# Patient Record
Sex: Male | Born: 1950 | Race: White | Hispanic: No | Marital: Married | Smoking: Never smoker
Health system: Southern US, Community
[De-identification: ages and names within clinical notes are randomized; demographics above are authoritative.]

## PROBLEM LIST (undated history)

## (undated) DIAGNOSIS — Z8719 Personal history of other diseases of the digestive system: Secondary | ICD-10-CM

## (undated) DIAGNOSIS — J189 Pneumonia, unspecified organism: Secondary | ICD-10-CM

## (undated) DIAGNOSIS — Z9189 Other specified personal risk factors, not elsewhere classified: Secondary | ICD-10-CM

## (undated) DIAGNOSIS — F419 Anxiety disorder, unspecified: Secondary | ICD-10-CM

## (undated) DIAGNOSIS — H269 Unspecified cataract: Secondary | ICD-10-CM

## (undated) DIAGNOSIS — L309 Dermatitis, unspecified: Secondary | ICD-10-CM

## (undated) DIAGNOSIS — J101 Influenza due to other identified influenza virus with other respiratory manifestations: Secondary | ICD-10-CM

## (undated) DIAGNOSIS — Z87442 Personal history of urinary calculi: Secondary | ICD-10-CM

## (undated) DIAGNOSIS — H9190 Unspecified hearing loss, unspecified ear: Secondary | ICD-10-CM

## (undated) DIAGNOSIS — M1990 Unspecified osteoarthritis, unspecified site: Secondary | ICD-10-CM

## (undated) DIAGNOSIS — E785 Hyperlipidemia, unspecified: Secondary | ICD-10-CM

## (undated) DIAGNOSIS — T7840XA Allergy, unspecified, initial encounter: Secondary | ICD-10-CM

## (undated) DIAGNOSIS — R600 Localized edema: Secondary | ICD-10-CM

## (undated) DIAGNOSIS — S61209A Unspecified open wound of unspecified finger without damage to nail, initial encounter: Secondary | ICD-10-CM

## (undated) DIAGNOSIS — Z972 Presence of dental prosthetic device (complete) (partial): Secondary | ICD-10-CM

## (undated) DIAGNOSIS — N201 Calculus of ureter: Secondary | ICD-10-CM

## (undated) DIAGNOSIS — S99912A Unspecified injury of left ankle, initial encounter: Secondary | ICD-10-CM

## (undated) DIAGNOSIS — R21 Rash and other nonspecific skin eruption: Secondary | ICD-10-CM

## (undated) HISTORY — PX: FINGER FRACTURE SURGERY: SHX638

## (undated) HISTORY — DX: Hyperlipidemia, unspecified: E78.5

## (undated) HISTORY — PX: COLONOSCOPY: SHX174

## (undated) HISTORY — DX: Anxiety disorder, unspecified: F41.9

## (undated) HISTORY — DX: Allergy, unspecified, initial encounter: T78.40XA

## (undated) HISTORY — PX: ROTATOR CUFF REPAIR: SHX139

## (undated) HISTORY — PX: NASAL FRACTURE SURGERY: SHX718

## (undated) HISTORY — DX: Unspecified cataract: H26.9

## (undated) HISTORY — PX: ORIF ULNAR FRACTURE: SHX5417

## (undated) HISTORY — DX: Dermatitis, unspecified: L30.9

## (undated) HISTORY — PX: JOINT REPLACEMENT: SHX530

---

## 1998-12-26 ENCOUNTER — Encounter: Payer: Self-pay | Admitting: Family Medicine

## 1998-12-26 ENCOUNTER — Inpatient Hospital Stay (HOSPITAL_COMMUNITY): Admission: EM | Admit: 1998-12-26 | Discharge: 1998-12-27 | Payer: Self-pay | Admitting: Emergency Medicine

## 2001-04-01 ENCOUNTER — Encounter: Admission: RE | Admit: 2001-04-01 | Discharge: 2001-05-04 | Payer: Self-pay | Admitting: Family Medicine

## 2002-09-19 ENCOUNTER — Emergency Department (HOSPITAL_COMMUNITY): Admission: EM | Admit: 2002-09-19 | Discharge: 2002-09-19 | Payer: Self-pay | Admitting: Emergency Medicine

## 2005-09-18 ENCOUNTER — Ambulatory Visit: Payer: Self-pay | Admitting: Family Medicine

## 2005-09-29 ENCOUNTER — Ambulatory Visit: Payer: Self-pay | Admitting: Family Medicine

## 2005-10-23 ENCOUNTER — Ambulatory Visit: Payer: Self-pay | Admitting: Gastroenterology

## 2005-11-06 ENCOUNTER — Encounter (INDEPENDENT_AMBULATORY_CARE_PROVIDER_SITE_OTHER): Payer: Self-pay | Admitting: Specialist

## 2005-11-06 ENCOUNTER — Ambulatory Visit: Payer: Self-pay | Admitting: Gastroenterology

## 2005-11-26 ENCOUNTER — Ambulatory Visit: Payer: Self-pay | Admitting: Family Medicine

## 2005-12-03 ENCOUNTER — Emergency Department (HOSPITAL_COMMUNITY): Admission: EM | Admit: 2005-12-03 | Discharge: 2005-12-03 | Payer: Self-pay | Admitting: Emergency Medicine

## 2006-06-22 ENCOUNTER — Ambulatory Visit: Payer: Self-pay | Admitting: Family Medicine

## 2006-08-18 ENCOUNTER — Ambulatory Visit: Payer: Self-pay | Admitting: Family Medicine

## 2006-09-24 ENCOUNTER — Ambulatory Visit: Payer: Self-pay | Admitting: Family Medicine

## 2006-09-24 LAB — CONVERTED CEMR LAB
ALT: 41 units/L — ABNORMAL HIGH (ref 0–40)
AST: 28 units/L (ref 0–37)
Albumin: 4 g/dL (ref 3.5–5.2)
Alkaline Phosphatase: 45 units/L (ref 39–117)
BUN: 15 mg/dL (ref 6–23)
Bilirubin, Direct: 0.1 mg/dL (ref 0.0–0.3)
Calcium: 8.7 mg/dL (ref 8.4–10.5)
Chloride: 107 meq/L (ref 96–112)
Eosinophils Absolute: 0.3 10*3/uL (ref 0.0–0.6)
Eosinophils Relative: 4.3 % (ref 0.0–5.0)
GFR calc non Af Amer: 82 mL/min
Glucose, Bld: 100 mg/dL — ABNORMAL HIGH (ref 70–99)
HDL: 25.8 mg/dL — ABNORMAL LOW (ref >39.0)
MCV: 93.9 fL (ref 78.0–100.0)
Monocytes Relative: 10.3 % (ref 3.0–11.0)
Platelets: 260 10*3/uL (ref 150–400)
RBC: 4.63 M/uL (ref 4.22–5.81)
TSH: 2.03 microintl units/mL (ref 0.35–5.50)
Triglycerides: 245 mg/dL (ref 0–149)
VLDL: 49 mg/dL — ABNORMAL HIGH (ref 0–40)
WBC: 6.4 10*3/uL (ref 4.5–10.5)

## 2006-10-01 ENCOUNTER — Ambulatory Visit: Payer: Self-pay | Admitting: Family Medicine

## 2006-12-03 ENCOUNTER — Ambulatory Visit: Payer: Self-pay | Admitting: Family Medicine

## 2007-01-27 ENCOUNTER — Ambulatory Visit (HOSPITAL_COMMUNITY): Admission: RE | Admit: 2007-01-27 | Discharge: 2007-01-28 | Payer: Self-pay | Admitting: Orthopedic Surgery

## 2007-01-27 HISTORY — PX: ANTERIOR CERVICAL DECOMP/DISCECTOMY FUSION: SHX1161

## 2007-03-16 ENCOUNTER — Encounter: Admission: RE | Admit: 2007-03-16 | Discharge: 2007-04-04 | Payer: Self-pay | Admitting: Orthopedic Surgery

## 2007-04-12 ENCOUNTER — Telehealth: Payer: Self-pay | Admitting: Family Medicine

## 2007-04-14 ENCOUNTER — Ambulatory Visit: Payer: Self-pay | Admitting: Family Medicine

## 2007-04-15 ENCOUNTER — Ambulatory Visit: Payer: Self-pay | Admitting: Family Medicine

## 2007-06-24 ENCOUNTER — Ambulatory Visit: Payer: Self-pay | Admitting: Family Medicine

## 2007-06-24 DIAGNOSIS — J309 Allergic rhinitis, unspecified: Secondary | ICD-10-CM | POA: Insufficient documentation

## 2007-07-08 ENCOUNTER — Ambulatory Visit: Payer: Self-pay | Admitting: Family Medicine

## 2007-07-08 DIAGNOSIS — F411 Generalized anxiety disorder: Secondary | ICD-10-CM | POA: Insufficient documentation

## 2007-07-08 LAB — CONVERTED CEMR LAB
Albumin: 4.3 g/dL (ref 3.5–5.2)
Alkaline Phosphatase: 45 units/L (ref 39–117)
BUN: 7 mg/dL (ref 6–23)
Basophils Absolute: 0.1 10*3/uL (ref 0.0–0.1)
Bilirubin Urine: NEGATIVE
Blood in Urine, dipstick: NEGATIVE
Chloride: 107 meq/L (ref 96–112)
Cholesterol: 230 mg/dL (ref 0–200)
Creatinine, Ser: 1 mg/dL (ref 0.4–1.5)
Direct LDL: 173 mg/dL
Eosinophils Absolute: 0.3 10*3/uL (ref 0.0–0.6)
GFR calc non Af Amer: 82 mL/min
Glucose, Urine, Semiquant: NEGATIVE
HDL: 32.7 mg/dL — ABNORMAL LOW (ref >39.0)
Ketones, urine, test strip: NEGATIVE
MCHC: 33.9 g/dL (ref 30.0–36.0)
MCV: 95.6 fL (ref 78.0–100.0)
Monocytes Absolute: 0.6 10*3/uL (ref 0.2–0.7)
Monocytes Relative: 7.8 % (ref 3.0–11.0)
Neutrophils Relative %: 73.4 % (ref 43.0–77.0)
PSA: 0.25 ng/mL (ref 0.10–4.00)
Potassium: 4.6 meq/L (ref 3.5–5.1)
Protein, U semiquant: NEGATIVE
RBC: 5.03 M/uL (ref 4.22–5.81)
RDW: 12.5 % (ref 11.5–14.6)
Sodium: 141 meq/L (ref 135–145)
Specific Gravity, Urine: 1.02
Total Bilirubin: 1.2 mg/dL (ref 0.3–1.2)
Total CHOL/HDL Ratio: 7
Triglycerides: 154 mg/dL — ABNORMAL HIGH (ref 0–149)
pH: 6

## 2007-11-08 DIAGNOSIS — N200 Calculus of kidney: Secondary | ICD-10-CM | POA: Insufficient documentation

## 2007-11-10 ENCOUNTER — Ambulatory Visit: Payer: Self-pay | Admitting: Family Medicine

## 2007-11-10 DIAGNOSIS — Z87442 Personal history of urinary calculi: Secondary | ICD-10-CM | POA: Insufficient documentation

## 2007-11-10 LAB — CONVERTED CEMR LAB
Glucose, Urine, Semiquant: NEGATIVE
Ketones, urine, test strip: NEGATIVE
Specific Gravity, Urine: 1.015
Urobilinogen, UA: 0.2
WBC Urine, dipstick: NEGATIVE
pH: 6

## 2007-11-14 LAB — CONVERTED CEMR LAB
Calcium: 9.3 mg/dL (ref 8.4–10.5)
GFR calc Af Amer: 99 mL/min
GFR calc non Af Amer: 82 mL/min
Phosphorus: 3.5 mg/dL (ref 2.3–4.6)
Potassium: 4.4 meq/L (ref 3.5–5.1)
Sodium: 135 meq/L (ref 135–145)
Uric Acid, Serum: 7.2 mg/dL (ref 4.0–7.8)

## 2007-12-01 ENCOUNTER — Ambulatory Visit: Payer: Self-pay | Admitting: Family Medicine

## 2007-12-01 LAB — CONVERTED CEMR LAB
ALT: 34 units/L (ref 0–53)
AST: 27 units/L (ref 0–37)
Albumin: 4.2 g/dL (ref 3.5–5.2)
BUN: 13 mg/dL (ref 6–23)
Basophils Relative: 0.7 % (ref 0.0–3.0)
Blood in Urine, dipstick: NEGATIVE
CO2: 27 meq/L (ref 19–32)
Calcium: 9 mg/dL (ref 8.4–10.5)
Chloride: 104 meq/L (ref 96–112)
Creatinine, Ser: 1.1 mg/dL (ref 0.4–1.5)
Direct LDL: 156.4 mg/dL
Eosinophils Absolute: 0.2 10*3/uL (ref 0.0–0.7)
Eosinophils Relative: 3.3 % (ref 0.0–5.0)
Glucose, Urine, Semiquant: NEGATIVE
HDL: 29.2 mg/dL — ABNORMAL LOW (ref >39.0)
Hemoglobin: 16.2 g/dL (ref 13.0–17.0)
Lymphocytes Relative: 19 % (ref 12.0–46.0)
MCV: 96.4 fL (ref 78.0–100.0)
Neutro Abs: 4.4 10*3/uL (ref 1.4–7.7)
Neutrophils Relative %: 67.7 % (ref 43.0–77.0)
Nitrite: NEGATIVE
RBC: 4.71 M/uL (ref 4.22–5.81)
Specific Gravity, Urine: 1.02
WBC Urine, dipstick: NEGATIVE
WBC: 6.4 10*3/uL (ref 4.5–10.5)

## 2007-12-08 ENCOUNTER — Ambulatory Visit: Payer: Self-pay | Admitting: Family Medicine

## 2007-12-08 DIAGNOSIS — L301 Dyshidrosis [pompholyx]: Secondary | ICD-10-CM | POA: Insufficient documentation

## 2007-12-08 DIAGNOSIS — J45909 Unspecified asthma, uncomplicated: Secondary | ICD-10-CM | POA: Insufficient documentation

## 2008-10-04 ENCOUNTER — Ambulatory Visit: Payer: Self-pay | Admitting: Family Medicine

## 2008-10-19 ENCOUNTER — Ambulatory Visit: Payer: Self-pay | Admitting: Family Medicine

## 2008-12-11 ENCOUNTER — Ambulatory Visit: Payer: Self-pay | Admitting: Family Medicine

## 2008-12-11 LAB — CONVERTED CEMR LAB
ALT: 33 units/L (ref 0–53)
Basophils Relative: 0.2 % (ref 0.0–3.0)
Bilirubin, Direct: 0.1 mg/dL (ref 0.0–0.3)
Chloride: 106 meq/L (ref 96–112)
Creatinine, Ser: 1.1 mg/dL (ref 0.4–1.5)
Eosinophils Relative: 2.6 % (ref 0.0–5.0)
HCT: 44.4 % (ref 39.0–52.0)
MCV: 95.8 fL (ref 78.0–100.0)
Monocytes Absolute: 0.3 10*3/uL (ref 0.1–1.0)
Neutrophils Relative %: 82.3 % — ABNORMAL HIGH (ref 43.0–77.0)
Nitrite: NEGATIVE
PSA: 0.32 ng/mL (ref 0.10–4.00)
Potassium: 4.7 meq/L (ref 3.5–5.1)
Protein, U semiquant: NEGATIVE
RBC: 4.63 M/uL (ref 4.22–5.81)
Sodium: 141 meq/L (ref 135–145)
Total CHOL/HDL Ratio: 6
Total Protein: 7.1 g/dL (ref 6.0–8.3)
Urobilinogen, UA: 0.2
VLDL: 29.6 mg/dL (ref 0.0–40.0)
WBC Urine, dipstick: NEGATIVE
WBC: 8.1 10*3/uL (ref 4.5–10.5)

## 2008-12-17 ENCOUNTER — Ambulatory Visit: Payer: Self-pay | Admitting: Family Medicine

## 2008-12-17 DIAGNOSIS — H833X9 Noise effects on inner ear, unspecified ear: Secondary | ICD-10-CM | POA: Insufficient documentation

## 2009-01-23 ENCOUNTER — Ambulatory Visit: Payer: Self-pay | Admitting: Family Medicine

## 2009-01-23 ENCOUNTER — Ambulatory Visit: Payer: Self-pay | Admitting: Cardiovascular Disease

## 2009-01-23 LAB — CONVERTED CEMR LAB
Blood in Urine, dipstick: NEGATIVE
Ketones, urine, test strip: NEGATIVE
Nitrite: NEGATIVE
Protein, U semiquant: NEGATIVE
Urobilinogen, UA: 1

## 2009-05-06 ENCOUNTER — Ambulatory Visit: Payer: Self-pay | Admitting: Family Medicine

## 2009-06-06 ENCOUNTER — Ambulatory Visit: Payer: Self-pay | Admitting: Family Medicine

## 2009-06-06 ENCOUNTER — Telehealth: Payer: Self-pay | Admitting: Family Medicine

## 2009-08-17 ENCOUNTER — Ambulatory Visit: Payer: Self-pay | Admitting: Family Medicine

## 2009-08-17 ENCOUNTER — Telehealth: Payer: Self-pay | Admitting: Family Medicine

## 2009-08-17 DIAGNOSIS — R319 Hematuria, unspecified: Secondary | ICD-10-CM | POA: Insufficient documentation

## 2009-08-17 LAB — CONVERTED CEMR LAB
Bilirubin Urine: NEGATIVE
Glucose, Urine, Semiquant: NEGATIVE
Ketones, urine, test strip: NEGATIVE
Specific Gravity, Urine: 1.015

## 2009-10-07 ENCOUNTER — Telehealth: Payer: Self-pay | Admitting: Family Medicine

## 2010-01-09 ENCOUNTER — Ambulatory Visit: Payer: Self-pay | Admitting: Family Medicine

## 2010-01-09 LAB — CONVERTED CEMR LAB
ALT: 38 units/L (ref 0–53)
BUN: 13 mg/dL (ref 6–23)
Basophils Absolute: 0.1 10*3/uL (ref 0.0–0.1)
Bilirubin Urine: NEGATIVE
Bilirubin, Direct: 0.2 mg/dL (ref 0.0–0.3)
Blood in Urine, dipstick: NEGATIVE
Chloride: 106 meq/L (ref 96–112)
Cholesterol: 203 mg/dL — ABNORMAL HIGH (ref 0–200)
Creatinine, Ser: 1 mg/dL (ref 0.4–1.5)
Direct LDL: 155.6 mg/dL
Eosinophils Relative: 3.2 % (ref 0.0–5.0)
GFR calc non Af Amer: 82.72 mL/min (ref >60)
Glucose, Bld: 97 mg/dL (ref 70–99)
Glucose, Urine, Semiquant: NEGATIVE
HDL: 39.2 mg/dL (ref >39.00)
Lymphs Abs: 1.6 10*3/uL (ref 0.7–4.0)
MCV: 96.3 fL (ref 78.0–100.0)
Monocytes Absolute: 0.7 10*3/uL (ref 0.1–1.0)
Neutrophils Relative %: 66.7 % (ref 43.0–77.0)
PSA: 0.23 ng/mL (ref 0.10–4.00)
Platelets: 206 10*3/uL (ref 150.0–400.0)
Protein, U semiquant: NEGATIVE
RDW: 13.5 % (ref 11.5–14.6)
Specific Gravity, Urine: 1.025
TSH: 2.38 microintl units/mL (ref 0.35–5.50)
Total Bilirubin: 0.8 mg/dL (ref 0.3–1.2)
VLDL: 25.2 mg/dL (ref 0.0–40.0)
WBC: 8 10*3/uL (ref 4.5–10.5)
pH: 5

## 2010-01-16 ENCOUNTER — Encounter: Payer: Self-pay | Admitting: Family Medicine

## 2010-01-16 ENCOUNTER — Ambulatory Visit: Payer: Self-pay | Admitting: Family Medicine

## 2010-01-31 ENCOUNTER — Ambulatory Visit: Payer: Self-pay | Admitting: Family Medicine

## 2010-05-27 NOTE — Assessment & Plan Note (Signed)
Summary: ALER/REC TO PNEU SHOT//    History of Present Illness: [name] is a 60 year old male, who comes in today for evaluation of soreness in his Om after a Pneumovax injection.  I gave him a Pneumovax in his left arm.  Yesterday.  He said last night.  His arm became red and hot and swollen.  He comes in today for evaluation.  He said Pneumovax injections in the past, but never had a reaction like this before.  Current Allergies: No known allergies       Physical Exam  General:     Well-developed,well-nourished,in no acute distress; alert,appropriate and cooperative throughout examination Skin:     examination her left arm shows erythema of the upper arm, extending down to the elbow.  It is tender and hot    Impression & Recommendations:  Problem # 1:  CELLULITIS AND ABSCESS OF OTHER SPECIFIED SITE [phone]) Assessment: New  Orders: No Charge Patient Arrived (NCPA0) (NCPA0)   Complete Medication List: 1)  Prednisone 20 Mg Tabs (Prednisone) .... Uad 2)  Flonase 50 Mcg/act Susp (Fluticasone propionate) .... Prn 3)  Viagra 50 Mg Tabs (Sildenafil citrate) .... Prn 4)  Adult Aspirin Low Strength 81 Mg Tbdp (Aspirin) 5)  Nostrilla 12 Hour 0.05 % Soln (Oxymetazoline hcl)   Patient Instructions: 1)  begin Keflex 500 mg two tabs b.i.d.  Use warm soaks.  Over the next two to 3 days.  The cellulitis had resolved.  If, however, does not air becomes worse.  It come directly to the hospital.  You might need to be admitted for IV antibiotics.    ]

## 2010-05-27 NOTE — Miscellaneous (Signed)
Summary: Directives and Consents  Directives and Consents   Imported By: Haskell Riling 08/17/2009 15:02:07  _____________________________________________________________________  External Attachment:    Type:   Image     Comment:   External Document

## 2010-05-27 NOTE — Assessment & Plan Note (Signed)
Summary: cpx//ccm   Vital Signs:  Patient profile:   60 year old male Height:      69.25 inches Weight:      240 pounds BMI:     35.31 Temp:     98.4 degrees F oral BP sitting:   130 / 90  (left arm) Cuff size:   regular  Vitals Entered By: Kern Reap CMA Duncan Dull) (January 16, 2010 10:37 AM) CC: cpx Is Patient Diabetic? No Pain Assessment Patient in pain? no        CC:  cpx.  History of Present Illness: [name] is a 60 year old male, married male, nonsmoker, who comes in today for evaluation of multiple issues.  He has a history of underlying allergic rhinitis, for which he uses Flonase nasal spray.  He also has asthma, for which he uses Qvar 41-cup b.i.d. and p.r.n. albuterol.  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  We tried to taper his medication down, but every time we get below 10 mg a day.  His hands cracked bleed.  He's also usingudder cream and white cotton gloves at bedtime.  He said some hearing loss and would like a referral for an audiogram.  Weight is said to 40.  He get his weight down via diet and exercise in the past.  In June.  He had a kidney stone passed spontaneously.  He's had recurrent stones.  History GI care, dental care, colonoscopy, normal, it is 2006, Pneumovax 2009, seasonal flu shot today  Allergies: No Known Drug Allergies  Past History:  Past medical, surgical, family and social histories (including risk factors) reviewed, and no changes noted (except as noted below).  Past Medical History: Reviewed history from 12/08/2007 and no changes required. High Cholesterol Allergic rhinitis Anxiety Nephrolithiasis, hx of Asthma eczema  Past Surgical History: Reviewed history from 01/10/2007 and no changes required. Colonoscopy  Family History: Reviewed history from 01/10/2007 and no changes required. Family History of Arthritis Family History Hypertension Family History Osteoporosis Family History  of Cardiovascular disorder Family History of Respiratory disease  Social History: Reviewed history from 01/10/2007 and no changes required. Occupation: Married Never Smoked Alcohol use-yes  Review of Systems      See HPI       Flu Vaccine Consent Questions     Do you have a history of severe allergic reactions to this vaccine? no    Any prior history of allergic reactions to egg and/or gelatin? no    Do you have a sensitivity to the preservative Thimersol? no    Do you have a past history of Guillan-Barre Syndrome? no    Do you currently have an acute febrile illness? no    Have you ever had a severe reaction to latex? no    Vaccine information given and explained to patient? yes    Are you currently pregnant? no    Lot Number:[id number]   Exp Date:10/25/2010   Site Given  Left Deltoid IM   Physical Exam  General:  Well-developed,well-nourished,in no acute distress; alert,appropriate and cooperative throughout examination Head:  Normocephalic and atraumatic without obvious abnormalities. No apparent alopecia or balding. Eyes:  No corneal or conjunctival inflammation noted. EOMI. Perrla. Funduscopic exam benign, without hemorrhages, exudates or papilledema. Vision grossly normal. Ears:  External ear exam shows no significant lesions or deformities.  Otoscopic examination reveals clear canals, tympanic membranes are intact bilaterally without bulging, retraction, inflammation or discharge. Hearing is grossly normal bilaterally. Nose:  External nasal examination shows no deformity or inflammation. Nasal mucosa are pink and moist without lesions or exudates. Mouth:  Oral mucosa and oropharynx without lesions or exudates.  Teeth in good repair. Neck:  No deformities, masses, or tenderness noted. Chest Wall:  No deformities, masses, tenderness or gynecomastia noted. Breasts:  No masses or gynecomastia noted Lungs:  Normal respiratory effort, chest expands symmetrically. Lungs are clear  to auscultation, no crackles or wheezes. Heart:  Normal rate and regular rhythm. S1 and S2 normal without gallop, murmur, click, rub or other extra sounds. Abdomen:  Bowel sounds positive,abdomen soft and non-tender without masses, organomegaly or hernias noted. Rectal:  No external abnormalities noted. Normal sphincter tone. No rectal masses or tenderness. Genitalia:  Testes bilaterally descended without nodularity, tenderness or masses. No scrotal masses or lesions. No penis lesions or urethral discharge. Prostate:  Prostate gland firm and smooth, no enlargement, nodularity, tenderness, mass, asymmetry or induration. Msk:  No deformity or scoliosis noted of thoracic or lumbar spine.   Pulses:  R and L carotid,radial,femoral,dorsalis pedis and posterior tibial pulses are full and equal bilaterally Extremities:  1+ left pedal edema and 1+ right pedal edema.  1+ left pedal edema.   Neurologic:  No cranial nerve deficits noted. Station and gait are normal. Plantar reflexes are down-going bilaterally. DTRs are symmetrical throughout. Sensory, motor and coordinative functions appear intact. Skin:  Intact without suspicious lesions or rashes Cervical Nodes:  No lymphadenopathy noted Axillary Nodes:  No palpable lymphadenopathy Inguinal Nodes:  No significant adenopathy Psych:  Cognition and judgment appear intact. Alert and cooperative with normal attention span and concentration. 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 2 puffs two times a day    Proair Hfa 108 (90 Base) Mcg/act Aers (Albuterol sulfate) .Marland Kitchen... 2 ps three times a day as needed    Prednisone 10 Mg Tabs (Prednisone) ..... Uad    Prednisone 20 Mg Tabs (Prednisone) ..... Use as directed  His updated medication list for this problem includes:    Qvar 40 Mcg/act Aers (Beclomethasone dipropionate) .Marland Kitchen... 2 puffs two times a day    Proair Hfa 108 (90 Base) Mcg/act Aers (Albuterol sulfate) .Marland Kitchen... 2 ps three times a day as needed    Prednisone 10 Mg Tabs (Prednisone) ..... Uad    Prednisone 20 Mg Tabs (Prednisone) ..... Use as directed  Orders: Prescription Created Electronically [phone])  Problem # 5:  ALLERGIC RHINITIS (ICD-477.9) Assessment: Improved  His updated medication list for this problem includes:    Flonase 50 Mcg/act Susp (Fluticasone propionate) .Marland Kitchen... Prn  His updated medication list for this problem includes:    Flonase 50 Mcg/act Susp (Fluticasone propionate) .Marland Kitchen... Prn  Orders: Prescription Created Electronically [phone])  Complete Medication List: 1)  Flonase 50 Mcg/act Susp (Fluticasone propionate) .... Prn 2)  Adult Aspirin Low Strength 81 Mg Tbdp (Aspirin) 3)  Demerol 50 Mg Tabs (Meperidine hcl) .Marland Kitchen.. 1 or 2 qid as needed pain 4)  Qvar 40 Mcg/act Aers (Beclomethasone dipropionate) .Marland KitchenMarland KitchenMarland Kitchen  2 puffs two times a day 5)  Proair Hfa 108 (90 Base) Mcg/act Aers (Albuterol sulfate) .... 2 ps three times a day as needed 6)  Lidex 0.05 % Crea (Fluocinonide) .... Apply at bedtime 7)  Prednisone 10 Mg Tabs (Prednisone) .... Uad 8)  Tumeric 500  .... Take one tab two times a day 9)  Hydromet 5-1.5 Mg/65ml Syrp (Hydrocodone-homatropine) .Marland Kitchen.. 1 or 2 tsps at bedtime as needed 10)  Alka-seltzer Plus Cold 2-7.8-325 Mg Tbef (Chlorphen-phenyleph-asa) .... As  directed 11)  Tussionex Pennkinetic Er 8-10 Mg/19ml Lqcr (Chlorpheniramine-hydrocodone) 12)  Prednisone 20 Mg Tabs (Prednisone) .... Use as directed  Other Orders: Admin 1st Vaccine ([address]) Flu Vaccine 35yrs + ([address])  Patient Instructions: 1)  Please schedule a follow-up appointment in 1 year. 2)  It is important that you exercise regularly at least 20 minutes 5 times a week. If you develop chest pain, have severe difficulty breathing, or feel very tired , stop exercising immediately and seek medical attention. 3)  You need to lose weight. Consider a lower calorie diet and regular exercise.  4)  Schedule a colonoscopy/sigmoidoscopy to help detect colon cancer. 5)  Take an Aspirin every day.call...Marland KitchenMarland KitchenMarland Kitchen [place] in her nose, and throat to make an appointment to see Aurther Loft.  The  audiologist 6)  try to alternate the prednisone by taking 10 mg one day and 5.  The next Prescriptions: DEMEROL 50 MG  TABS (MEPERIDINE HCL) 1 or 2 qid as needed pain  #30 x 0   Entered and Authorized by:   Roderick Pee MD   Signed by:   Roderick Pee MD on 01/16/2010   Method used:   Print then Give to Patient   RxID:   [id number] PREDNISONE 10 MG TABS (PREDNISONE) UAD  #100 x 3   Entered and Authorized by:   Roderick Pee MD   Signed by:   Roderick Pee MD on 01/16/2010   Method used:   Electronically to        [facility] [phone][facility] (retail)       [address] [place], [place]  [address]       Ph: [phone] or [phone]       Fax: [phone]   RxID:   [id number] PROAIR HFA 108 (90 BASE) MCG/ACT  AERS (ALBUTEROL SULFATE) 2 ps three times a day as needed  #1 x 1   Entered and Authorized by:   Roderick Pee MD   Signed by:   Roderick Pee MD on 01/16/2010   Method used:   Electronically to        [facility]  [address] [phone]* (retail)       [address] [place], [place]  [address]       Ph: [phone] or [phone]       Fax: [phone]   RxID:    [id number] QVAR 40 MCG/ACT  AERS (BECLOMETHASONE DIPROPIONATE) 2 puffs two times a day  #3 x 3   Entered and Authorized by:   Roderick Pee MD   Signed by:   Roderick Pee MD on 01/16/2010   Method used:   [facility]        [facility]  [address] [phone]       [address]  [place], [place]  [address]       Ph: [phone] or [phone]       Fax: [phone]   RxID:   [id number] FLONASE 50 MCG/ACT  SUSP (FLUTICASONE PROPIONATE) PRN  #3 x 3   Entered and Authorized by:   Roderick Pee MD   Signed by:   Roderick Pee MD on 01/16/2010   Method used:   [facility]ron[facility] to        [facility]  [address] [phone]* (retail)       [address] [place], [place]  [address]       Ph: [phone] or [phone]       Fax: [phone]   RxID:   [id number]

## 2010-05-27 NOTE — Assessment & Plan Note (Signed)
Summary: COugh/runny nose - greenish, ear pain - Both, headache x 4 dys r   Vital Signs:  Patient Profile:   60 Years Old Male CC:      Cold & URI symptoms Height:     69 inches Weight:      238 pounds O2 Sat:      97 % O2 treatment:    Room Air Temp:     97.9 degrees F oral Pulse rate:   70 / minute Pulse rhythm:   regular Resp:     15 per minute (right arm) Cuff size:   regular  Vitals Entered By: Areta Haber CMA (August 17, 2009 1:17 PM)                  Prior Medication List:  FLONASE 50 MCG/ACT  SUSP (FLUTICASONE PROPIONATE) PRN ADULT ASPIRIN LOW STRENGTH 81 MG  TBDP (ASPIRIN)  DEMEROL 50 MG  TABS (MEPERIDINE HCL) 1 or 2 qid as needed pain QVAR 40 MCG/ACT  AERS (BECLOMETHASONE DIPROPIONATE) 2 puffs two times a day PROAIR HFA 108 (90 BASE) MCG/ACT  AERS (ALBUTEROL SULFATE) 2 ps three times a day as needed LIDEX 0.05 % CREA (FLUOCINONIDE) apply at bedtime PREDNISONE 10 MG TABS (PREDNISONE) UAD * TUMERIC 500 take one tab two times a day HYDROMET 5-1.5 MG/5ML SYRP (HYDROCODONE-HOMATROPINE) 1 or 2 tsps at bedtime as needed   Current Allergies: No known allergies History of Present Illness Chief Complaint: Cold & URI symptoms History of Present Illness: patient started getting sick on Wednesday. He has been coughing wheezing some SOB. He has felt cold asnd has had had some diarrhea.  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.24) ALLERGIC RHINITIS (ICD-477.9) CELLULITIS AND ABSCESS OF OTHER SPECIFIED SITE [phone]) FAMILY  HISTORY OSTEOPOROSIS (ICD-V17.8) COUGH (ICD-786.2)   Current Meds FLONASE 50 MCG/ACT  SUSP (FLUTICASONE PROPIONATE) PRN ADULT ASPIRIN LOW STRENGTH 81 MG  TBDP (ASPIRIN)  DEMEROL 50 MG  TABS (MEPERIDINE HCL) 1 or 2 qid as needed pain QVAR 40 MCG/ACT  AERS (BECLOMETHASONE DIPROPIONATE) 2 puffs two times a day PROAIR HFA 108 (90 BASE) MCG/ACT  AERS (ALBUTEROL SULFATE) 2 ps three times a day as needed LIDEX 0.05 % CREA (FLUOCINONIDE) apply at bedtime PREDNISONE 10 MG TABS (PREDNISONE) UAD * TUMERIC 500 take one tab two times a day HYDROMET 5-1.5 MG/5ML SYRP (HYDROCODONE-HOMATROPINE) 1 or 2 tsps at bedtime as needed ALKA-SELTZER PLUS COLD 2-7.8-325 MG TBEF (CHLORPHEN-PHENYLEPH-ASA) as directed AUGMENTIN 875-125 MG TABS (AMOXICILLIN-POT CLAVULANATE) 1 by mouth 2 times daily TUSSIONEX PENNKINETIC ER 8-10 MG/5ML LQCR (CHLORPHENIRAMINE-HYDROCODONE)   REVIEW OF SYSTEMS Constitutional Symptoms       Complains of fever.     Denies chills, night sweats, weight loss, weight gain, and fatigue.  Eyes       Denies change in vision, eye pain, eye discharge, glasses, contact lenses, and eye surgery. Ear/Nose/Throat/Mouth       Complains of ear pain and hoarseness.      Denies hearing loss/aids, change in hearing, ear discharge, dizziness, frequent runny nose, frequent nose bleeds, sinus problems, sore throat, and tooth pain or bleeding.      Comments: x 4 dys Respiratory       Complains of productive cough.  Denies dry cough, wheezing, shortness of breath, asthma, bronchitis, and emphysema/COPD.  Cardiovascular       Denies murmurs, chest pain, and tires easily with exhertion.    Gastrointestinal       Denies stomach pain, nausea/vomiting, diarrhea, constipation, blood in bowel movements, and indigestion. Genitourniary       Denies painful urination, kidney stones, and loss of urinary control. Neurological       Complains of headaches.      Denies paralysis, seizures, and  fainting/blackouts. Musculoskeletal       Denies muscle pain, joint pain, joint stiffness, decreased range of motion, redness, swelling, muscle weakness, and gout.  Skin       Denies bruising, unusual mles/lumps or sores, and hair/skin or nail changes.  Psych       Denies mood changes, temper/anger issues, anxiety/stress, speech problems, depression, and sleep problems. Other Comments: greenish. Pt has not seen PCP for this   Past History:  Past Medical History: Last updated: 12/08/2007 High Cholesterol Allergic rhinitis Anxiety Nephrolithiasis, hx of Asthma eczema  Past Surgical History: Last updated: 01/10/2007 Colonoscopy  Family History: Last updated: 01/10/2007 Family History of Arthritis Family History Hypertension Family History Osteoporosis Family History of Cardiovascular disorder Family History of Respiratory disease  Social History: Last updated: 01/10/2007 Occupation: Married Never Smoked Alcohol use-yes  Risk Factors: Smoking Status: never (01/10/2007) Passive Smoke Exposure: no (12/03/2006)  Family History: Reviewed history from 01/10/2007 and no changes required. Family History of Arthritis Family History Hypertension Family History Osteoporosis Family History of Cardiovascular disorder Family History of Respiratory disease  Social History: Reviewed history from 01/10/2007 and no changes required. Occupation: Married Never Smoked Alcohol use-yes Physical Exam General appearance: well developed, well nourished,mild distress Head: normocephalic, atraumatic Oral/Pharynx: tongue normal, posterior pharynx without erythema or exudate Neck: supple,anterior lymphadenopathy present Chest/Lungs: scattered rhonchi poor movement of air Heart: regular rate and  rhythm, no murmur Abdomen: protuberennt  Back: mild  R CVA tenderness Skin: no obvious rashes or lesions MSE: oriented to time, place, and person Assessment New Problems: URINARY TRACT  INFECTION (ICD-599.0) HEMATURIA UNSPECIFIED (ICD-599.70) BRONCHITIS, ACUTE (ICD-466.0)  bronchitis  hematuria  Patient Education: Patient and/or caregiver instructed in the following: rest fluids and Tylenol.  Plan New Medications/Changes: Sandria Senter ER 8-10 MG/5ML LQCR (CHLORPHENIRAMINE-HYDROCODONE)  #4 floz x 0, 08/17/2009, Hassan Rowan MD AUGMENTIN [phone] MG TABS (AMOXICILLIN-POT CLAVULANATE) 1 by mouth 2 times daily  #20 x 0, 08/17/2009, Hassan Rowan MD  New Orders: New Patient Level IV [phone] Nebulizer Tx [phone] UA Dipstick w/o Micro (manual) [81002] T-Culture, Urine [[id number]] Solumedrol up to 125mg  [J2930] Albuterol Sulfate Sol 1mg  unit dose [J7613] Ipratropium inhalation sol. unit dose [H0865] Planning Comments:   as below  Follow Up: Follow up in 2-3 days if no improvement, Follow up with Primary Physician  The patient and/or caregiver has been counseled thoroughly with regard to medications prescribed including dosage, schedule, interactions, rationale for use, and possible side effects and they verbalize understanding.  Diagnoses and expected course of recovery discussed and will return if not improved as expected or if the condition worsens. Patient and/or caregiver verbalized understanding.  Prescriptions: Sandria Senter ER 8-10 MG/5ML LQCR (CHLORPHENIRAMINE-HYDROCODONE)   #4 floz x 0   Entered and Authorized by:   Hassan Rowan MD   Signed by:   Hassan Rowan MD on 08/17/2009   Method used:   Printed then faxed to ...       [facility]  [address] [phone][facility] (retail[facility]  [address] [place]  [address]       Ph: [phone] or [phone]       Fax: [phone]   RxID:   [id number] AUGMENTIN 875-125 MG TABS (AMOXICILLIN-POT CLAVULANATE) 1 by mouth 2 times daily  #20 x 0   Entered and Authorized by:   Hassan Rowan MD   Signed by:   Hassan Rowan MD on 08/17/2009   Method used:   Printed then faxed to ...       [facility]  [address]  [phone]* (retail)       [address] [place]  [address]       Ph: [phone] or [phone]       Fax: [phone]   RxID:   [id number]   Patient Instructions: 1)  Please schedule an appointment with your primary doctor in :7-10 days to make sure hematuria has resolved. unless while straining urine a stone is recovered.  2)  Hematuria needs to be checked on to mae there uis no other serious medical problems. 3)  If pain gets worse a kidney stone may be present and you need to go to the local Ed for futher evaluation. 4)  Take your antibiotic as prescribed until ALL of it is gone, but stop if you develop a rash or swelling and contact our office as soon as possible. 5)  Acute bronchitis symptoms for less than 10 days are not helped by antibiotics. take over the counter cough medications. call if no improvment in  5-7 days, sooner if increasing cough, fever, or new symptoms( shortness of breath, chest pain).  Laboratory Results   Urine Tests  Date/Time Received: August 17, 2009 1:37 PM  Date/Time Reported: August 17, 2009 1:37 PM   Routine Urinalysis   Color: yellow Appearance: Clear Glucose: negative   (Normal Range: Negative) Bilirubin: negative   (Normal Range: Negative) Ketone: negative   (Normal Range: Negative) Spec. Gravity: 1.015   (Normal Range: 1.003-1.035) Blood: small   (Normal Range: Negative) pH: 6.0   (Normal Range: 5.0-8.0) Protein: negative   (Normal Range: Negative) Urobilinogen: 0.2   (Normal Range: 0-1) Nitrite: negative   (Normal Range: Negative) Leukocyte Esterace: negative   (Normal Range: Negative)         Medication Administration  Injection # 1:    Medication: Solumedrol up to 125mg     Diagnosis: BRONCHITIS, ACUTE (ICD-466.0)    Route: IM    Site: LUOQ gluteus    Exp Date: 09/31/2013    Lot #: [id number]    Mfr: Pharmacia    Comments: Administered 125mg     Patient tolerated injection without complications    Given  by: Areta Haber CMA (August 17, 2009 4:00 PM)  Medication # 1:    Medication: Albuterol Sulfate Sol 1mg  unit dose    Diagnosis: BRONCHITIS, ACUTE (ICD-466.0)    Route: inhaled    Exp Date: 12/26/2010    Lot #: [id number]    Mfr: Nephron    Patient tolerated medication without complications    Given by: Areta Haber CMA (August 17, 2009 4:01 PM)  Medication # 2:    Medication: Ipratropium inhalation sol. unit dose    Diagnosis: BRONCHITIS, ACUTE (ICD-466.0)    Route: inhaled    Exp Date: 08/24/2009    Lot #: [id number]    Mfr:  Nephron    Patient tolerated medication without complications    Given by: Areta Haber CMA (August 17, 2009 4:02 PM)  Orders Added: 1)  New Patient Level IV [99204] 2)  Nebulizer Tx [94640] 3)  UA Dipstick w/o Micro (manual) [81002] 4)  T-Culture, Urine [[id number]] 5)  Solumedrol up to 125mg  [J2930] 6)  Albuterol Sulfate Sol 1mg  unit dose [J7613] 7)  Ipratropium inhalation sol. unit dose [X5284]

## 2010-05-27 NOTE — Assessment & Plan Note (Signed)
Summary: DRY,CRACKED,BLEDDING FINGERS   Vital Signs:  Patient profile:   60 year old male Weight:      241 pounds Temp:     98.6 degrees F oral BP sitting:   118 / 78  (left arm) Cuff size:   regular  Vitals Entered By: Kern Reap CMA Duncan Dull) (May 06, 2009 11:29 AM)  Reason for Visit hands dry cracked  History of Present Illness: [name] is a 60 year old male, married, nonsmoker, who comes in today for evaluation of severe eczema on his hands.  We have tried many different treatment options none of which worked for long periods of time.  On oral prednisone.  His hands will cover up rather quickly and stay clear for about a month and then a break down again.  We referred him to dermatology.  They have no other treatment options other than prednisone  Allergies: No Known Drug Allergies  Past History:  Past medical, surgical, family and social histories (including risk factors) reviewed for relevance to current acute and chronic problems.  Past Medical History: Reviewed history from 12/08/2007 and no changes required. High Cholesterol Allergic rhinitis Anxiety Nephrolithiasis, hx of Asthma eczema  Past Surgical History: Reviewed history from 01/10/2007 and no changes required. Colonoscopy  Family History: Reviewed history from 01/10/2007 and no changes required. Family History of Arthritis Family History Hypertension Family History Osteoporosis Family History of Cardiovascular disorder Family History of Respiratory disease  Social History: Reviewed history from 01/10/2007 and no changes required. Occupation: Married Never Smoked Alcohol use-yes  Review of Systems      See HPI  Physical Exam  General:  Well-developed,well-nourished,in no acute distress; alert,appropriate and cooperative throughout examination Skin:  the skin is red peeling, and there are cracks and fissures, over both hands   Impression & Recommendations:  Problem # 1:  DYSHIDROSIS  (ICD-705.81) Assessment Deteriorated  Orders: Prescription Created Electronically [phone])  Complete Medication List: 1)  Flonase 50 Mcg/act Susp (Fluticasone propionate) .... Prn 2)  Adult Aspirin Low Strength 81 Mg Tbdp (Aspirin) 3)  Demerol 50 Mg Tabs (Meperidine hcl) .Marland Kitchen.. 1 or 2 qid as needed pain 4)  Qvar 40 Mcg/act Aers (Beclomethasone dipropionate) .... 2 puffs two times a day 5)  Proair Hfa 108 (90 Base) Mcg/act Aers (Albuterol sulfate) .... 2 ps three times a day as needed 6)  Lidex 0.05 % Crea (Fluocinonide) .... Apply at bedtime 7)  Prednisone 10 Mg Tabs (Prednisone) .... Uad 8)  Tumeric 500  .... Take one tab two times a day  Patient Instructions: 1)  use a combination of  udder crm and Lidex nightly with white cotton gloves.  After your hands heal  continue the udder crm and white  gloves at bedtime 2)  restart the prednisone, take two tabs daily, and to your hands are completely 100% well ,  then take 10 mg......... one tablet..........Marland Kitchen Monday Wednesday, Friday, for 3 months Prescriptions: LIDEX 0.05 % CREA (FLUOCINONIDE) apply at bedtime  #60 gr x 2 x 6   Entered and Authorized by:   Roderick Pee MD   Signed by:   Roderick Pee MD on 05/06/2009   Method used:   [facility]        [facility]  [address] [phone][facility]ail[facility]       [address]       [place], [place]  [address]       Ph: [phone] or [phone]  Fax: [phone]   RxID:   [id number] PREDNISONE 10 MG TABS (PREDNISONE) UAD  #50 x 1   Entered and Authorized by:   Roderick Pee MD   Signed by:   Roderick Pee MD on 05/06/2009   Method used:   [facility]        [facility]  [address] [phone][facility] (retail)       [address]       [place], [place]  [address]       Ph: [phone] or [phone]       Fax: [phone]   RxID:   [id number]

## 2010-05-27 NOTE — Assessment & Plan Note (Signed)
Summary: cough/ha/njr   Vital Signs:  Patient profile:   60 year old male Weight:      232 pounds Temp:     98.8 degrees F oral BP sitting:   120 / 84  (left arm) Cuff size:   regular  Vitals Entered By: Kern Reap CMA Duncan Dull) (June 06, 2009 12:40 PM)  Reason for Visit head and chest congestion  History of Present Illness: [name] is a 60 year old, married male, nonsmoker, who comes in with a 4-day history of head congestion, runny nose, and cough.  He has a tendency to have asthma when he gets a bad cold.  He said the fever, sputum production, earache, nausea, vomiting, diarrhea, pain.  Aspirin dose of prednisone is 10 mg Monday, Wednesday, Friday.  because of severe eczema, which causes breakdown and infection in his hands.  His hands now on 10 mg Monday, Wednesday, Friday, are 95% improved  Allergies: No Known Drug Allergies  Past History:  Past medical, surgical, family and social histories (including risk factors) reviewed for relevance to current acute and chronic problems.  Past Medical History: Reviewed history from 12/08/2007 and no changes required. High Cholesterol Allergic rhinitis Anxiety Nephrolithiasis, hx of Asthma eczema  Past Surgical History: Reviewed history from 01/10/2007 and no changes required. Colonoscopy  Family History: Reviewed history from 01/10/2007 and no changes required. Family History of Arthritis Family History Hypertension Family History Osteoporosis Family History of Cardiovascular disorder Family History of Respiratory disease  Social History: Reviewed history from 01/10/2007 and no changes required. Occupation: Married Never Smoked Alcohol use-yes  Review of Systems      See HPI  Physical Exam  General:  Well-developed,well-nourished,in no acute distress; alert,appropriate and cooperative throughout examination Head:  Normocephalic and atraumatic without obvious abnormalities. No apparent alopecia or  balding. Eyes:  No corneal or conjunctival inflammation noted. EOMI. Perrla. Funduscopic exam benign, without hemorrhages, exudates or papilledema. Vision grossly normal. Ears:  External ear exam shows no significant lesions or deformities.  Otoscopic examination reveals clear canals, tympanic membranes are intact bilaterally without bulging, retraction, inflammation or discharge. Hearing is grossly normal bilaterally. Nose:  External nasal examination shows no deformity or inflammation. Nasal mucosa are pink and moist without lesions or exudates. Mouth:  Oral mucosa and oropharynx without lesions or exudates.  Teeth in good repair. Neck:  No deformities, masses, or tenderness noted. Chest Wall:  No deformities, masses, tenderness or gynecomastia noted. Lungs:  symmetrical breath sounds, late expiratory wheezing   Impression & Recommendations:  Problem # 1:  ASTHMA (ICD-493.90) Assessment Deteriorated  His updated medication list for this problem includes:    Qvar 40 Mcg/act Aers (Beclomethasone dipropionate) .Marland Kitchen... 2 puffs two times a day    Proair Hfa 108 (90 Base) Mcg/act Aers (Albuterol sulfate) .Marland Kitchen... 2 ps three times a day as needed    Prednisone 10 Mg Tabs (Prednisone) ..... Uad  Orders: Prescription Created Electronically [phone])  Complete Medication List: 1)  Flonase 50 Mcg/act Susp (Fluticasone propionate) .... Prn 2)  Adult Aspirin Low Strength 81 Mg Tbdp (Aspirin) 3)  Demerol 50 Mg Tabs (Meperidine hcl) .Marland Kitchen.. 1 or 2 qid as needed pain 4)  Qvar 40 Mcg/act Aers (Beclomethasone dipropionate) .... 2 puffs two times a day 5)  Proair Hfa 108 (90 Base) Mcg/act Aers (Albuterol sulfate) .... 2 ps three times a day as needed 6)  Lidex 0.05 % Crea (Fluocinonide) .... Apply at bedtime 7)  Prednisone 10 Mg Tabs (Prednisone) .... Uad 8)  Tumeric 500  .Marland KitchenMarland KitchenMarland Kitchen  Take one tab two times a day 9)  Hydromet 5-1.5 Mg/3ml Syrp (Hydrocodone-homatropine) .Marland Kitchen.. 1 or 2 tsps at bedtime as needed  Patient  Instructions: 1)  drink 30 ounces of water daily, run a vaporizer or humidifier in your bedroom at night, began Qvar two puffs twice a day, and prior to puffs 4 times a day as needed. 2)  Take one or 2 teaspoons of Hydromet at bedtime as needed for nighttime cough.  Return p.r.n. Prescriptions: PROAIR HFA 108 (90 BASE) MCG/ACT  AERS (ALBUTEROL SULFATE) 2 ps three times a day as needed  #1 x 1   Entered and Authorized by:   Roderick Pee MD   Signed by:   Roderick Pee MD on 06/06/2009   Method used:   [facility]        [facility]  [address] [phone][facility]       [address] [place], [place]  [address]       Ph: [phone] or [phone]       Fax: [phone]   RxID:   [id number] HYDROMET 5-1.5 MG/5ML SYRP (HYDROCODONE-HOMATROPINE) 1 or 2 tsps at bedtime as needed  #8oz x 1   Entered and Authorized by:   Roderick Pee MD   Signed by:   Roderick Pee MD on 06/06/2009   Method used:   Print then Give to Patient   RxID:   [id number]

## 2010-05-27 NOTE — Progress Notes (Signed)
Summary: rx change  Phone Note From Pharmacy   [facility]  [address] [phone][facility] Summary of Call: pharm is calling because the rx for pro air would be $40 more than ventolin.  Is this okay to fill? Initial call taken by: Kern Reap CMA Duncan Dull),  June 06, 2009 3:19 PM  Follow-up for Phone Call        ventolin ok Follow-up by: Roderick Pee MD,  June 06, 2009 5:19 PM  Additional Follow-up for Phone Call Additional follow up Details #1::        Pharmacist called Additional Follow-up by: Kern Reap CMA Duncan Dull),  June 06, 2009 5:43 PM

## 2010-05-27 NOTE — Progress Notes (Signed)
Summary: RX verification  Phone Note From Pharmacy   Summary of Call: [facility], needed clarification on directions for Tussinex, per Dr. Thurmond Butts, informed pharmacy 2 tablespoons 2x daily. Joanne Chars CMA  August 17, 2009 3:37 PM

## 2010-05-27 NOTE — Progress Notes (Signed)
Summary: Refill request for Predisone   Phone Note From Pharmacy   Summary of Call: Refill request for Prednisone 10 mg, Last office visit was 06/06/09 Initial call taken by: Kathrynn Speed CMA,  October 07, 2009 9:11 AM  Follow-up for Phone Call        okay......... dispense 50 tablets, use as directed, refills x 2...... 20 mg Follow-up by: Roderick Pee MD,  October 07, 2009 11:05 AM    New/Updated Medications: PREDNISONE 20 MG TABS (PREDNISONE) Use as directed Prescriptions: PREDNISONE 20 MG TABS (PREDNISONE) Use as directed  #50 x 2   Entered by:   Kathrynn Speed CMA   Authorized by:   Roderick Pee MD   Signed by:   Kathrynn Speed CMA on 10/07/2009   Method used:   [facility]        [facility]  [address] [phone]* (retail)       [address] [place], [place]  [address]       Ph: [phone] or [phone]       Fax: [phone]   RxID:   [id number]

## 2010-05-27 NOTE — Assessment & Plan Note (Signed)
Summary: Congestion/sore throat/cb   Vital Signs:  Patient profile:   60 year old male Height:      69.25 inches (175.90 cm) Weight:      242 pounds (110.00 kg) O2 Sat:      98 % on Room air Temp:     98.2 degrees F (36.78 degrees C) oral Pulse rate:   107 / minute BP sitting:   120 / 80  (left arm) Cuff size:   regular  Vitals Entered By: Josph Macho RMA (January 31, 2010 4:22 PM)  O2 Flow:  Room air CC: Chest congestion, sore throat X5 days/ CF Is Patient Diabetic? No   History of Present Illness: patient is a 60 year old Caucasian male in today with a 5 day history of worsening congestion and sore throat. He reports some intermittent low-grade fevers and chills malaise myalgias. His sore throat has been worsening over several days his nasal congestion is productive of green phlegm at times. He has headache and facial pressure denies ear pain. His cough is irritated and intermittently productive. He is having some chest tightness and discomfort with coughing at times waking up with dyspnea and difficulty breathing this morning. Has not been using his Qvar or his albuterol with any regularity during this flare. Has started Zyrtec daily and does feel like the first day or 2 to help with his nasal congestion somewhat but is now worsening once again. Denies nausea, vomiting, diarrhea, abdominal pain. No urinary complaints palpitations or other concerns today  Current Medications (verified): 1)  Flonase 50 Mcg/act  Susp (Fluticasone Propionate) .... Prn 2)  Adult Aspirin Low Strength 81 Mg  Tbdp (Aspirin) 3)  Demerol 50 Mg  Tabs (Meperidine Hcl) .Marland Kitchen.. 1 or 2 Qid As Needed Pain 4)  Qvar 40 Mcg/act  Aers (Beclomethasone Dipropionate) .... 2 Puffs Two Times A Day 5)  Proair Hfa 108 (90 Base) Mcg/act  Aers (Albuterol Sulfate) .... 2 Ps Three Times A Day As Needed 6)  Prednisone 10 Mg Tabs (Prednisone) .... Uad 7)  Zyrtec .... Once Daily  Allergies (verified): No Known Drug  Allergies  Past History:  Past medical history reviewed for relevance to current acute and chronic problems. Social history (including risk factors) reviewed for relevance to current acute and chronic problems.  Past Medical History: Reviewed history from 12/08/2007 and no changes required. High Cholesterol Allergic rhinitis Anxiety Nephrolithiasis, hx of Asthma eczema  Social History: Reviewed history from 01/10/2007 and no changes required. Occupation: Married Never Smoked Alcohol use-yes  Review of Systems      See HPI  Physical Exam  General:  Well-developed,well-nourished,in no acute distress; alert,appropriate and cooperative throughout examination Head:  Normocephalic and atraumatic without obvious abnormalities. No apparent alopecia or balding. Ears:  External ear exam shows no significant lesions or deformities.  Otoscopic examination reveals clear canals, tympanic membranes are intact bilaterally without bulging, retraction,  or discharge. Hearing is grossly normal bilaterally. TM on left mildly erythematous Nose:  mucosal erythema and mucosal edema.   Mouth:  pharyngeal erythema and mild edema no pustules Neck:  No deformities, masses, or tenderness noted. Lungs:  Normal respiratory effort, chest expands symmetrically. Lungs are clear to auscultation, no crackles or wheezes. slight rhonchi noted only at right base Heart:  Normal rate and regular rhythm. S1 and S2 normal without gallop, murmur, click, rub or other extra sounds. Abdomen:  Bowel sounds positive,abdomen soft and non-tender without masses, organomegaly or hernias noted. Obese Extremities:  No clubbing, cyanosis, edema, or  deformity noted with normal full range of motion of all joints.   Cervical Nodes:  No lymphadenopathy noted Psych:  Cognition and judgment appear intact. Alert and cooperative with normal attention span and concentration. No apparent delusions, illusions, hallucinations   Impression &  Recommendations:  Problem # 1:  BRONCHITIS, ACUTE (ICD-466.0)  The following medications were removed from the medication list:    Hydromet 5-1.5 Mg/54ml Syrp (Hydrocodone-homatropine) .Marland Kitchen... 1 or 2 tsps at bedtime as needed    Alka-seltzer Plus Cold 2-7.8-325 Mg Tbef (Chlorphen-phenyleph-asa) .Marland Kitchen... As directed    Tussionex Pennkinetic Er 8-10 Mg/32ml Lqcr (Chlorpheniramine-hydrocodone) His updated medication list for this problem includes:    Qvar 40 Mcg/act Aers (Beclomethasone dipropionate) .Marland Kitchen... 2 puffs two times a day start whenever any minor congestion develops    Proair Hfa 108 (90 Base) Mcg/act Aers (Albuterol sulfate) .Marland Kitchen... 2 puffs by mouth q 4 hours as needed cough/wheeze or sob    Cipro 500 Mg Tabs (Ciprofloxacin hcl) .Marland Kitchen... 1 tab by mouth two times a day x 10 days    Tussionex Pennkinetic Er 10-8 Mg/77ml Lqcr (Hydrocod polst-chlorphen polst) .Marland Kitchen... 1 tsp by mouth two times a day as needed cough Push clear fluids report if symptoms worsen  Complete Medication List: 1)  Flonase 50 Mcg/act Susp (Fluticasone propionate) .... Prn 2)  Adult Aspirin Low Strength 81 Mg Tbdp (Aspirin) 3)  Demerol 50 Mg Tabs (Meperidine hcl) .Marland Kitchen.. 1 or 2 qid as needed pain 4)  Qvar 40 Mcg/act Aers (Beclomethasone dipropionate) .... 2 puffs two times a day start whenever any minor congestion develops 5)  Proair Hfa 108 (90 Base) Mcg/act Aers (Albuterol sulfate) .... 2 puffs by mouth q 4 hours as needed cough/wheeze or sob 6)  Prednisone 10 Mg Tabs (Prednisone) .... Uad 7)  Zyrtec  .... Once daily 8)  Cipro 500 Mg Tabs (Ciprofloxacin hcl) .Marland Kitchen.. 1 tab by mouth two times a day x 10 days 9)  Tussionex Pennkinetic Er 10-8 Mg/51ml Lqcr (Hydrocod polst-chlorphen polst) .Marland Kitchen.. 1 tsp by mouth two times a day as needed cough  Patient Instructions: 1)  Please schedule a follow-up appointment as needed as needed if symptoms worsen or do not improve 2)  Take your antibiotic as prescribed until ALL of it is gone, but stop if you  develop a rash or swelling and contact our office as soon as possible.  3)  Acute Bronchitis symptoms for less then 10 days are not  helped by antibiotics. Take over the counter cough medications. Call if no improvement in 5-7 days, sooner if increasing cough, fever, or new symptoms ( shortness of breath, chest pain) .  Prescriptions: TUSSIONEX PENNKINETIC ER 10-8 MG/5ML LQCR (HYDROCOD POLST-CHLORPHEN POLST) 1 tsp by mouth two times a day as needed cough  #4oz x 1   Entered and Authorized by:   Danise Edge MD   Signed by:   Danise Edge MD on 01/31/2010   Method used:   Print then Give to Patient   RxID:   [id number] CIPRO 500 MG TABS (CIPROFLOXACIN HCL) 1 tab by mouth two times a day x 10 days  #20 x 0   Entered and Authorized by:   Danise Edge MD   Signed by:   Danise Edge MD on 01/31/2010   Method used:   [facility]        [facility]  [address] [phone][facility] (retail)       [address]  [place], [place]  [address]       Ph: [phone] or [phone]       Fax: [phone]   RxID:   [id number]

## 2010-06-20 ENCOUNTER — Encounter: Payer: Self-pay | Admitting: Internal Medicine

## 2010-06-20 ENCOUNTER — Ambulatory Visit (INDEPENDENT_AMBULATORY_CARE_PROVIDER_SITE_OTHER): Payer: 59 | Admitting: Internal Medicine

## 2010-06-20 DIAGNOSIS — IMO0002 Reserved for concepts with insufficient information to code with codable children: Secondary | ICD-10-CM

## 2010-06-20 DIAGNOSIS — S90851A Superficial foreign body, right foot, initial encounter: Secondary | ICD-10-CM

## 2010-06-20 MED ORDER — HYDROCODONE-ACETAMINOPHEN 5-500 MG PO TABS: 1.0000 | ORAL_TABLET | ORAL | Status: AC | PRN

## 2010-06-20 NOTE — Progress Notes (Signed)
  Subjective:    Patient ID: [name], male    DOB: Dec 08, 1950, 60 y.o.   MRN: [id number]  HPI  Pt presents to clinic for evaluation of foot pain. Notes 1 month h/o right plantar foot pain beginning after stepping on broken glass. Pt was able to remove several small pieces at the time of accident. Pain however has persisted has increased over last several days. Denies fever, chills or spontaneous drainage/discharge from site. BP elevated but currently in pain.  Reviewed PMH, medications and allergies.    Review of Systems  Constitutional: Negative for fever and chills.  Musculoskeletal: Negative for myalgias, joint swelling, arthralgias and gait problem.  Hematological: Does not bruise/bleed easily.       Objective:   Physical Exam  Constitutional: He appears well-developed and well-nourished. No distress.  HENT:  Head: Normocephalic and atraumatic.  Right Ear: External ear normal.  Left Ear: External ear normal.  Nose: Nose normal.  Eyes: Conjunctivae are normal. No scleral icterus.  Neurological: He is alert.  Skin: Skin is warm and dry. No rash noted. He is not diaphoretic. No cyanosis or erythema. No pallor. Nails show no clubbing.       Right plantar foot with small 3mm entrance wound without erythema or drainage. +tenderness to palpation. +surrounding thickened calloused skin. No foreign body to visual inspection.  Psychiatric: He has a normal mood and affect.          Assessment & Plan:

## 2010-06-20 NOTE — Assessment & Plan Note (Signed)
Discussed risks, benefits and potential complications of incision for removal of foreign body including but not limited to infection or bleeding. States understanding and wishes to proceed.   Area cleansed with betadine followed by alcohol. Using sterile scalpel blade and sterile forceps surrounding areas of dead skin are parred away. +visualization of small ~9mm dark hard FB. Area initially anesthesized with topical ethyl chloride spray followed by 2% lidocaine injected into wound. Multiple attempts to remove FB intitially unsuccessful. Ultimately FB failed to be visualized or felt on gentle probing. Applied topical abx ointment and wound dressed.   Monitor area for s/s of infection. As FB was not retrieved but appears to be absent on inspection advised patient to notify me if pain does not resolve which would be suggestive of continued presence of FB. In that setting would recommend podiatry referral. States agreement and understanding.

## 2010-08-07 ENCOUNTER — Other Ambulatory Visit: Payer: Self-pay | Admitting: *Deleted

## 2010-08-07 ENCOUNTER — Telehealth: Payer: Self-pay | Admitting: *Deleted

## 2010-08-07 MED ORDER — PREDNISONE 10 MG PO TABS: 10.0000 mg | ORAL_TABLET | Freq: Every day | ORAL | Status: DC

## 2010-08-07 NOTE — Telephone Encounter (Signed)
Rx clairification °

## 2010-09-09 NOTE — Op Note (Signed)
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   PROCEDURE:  Anterior cervical diskectomy and fusion C6-7.   HISTORY:  This is a very pleasant 60 year old gentleman who injured  himself and has had chronic severe right arm and neck pain for at least  2 years.  Initial radiographic analysis demonstrated a soft disk  herniation with hard disk osteophyte at C6-7 posterolateral right  causing compression of the C7 nerve root.  He also had some mild  degenerative changes of 4-5 and 5-6 with foraminal stenosis bilaterally.  However, clinically he only demonstrated triceps weakness and mild  triceps weakness on the right and C7 dermatomal pain.  Because even  though we had some early degenerative changes, I was concerned about the  possible need for multilevel cervical diskectomy and fusion but I  indicated to him that we will first evaluate the C6-7 disk and then if  needed to proceed to a multilevel.   OPERATIVE FINDINGS:  Demonstrated a significant posterolateral to the  right disk herniation with marked compression thecal sac and exiting C7  nerve root.  This was directly decompressed and excised to expose the  posterior thecal sac.  The posterior longitudinal ligament was also  resected.  After removing the disk fragment I could easily palpate with  a nerve hook behind the bodies of C6 and C7.  With an adequate  decompression and given his preoperative  clinical symptoms of C7  radicular pain only, I elected only to do the ACDF at C6-7.   OPERATIVE NOTE:  The patient was brought to the operating room, placed  supine on the operating table.  After successful induction of general  anesthesia and endotracheal intubation, TEDs, SCDs and Foley were  applied.  The arms were secured down at the side and rolled towel was  placed between the shoulder blades and the shoulders themselves were  taped down.  The anterior cervical spine was then prepped and draped in  standard fashion.  Straight and transverse incision was made at the C6-7  disk space at just below the level of thyroid cartilage.  Sharp  dissection was carried out down to and through the deep tissue and  platysma.  I then bluntly dissected along the medial border of the  sternocleidomastoid keeping carotid sheath lateral and the trachea,  esophagus medial once through the deep cervical fascia and prevertebral  fascia, I was able to palpate the anterior aspect of the cervical spine.  I then swept using Kitner pusher the trachea,  esophagus medially and  protected them with an appendiceal retractor.  I then marked the C6-7  disk space and took an x-ray.  Once I confirmed I was at the appropriate  level, I marked the disk with the Bovie and then began mobilizing longus  colli muscles bilaterally.  I exposed out to the uncovertebral joints  and exposed halfway up the vertebral body.  At this point with the  exposure complete, I then dropped the endotracheal cuff, placed the  Caspar retractors and expanded them and then reinflated the endotracheal  cuff.  I then placed distraction pins into the bodies of C6 and C7,  distracted the space and then incised the disk space with a 15 blade  scalpel, using a combination of pituitary rongeurs and curettes, I  resected the entire disk material.  On the right posterior lateral side  there was significant fragments of disk material that were behind  most  of the body of C6 which I gently manipulated.  Using a fine nerve hook  and #1 Kerrison rongeurs, I was able to tease the disk fragments out  from behind the vertebral body.  These were resected.  I then used a 2-  mm and 1-mm Kerrison to take down the posterior osteophytes at the  uncovertebral joint and at the posterior aspect of the vertebral bodies.  At this point I had an excellent anterior decompression bilaterally at  the neural foramen.  I could also visualize the anterior aspect of the  thecal sac.  I then resected the remaining posterior longitudinal  ligament going towards the left side with an 1-mm Kerrison.  At this  point I could see evidence of chronic compression to the thecal sac.  There was some mild petechial hemorrhaging noted.  There was no evidence  of any CSF leak.   I swept the nerve hook behind the bodies of C6 and C7 again confirming  that I could not palpate or visualize any retained fragments of disk  material.  At this point with a very adequate decompression completed I  then exposed the 5-6 disk space and examined it.  There was no  significant collapse of the disk space.  The height of the disk space is  still well maintained.  At this point, because all the symptoms  clinically were C7 and having found significant pathology affecting the  C7 nerve root, I elected not to expose the patient to prolonged surgery  and further morbidity with a two level fusion.  I then prepped the  endplates with a rasper, measured and placed an 8 lordotic precut  fibular allograft bone graft packed with DBX.  I had excellent position  and distraction.  I then did a 14 mm anterior cervical vector Synthes  plate secured it to the bodies of C6 and C7 was 16 mm screws.  There  were all locked in position and I took final AP and lateral x-rays which  demonstrated satisfactory overall position of screws and the graft.   At this point in time, I then irrigated the wound  copiously with normal  saline, obtained hemostasis using bipolar electrocautery then closed the  platysma with interrupted #1 Vicryl sutures and superficial with 3-0  Monocryl.  Steri-Strips, dry dressing were applied and hard collar was  then applied and the patient was extubated, transferred to PACU without  incident.  At the end of the case all needle and sponge counts were  correct.      Dahari  Sheela Stack, MD  Electronically Signed     DDB/MEDQ  D:  01/27/2007  T:  01/28/2007  Job:  [id number]

## 2010-12-03 ENCOUNTER — Encounter: Payer: Self-pay | Admitting: Family Medicine

## 2010-12-03 ENCOUNTER — Ambulatory Visit (INDEPENDENT_AMBULATORY_CARE_PROVIDER_SITE_OTHER): Payer: 59 | Admitting: Family Medicine

## 2010-12-03 VITALS — BP 130/80 | Temp 98.8°F | Wt 242.0 lb

## 2010-12-03 DIAGNOSIS — M25539 Pain in unspecified wrist: Secondary | ICD-10-CM

## 2010-12-03 DIAGNOSIS — M25532 Pain in left wrist: Secondary | ICD-10-CM

## 2010-12-03 MED ORDER — TRAMADOL HCL 50 MG PO TABS: ORAL_TABLET | ORAL | Status: DC

## 2010-12-03 NOTE — Patient Instructions (Signed)
Purchase a short arm splint and wear continuously.  Motrin 600 mg twice daily with food.  Elevation and ice 15 minutes 4 times daily.  Tramadol one or half a tablet at bedtime p.r.n. For severe pain.  If you don't see any improvement in the next 4 to 6 weeks for the pain gets worse.  I would recommend Dr. Cleone Slim Cypher ,,,,,,,,Hand  surgeon

## 2010-12-03 NOTE — Progress Notes (Signed)
  Subjective:    Patient ID: [name], male    DOB: 01-18-1951, 60 y.o.   MRN: [id number]  HPIalan Is a 60 year old male, married, nonsmoker, who comes in today for evaluation of pain in his left wrist for one month.  He states about a month ago.  He was working at home.  He lifted something heavy and felt a pop in his left wrist.  Since that time.  It is continuously sore not gotten any better.  He's tried home therapy with an ACE wrap.  Is also recently been taking prednisone for his eczema.      Review of Systems    General an orthopedic review of systems otherwise negative Objective:   Physical Exam Well-developed well-nourished man in no acute distress.  Examination left wrist shows no evidence of any swelling.  There's tenderness with flexion of the index finger at the extensor tendon at the base of the wrist.  Otherwise, hand appears normal       Assessment & Plan:  Tendinitis left wrist.  Plan elevation, ice, splint.  Motrin, and consult p.r.n.

## 2011-01-07 ENCOUNTER — Ambulatory Visit (INDEPENDENT_AMBULATORY_CARE_PROVIDER_SITE_OTHER): Payer: 59 | Admitting: Family Medicine

## 2011-01-07 ENCOUNTER — Encounter: Payer: Self-pay | Admitting: Family Medicine

## 2011-01-07 VITALS — BP 140/82 | Temp 98.4°F | Wt 244.0 lb

## 2011-01-07 DIAGNOSIS — R05 Cough: Secondary | ICD-10-CM

## 2011-01-07 DIAGNOSIS — R059 Cough, unspecified: Secondary | ICD-10-CM

## 2011-01-07 MED ORDER — BECLOMETHASONE DIPROPIONATE 40 MCG/ACT IN AERS: 2.0000 | INHALATION_SPRAY | Freq: Two times a day (BID) | RESPIRATORY_TRACT | Status: DC

## 2011-01-07 MED ORDER — CEFDINIR 300 MG PO CAPS: 300.0000 mg | ORAL_CAPSULE | Freq: Two times a day (BID) | ORAL | Status: AC

## 2011-01-07 NOTE — Progress Notes (Signed)
  Subjective:    Patient ID: [name], male    DOB: 05-Feb-1951, 60 y.o.   MRN: [id number]  HPI History of illness which started last week. Has sinus congestion and cough productive of thick yellow sputum. He has concerns because of asthma history. Currently does not have wheezing. Bifrontal headache. Sore throat past few days. Diffuse body aches. Denies nausea, vomiting, or diarrhea. He is concerned because a prior history of pneumonia. Nonsmoker.   Review of Systems  Constitutional: Positive for fatigue. Negative for fever and chills.  HENT: Positive for congestion, sore throat and sinus pressure.   Respiratory: Positive for cough. Negative for shortness of breath and wheezing.   Cardiovascular: Negative for chest pain.       Objective:   Physical Exam  Constitutional: He appears well-developed and well-nourished.  HENT:  Right Ear: External ear normal.  Left Ear: External ear normal.       Minimal posterior pharynx erythema without exudate  Neck: Neck supple.  Cardiovascular: Normal rate and regular rhythm.   Pulmonary/Chest: Effort normal and breath sounds normal. No respiratory distress. He has no wheezes. He has no rales.  Lymphadenopathy:    He has no cervical adenopathy.          Assessment & Plan:  Productive cough in asthmatic. We explained this may be all viral. Start Omnicef 300 mg twice daily if he has any fever or continued productive cough. Also provided further samples of his steroid inhaler. Return in one month for flu vaccine

## 2011-02-05 LAB — CBC
HCT: 46
Hemoglobin: 16
RBC: 4.96
RDW: 12.6
WBC: 6.5

## 2011-02-05 LAB — BASIC METABOLIC PANEL
Calcium: 9.3
GFR calc Af Amer: >60
GFR calc non Af Amer: >60
Glucose, Bld: 98
Potassium: 4.3
Sodium: 139

## 2011-02-05 LAB — URINALYSIS, ROUTINE W REFLEX MICROSCOPIC
Bilirubin Urine: NEGATIVE
Nitrite: NEGATIVE
Specific Gravity, Urine: 1.021
Urobilinogen, UA: 1
pH: 6

## 2011-02-05 LAB — PROTIME-INR: INR: 1

## 2011-02-05 LAB — APTT: aPTT: 29

## 2011-08-31 ENCOUNTER — Other Ambulatory Visit (INDEPENDENT_AMBULATORY_CARE_PROVIDER_SITE_OTHER): Payer: 59

## 2011-08-31 DIAGNOSIS — Z0000 Encounter for general adult medical examination without abnormal findings: Secondary | ICD-10-CM

## 2011-08-31 LAB — CBC WITH DIFFERENTIAL/PLATELET
Basophils Relative: 0.8 % (ref 0.0–3.0)
Eosinophils Relative: 6.5 % — ABNORMAL HIGH (ref 0.0–5.0)
HCT: 45.4 % (ref 39.0–52.0)
MCV: 96.6 fl (ref 78.0–100.0)
Monocytes Relative: 11.1 % (ref 3.0–12.0)
Neutrophils Relative %: 63 % (ref 43.0–77.0)
Platelets: 212 10*3/uL (ref 150.0–400.0)
RBC: 4.7 Mil/uL (ref 4.22–5.81)
WBC: 6.9 10*3/uL (ref 4.5–10.5)

## 2011-08-31 LAB — POCT URINALYSIS DIPSTICK
Bilirubin, UA: NEGATIVE
Blood, UA: NEGATIVE
Ketones, UA: NEGATIVE
Protein, UA: NEGATIVE
Spec Grav, UA: 1.025
pH, UA: 5.5

## 2011-08-31 LAB — HEPATIC FUNCTION PANEL
ALT: 36 U/L (ref 0–53)
AST: 25 U/L (ref 0–37)
Bilirubin, Direct: 0.1 mg/dL (ref 0.0–0.3)
Total Bilirubin: 0.9 mg/dL (ref 0.3–1.2)
Total Protein: 6.5 g/dL (ref 6.0–8.3)

## 2011-08-31 LAB — LIPID PANEL
HDL: 34.7 mg/dL — ABNORMAL LOW (ref >39.00)
LDL Cholesterol: 130 mg/dL — ABNORMAL HIGH (ref 0–99)
Total CHOL/HDL Ratio: 6
Triglycerides: 144 mg/dL (ref 0.0–149.0)

## 2011-08-31 LAB — BASIC METABOLIC PANEL
BUN: 16 mg/dL (ref 6–23)
Chloride: 104 mEq/L (ref 96–112)
Creatinine, Ser: 1.1 mg/dL (ref 0.4–1.5)
GFR: 69.1 mL/min (ref >60.00)
Potassium: 4.6 mEq/L (ref 3.5–5.1)

## 2011-09-03 ENCOUNTER — Telehealth: Payer: Self-pay | Admitting: Family Medicine

## 2011-09-03 ENCOUNTER — Ambulatory Visit (INDEPENDENT_AMBULATORY_CARE_PROVIDER_SITE_OTHER): Payer: 59 | Admitting: Family Medicine

## 2011-09-03 ENCOUNTER — Encounter: Payer: Self-pay | Admitting: Family Medicine

## 2011-09-03 ENCOUNTER — Other Ambulatory Visit: Payer: Self-pay | Admitting: Family Medicine

## 2011-09-03 DIAGNOSIS — L301 Dyshidrosis [pompholyx]: Secondary | ICD-10-CM

## 2011-09-03 DIAGNOSIS — F411 Generalized anxiety disorder: Secondary | ICD-10-CM

## 2011-09-03 DIAGNOSIS — Z0000 Encounter for general adult medical examination without abnormal findings: Secondary | ICD-10-CM

## 2011-09-03 DIAGNOSIS — J45909 Unspecified asthma, uncomplicated: Secondary | ICD-10-CM

## 2011-09-03 DIAGNOSIS — R609 Edema, unspecified: Secondary | ICD-10-CM

## 2011-09-03 DIAGNOSIS — R319 Hematuria, unspecified: Secondary | ICD-10-CM

## 2011-09-03 DIAGNOSIS — J309 Allergic rhinitis, unspecified: Secondary | ICD-10-CM

## 2011-09-03 DIAGNOSIS — Z87442 Personal history of urinary calculi: Secondary | ICD-10-CM

## 2011-09-03 DIAGNOSIS — H833X9 Noise effects on inner ear, unspecified ear: Secondary | ICD-10-CM

## 2011-09-03 MED ORDER — PREDNISONE 20 MG PO TABS: ORAL_TABLET | ORAL | Status: DC

## 2011-09-03 MED ORDER — FLUTICASONE PROPIONATE 50 MCG/ACT NA SUSP: 2.0000 | Freq: Every day | NASAL | Status: DC

## 2011-09-03 MED ORDER — HYDROCHLOROTHIAZIDE 25 MG PO TABS: 25.0000 mg | ORAL_TABLET | Freq: Every day | ORAL | Status: DC

## 2011-09-03 MED ORDER — PREDNISONE 10 MG PO TABS: 10.0000 mg | ORAL_TABLET | Freq: Every day | ORAL | Status: DC

## 2011-09-03 MED ORDER — ALBUTEROL SULFATE HFA 108 (90 BASE) MCG/ACT IN AERS: 2.0000 | INHALATION_SPRAY | Freq: Four times a day (QID) | RESPIRATORY_TRACT | Status: DC | PRN

## 2011-09-03 MED ORDER — HYDROCODONE-HOMATROPINE 5-1.5 MG/5ML PO SYRP: ORAL_SOLUTION | ORAL | Status: DC

## 2011-09-03 MED ORDER — BECLOMETHASONE DIPROPIONATE 40 MCG/ACT IN AERS: 2.0000 | INHALATION_SPRAY | Freq: Two times a day (BID) | RESPIRATORY_TRACT | Status: DC

## 2011-09-03 NOTE — Progress Notes (Signed)
  Subjective:    Patient ID: [name], male    DOB: 1951-01-24, 61 y.o.   MRN: [id number]  HPI Hessie Diener is a 61 year old married male nonsmoker who comes in today for physical examination  He has a history of chronic asthma and takes Qvar 40 dose 2 puffs twice a day and albuterol when necessary. He's been having to use his rescue inhaler recently  He also uses steroid nasal spray  He takes 10 mg of prednisone daily for chronic eczema of his hands and feet if he doesn't take that amount his hands Crack,  bleeding get infected He gets routine eye care, hearing loss high frequency recommended Johnnye Lana, regular dental care, tetanus 2006, Pneumovax x2, information given on shingles  He's using his inhalers despite that he's been wheezing for the past week    Review of Systems  Constitutional: Negative.   HENT: Negative.   Eyes: Negative.   Respiratory: Negative.   Cardiovascular: Negative.   Gastrointestinal: Negative.   Genitourinary: Negative.   Musculoskeletal: Negative.   Skin: Negative.   Neurological: Negative.   Hematological: Negative.   Psychiatric/Behavioral: Negative.        Objective:   Physical Exam  Constitutional: He is oriented to person, place, and time. He appears well-developed and well-nourished.  HENT:  Head: Normocephalic and atraumatic.  Right Ear: External ear normal.  Left Ear: External ear normal.  Nose: Nose normal.  Mouth/Throat: Oropharynx is clear and moist.  Eyes: Conjunctivae and EOM are normal. Pupils are equal, round, and reactive to light.  Neck: Normal range of motion. Neck supple. No JVD present. No tracheal deviation present. No thyromegaly present.  Cardiovascular: Normal rate, regular rhythm, normal heart sounds and intact distal pulses.  Exam reveals no gallop and no friction rub.   No murmur heard. Pulmonary/Chest: Effort normal and breath sounds normal. No stridor. No respiratory distress. He has no wheezes. He has no rales. He  exhibits no tenderness.       Bilateral symmetrical late expiratory wheezing mild to moderate  Abdominal: Soft. Bowel sounds are normal. He exhibits no distension and no mass. There is no tenderness. There is no rebound and no guarding.  Genitourinary: Rectum normal, prostate normal and penis normal. Guaiac negative stool. No penile tenderness.  Musculoskeletal: Normal range of motion. He exhibits no edema and no tenderness.  Lymphadenopathy:    He has no cervical adenopathy.  Neurological: He is alert and oriented to person, place, and time. He has normal reflexes. No cranial nerve deficit. He exhibits normal muscle tone.  Skin: Skin is warm and dry. No rash noted. No erythema. No pallor.  Psychiatric: He has a normal mood and affect. His behavior is normal. Judgment and thought content normal.          Assessment & Plan:  Healthy male  Asthma continue inhalers and prednisone followup in 3 weeks  Chronic eczema prednisone 10 mg daily  Hearing loss recommended audiogram

## 2011-09-03 NOTE — Telephone Encounter (Signed)
Patient called stating that the MD ws going to call in a fluid pill for him and it was not at the pharmacy. Patient states this should be called into [facility] in [place]. Please advise.

## 2011-09-03 NOTE — Patient Instructions (Signed)
Take the prednisone as directed  When you have finished the 20 mg tablets then switch back to the 10 mg tablets one daily  I would recommend [facility] for evaluation of a hearing aid  Return in one year sooner if any problems

## 2011-09-03 NOTE — Telephone Encounter (Signed)
Fleet Contras I called in the hydrochlorothiazide......Marland Kitchen 1 daily when necessary for fluid retention

## 2012-02-29 ENCOUNTER — Ambulatory Visit (INDEPENDENT_AMBULATORY_CARE_PROVIDER_SITE_OTHER): Payer: 59 | Admitting: Family Medicine

## 2012-02-29 ENCOUNTER — Encounter: Payer: Self-pay | Admitting: Family Medicine

## 2012-02-29 VITALS — BP 120/80 | Temp 98.1°F | Wt 250.0 lb

## 2012-02-29 DIAGNOSIS — M545 Low back pain, unspecified: Secondary | ICD-10-CM

## 2012-02-29 DIAGNOSIS — R319 Hematuria, unspecified: Secondary | ICD-10-CM

## 2012-02-29 DIAGNOSIS — N200 Calculus of kidney: Secondary | ICD-10-CM | POA: Insufficient documentation

## 2012-02-29 LAB — POCT URINALYSIS DIPSTICK
Nitrite, UA: NEGATIVE
Protein, UA: NEGATIVE
Urobilinogen, UA: 0.2
pH, UA: 5

## 2012-02-29 MED ORDER — HYDROCODONE-ACETAMINOPHEN 7.5-750 MG PO TABS: 1.0000 | ORAL_TABLET | Freq: Three times a day (TID) | ORAL | Status: DC | PRN

## 2012-02-29 NOTE — Patient Instructions (Signed)
Drink lots of water  Strain all urines  Vicodin, one half to one tablet every 4-6 hours for pain  Return on Thursday for followup sooner if any problems ,,,,,,,,,

## 2012-02-29 NOTE — Progress Notes (Signed)
  Subjective:    Patient ID: [name], male    DOB: Jan 19, 1951, 61 y.o.   MRN: [id number]  HPI [name] is a 61 year old married male nonsmoker who comes in today for followup of a kidney stone  He states he felt well until 11 PM last night when he will with the severe onset of right flank pain. He's been up and down all night. Urinalysis shows blood. Last kidney stone was 2 years ago passed spontaneously without surgical intervention   Review of Systems    general and urinary tract review of systems otherwise negative Objective:   Physical Exam Well-developed well-nourished male in no acute distress however he is pacing the room unable to sit because of pain he says now is about a 6   Urinalysis shows 1+ blood    Assessment & Plan:  Right-sided kidney stone plan drink lots of water strain urine Vicodin for pain

## 2012-03-01 ENCOUNTER — Ambulatory Visit: Payer: 59 | Admitting: Family Medicine

## 2012-03-03 ENCOUNTER — Ambulatory Visit: Payer: 59 | Admitting: Family Medicine

## 2012-04-15 ENCOUNTER — Ambulatory Visit (INDEPENDENT_AMBULATORY_CARE_PROVIDER_SITE_OTHER): Payer: 59 | Admitting: Family Medicine

## 2012-04-15 ENCOUNTER — Encounter: Payer: Self-pay | Admitting: Family Medicine

## 2012-04-15 ENCOUNTER — Encounter (INDEPENDENT_AMBULATORY_CARE_PROVIDER_SITE_OTHER): Payer: Self-pay | Admitting: General Surgery

## 2012-04-15 ENCOUNTER — Ambulatory Visit (INDEPENDENT_AMBULATORY_CARE_PROVIDER_SITE_OTHER): Payer: 59 | Admitting: General Surgery

## 2012-04-15 VITALS — BP 120/84 | Temp 97.6°F | Wt 249.0 lb

## 2012-04-15 VITALS — BP 118/72 | HR 76 | Resp 16 | Ht 71.0 in | Wt 247.0 lb

## 2012-04-15 DIAGNOSIS — IMO0002 Reserved for concepts with insufficient information to code with codable children: Secondary | ICD-10-CM

## 2012-04-15 DIAGNOSIS — R2230 Localized swelling, mass and lump, unspecified upper limb: Secondary | ICD-10-CM

## 2012-04-15 DIAGNOSIS — R229 Localized swelling, mass and lump, unspecified: Secondary | ICD-10-CM

## 2012-04-15 DIAGNOSIS — M259 Joint disorder, unspecified: Secondary | ICD-10-CM

## 2012-04-15 NOTE — Progress Notes (Addendum)
Chief Complaint  Patient presents with  . left shoulder knot and pain    HPI:  Lump on L shoulder: -first noticed several days ago and feels like has enlarged rapidly -not really that painful - a little discomfort -no fevers, chills, other lumps ROS: See pertinent positives and negatives per HPI.  Past Medical History  Diagnosis Date  . Hyperlipidemia   . Allergy   . Anxiety   . Chronic kidney disease   . Asthma   . Eczema     Family History  Problem Relation Age of Onset  . Arthritis Other   . Hypertension Other   . Osteoporosis Other   . Heart disease Other   . Lung disease Other     History   Social History  . Marital Status: Married    Spouse Name: N/A    Number of Children: N/A  . Years of Education: N/A   Social History Main Topics  . Smoking status: Never Smoker   . Smokeless tobacco: None  . Alcohol Use: Yes  . Drug Use: No  . Sexually Active:    Other Topics Concern  . None   Social History Narrative  . None    Current outpatient prescriptions:albuterol (PROAIR HFA) 108 (90 BASE) MCG/ACT inhaler, Inhale 2 puffs into the lungs every 6 (six) hours as needed., Disp: 18 g, Rfl: 1;  aspirin 81 MG tablet, Take 81 mg by mouth daily.  , Disp: , Rfl: ;  beclomethasone (QVAR) 40 MCG/ACT inhaler, Inhale 2 puffs into the lungs 2 (two) times daily., Disp: 3 Inhaler, Rfl: 5 fluticasone (FLONASE) 50 MCG/ACT nasal spray, Place 2 sprays into the nose daily., Disp: 16 g, Rfl: 11;  hydrochlorothiazide (HYDRODIURIL) 25 MG tablet, Take 1 tablet (25 mg total) by mouth daily., Disp: 90 tablet, Rfl: 3;  HYDROcodone-acetaminophen (VICODIN ES) 7.5-750 MG per tablet, Take 1 tablet by mouth every 8 (eight) hours as needed for pain., Disp: 20 tablet, Rfl: 1 Multiple Vitamin (MULTIVITAMIN) tablet, Take 1 tablet by mouth daily.  , Disp: , Rfl: ;  predniSONE (DELTASONE) 10 MG tablet, Take 1 tablet (10 mg total) by mouth daily., Disp: 100 tablet, Rfl: 3  EXAM:  Filed Vitals:   04/15/12 0924  BP: 120/84  Temp: 97.6 F (36.4 C)    There is no height on file to calculate BMI.  GENERAL: vitals reviewed and listed above, alert, oriented, appears well hydrated and in no acute distress  HEENT: atraumatic, conjunttiva clear, no obvious abnormalities on inspection of external nose and ears  NECK: no obvious masses on inspection  MS: moves all extremities without noticeable abnormality - fixed, firm aprox 5x5 mass over upper L shoulder blade  PSYCH: pleasant and cooperative, no obvious depression or anxiety  ASSESSMENT AND PLAN:  Discussed the following assessment and plan:  1. Mass  Ambulatory referral to General Surgery   -advised may be cyst or lipoma, but given fixed, firm nature and possible rapid growth would advise further eval by surgeon with likely removal and tissue evaluation to exclude neoplasm- urgent referral placed -Patient advised to return or notify a doctor immediately if symptoms worsen or persist or new concerns arise.  There are no Patient Instructions on file for this visit.   Kriste Basque R.

## 2012-04-15 NOTE — Patient Instructions (Signed)
We will get a CT of your shoulder to further evaluate the area.

## 2012-04-15 NOTE — Patient Instructions (Addendum)
-  please see the surgeon as scheduled

## 2012-04-15 NOTE — Progress Notes (Signed)
Patient ID: [name], male   DOB: Nov 23, 1950, 61 y.o.   MRN: [id number]  Chief Complaint  Patient presents with  . Mass    left shoulder    HPI [name] is a 61 y.o. male.   HPI 60 year old Caucasian male referred by Dr. Selena Ellis for a left shoulder mass. The patient states that he first noticed the area about 2 days ago. He states that it has probably increased in size a little bit since he first noticed it. He describes it as a knot. He states that it is firm and hard. He denies any injury or trauma to the area recently. He denies any redness or drainage from the area. He denies any weight loss. He denies any fever, chills or sweats. He denies any family history soft tissue cancer. He thinks that an uncle may have had liver or kidney cancer. He states that it does cause some discomfort mainly when he moves his arm or shoulder in certain ways. He denies any lymphadenopathy. He denies any other soft tissue masses. He denies any numbness or tingling down his arm. Past Medical History  Diagnosis Date  . Hyperlipidemia   . Allergy   . Anxiety   . Chronic kidney disease   . Asthma   . Eczema     No past surgical history on file.  Family History  Problem Relation Age of Onset  . Arthritis Other   . Hypertension Other   . Osteoporosis Other   . Heart disease Other   . Lung disease Other     Social History History  Substance Use Topics  . Smoking status: Never Smoker   . Smokeless tobacco: Not on file  . Alcohol Use: Yes    No Known Allergies  Current Outpatient Prescriptions  Medication Sig Dispense Refill  . aspirin 81 MG tablet Take 81 mg by mouth daily.        . beclomethasone (QVAR) 40 MCG/ACT inhaler Inhale 2 puffs into the lungs 2 (two) times daily.  3 Inhaler  5  . hydrochlorothiazide (HYDRODIURIL) 25 MG tablet Take 1 tablet (25 mg total) by mouth daily.  90 tablet  3  . Multiple Vitamin (MULTIVITAMIN) tablet Take 1 tablet by mouth daily.        . predniSONE  (DELTASONE) 10 MG tablet Take 1 tablet (10 mg total) by mouth daily.  100 tablet  3  . albuterol (PROAIR HFA) 108 (90 BASE) MCG/ACT inhaler Inhale 2 puffs into the lungs every 6 (six) hours as needed.  18 g  1  . fluticasone (FLONASE) 50 MCG/ACT nasal spray Place 2 sprays into the nose daily.  16 g  11  . HYDROcodone-acetaminophen (VICODIN ES) 7.5-750 MG per tablet Take 1 tablet by mouth every 8 (eight) hours as needed for pain.  20 tablet  1    Review of Systems Review of Systems  Constitutional: Negative for fever, chills, activity change, appetite change, fatigue and unexpected weight change.  HENT: Negative for congestion and trouble swallowing.   Eyes: Negative for visual disturbance.  Respiratory: Negative for chest tightness and shortness of breath.   Cardiovascular: Negative for chest pain and leg swelling.       No PND, no orthopnea, no DOE  Gastrointestinal: Negative for nausea, abdominal pain, diarrhea and constipation.       See HPI  Genitourinary: Negative for dysuria and hematuria.  Musculoskeletal: Negative.  Negative for back pain and joint swelling.  Skin: Negative for rash.  Neurological: Negative for seizures and speech difficulty.  Hematological: Negative for adenopathy. Does not bruise/bleed easily.  Psychiatric/Behavioral: Negative for behavioral problems and confusion.    Blood pressure 118/72, pulse 76, resp. rate 16, height 5\' 11"  (1.803 m), weight 247 lb (112.038 kg).  Physical Exam Physical Exam  Vitals reviewed. Constitutional: He is oriented to person, place, and time. He appears well-developed and well-nourished. No distress.  HENT:  Head: Normocephalic and atraumatic.  Right Ear: External ear normal.  Left Ear: External ear normal.  Eyes: Conjunctivae normal are normal. No scleral icterus.  Neck: Normal range of motion. Neck supple. No tracheal deviation present. No thyromegaly present.       Well healed incision  Cardiovascular: Normal rate,  regular rhythm and normal heart sounds.   Pulmonary/Chest: Effort normal and breath sounds normal. No respiratory distress. He has no wheezes.         5 x 5 cm left posterior shoulder soft tissue mass. Well circumscribed. No overlying skin lesion/pore. Firm. No cellulitis. No fluctuance. Doesn't move with shoulder manipulation. Appears somewhat fixed  Abdominal: Soft. He exhibits no distension. There is no tenderness.       obese  Musculoskeletal: Normal range of motion. He exhibits no edema and no tenderness.  Lymphadenopathy:       Head (right side): No preauricular, no posterior auricular and no occipital adenopathy present.       Head (left side): No preauricular, no posterior auricular and no occipital adenopathy present.    He has no cervical adenopathy.    He has no axillary adenopathy.       Right: No supraclavicular adenopathy present.       Left: No supraclavicular adenopathy present.  Neurological: He is alert and oriented to person, place, and time.  Skin: Skin is warm and dry. No rash noted. He is not diaphoretic. No erythema.  Psychiatric: He has a normal mood and affect. His behavior is normal. Judgment and thought content normal.    Data Reviewed Dr Elmyra Ricks referral note  Assessment    Left posterior shoulder soft tissue mass    Plan    I am not sure as to the exact etiology of this soft tissue mass. Physically it looks most consistent with a lipoma. But on palpation it is not as soft as a lipoma generally is. My suspicion for malignancy is very low. I believe this is a benign process. Nonetheless I am a little bit hesitant to take him straight to the operating room for excision. I have recommended to start with imaging. I believe a CT scan will give [place] the best picture to start with. He'll be scheduled for CT scan of his left shoulder. I will base the followup on the results of his CAT scan.  Adam Sella. Andrey Campanile, MD, FACS General, Bariatric, & Minimally Invasive  Surgery [facility] Surgery, [place]        [facility] M 04/15/2012, 5:52 PM

## 2012-04-22 ENCOUNTER — Other Ambulatory Visit: Payer: 59

## 2012-04-28 ENCOUNTER — Ambulatory Visit (INDEPENDENT_AMBULATORY_CARE_PROVIDER_SITE_OTHER): Payer: Self-pay | Admitting: General Surgery

## 2012-05-03 ENCOUNTER — Telehealth (INDEPENDENT_AMBULATORY_CARE_PROVIDER_SITE_OTHER): Payer: Self-pay | Admitting: General Surgery

## 2012-05-03 NOTE — Telephone Encounter (Signed)
Message copied by Liliana Cline on Tue May 03, 2012  1:29 PM ------      Message from: Rise Paganini      Created: Tue May 03, 2012  1:24 PM      Regarding: Andrey Campanile      Contact: [phone]       Patient stated that he was to have an MRI and then be scheduled for surgery. He stated that he has been waiting to here from someone and he's not sure who that someone is. I did put him through to the surgery scheduling line just in case the orders have already been written. Thank you.

## 2012-05-03 NOTE — Telephone Encounter (Signed)
Called patient and made him aware insurance denied CT scan. We received a peer-to-peer request and I made him aware we would call tomorrow to try to get this approved and call after.

## 2012-05-05 ENCOUNTER — Telehealth (INDEPENDENT_AMBULATORY_CARE_PROVIDER_SITE_OTHER): Payer: Self-pay | Admitting: General Surgery

## 2012-05-05 NOTE — Telephone Encounter (Signed)
Spoke with patient and rescheduled CT scan for Wednesday 05/11/12 @ 1:15 pm. We will be in touch with results after the scan is performed.

## 2012-05-05 NOTE — Telephone Encounter (Signed)
Left message on machine for patient to call back and ask for me. To make patient aware we got insurance approval for CT scan after peer-to-peer discussion. TO see if he would like me to set up date/time for CT scan or give him the number [phone] and let him set up for a good date/time for him. Awaiting patient call back.

## 2012-05-11 ENCOUNTER — Ambulatory Visit
Admission: RE | Admit: 2012-05-11 | Discharge: 2012-05-11 | Disposition: A | Discharge status: Home / Self Care | Payer: 59 | Source: Ambulatory Visit | Attending: General Surgery | Admitting: General Surgery

## 2012-05-11 DIAGNOSIS — R2230 Localized swelling, mass and lump, unspecified upper limb: Secondary | ICD-10-CM

## 2012-05-11 MED ORDER — IOHEXOL 300 MG/ML  SOLN
100.0000 mL | Freq: Once | INTRAMUSCULAR | Status: AC | PRN
  Administered 2012-05-11: 100 mL via INTRAVENOUS

## 2012-05-12 ENCOUNTER — Telehealth (INDEPENDENT_AMBULATORY_CARE_PROVIDER_SITE_OTHER): Payer: Self-pay | Admitting: General Surgery

## 2012-05-12 ENCOUNTER — Other Ambulatory Visit (INDEPENDENT_AMBULATORY_CARE_PROVIDER_SITE_OTHER): Payer: Self-pay | Admitting: General Surgery

## 2012-05-12 NOTE — Telephone Encounter (Signed)
Discussed CT results with pt. Suspicious for cancer but will need tissue biopsy. Explained that area could still be a benign process but it is suspicious. Discussed pros and cons of core bx vs incisional bx. Explained i wouldn't recommend a excisional bx if b/c this is a sarcoma may interfere with further management. Discussed risk/benefits of incisional bx. Pt agrees to proceed. Will schedule for incisional bx ASAP.  Mary Sella. Andrey Campanile, MD, FACS General, Bariatric, & Minimally Invasive Surgery [facility], [place]

## 2012-05-13 ENCOUNTER — Encounter (HOSPITAL_BASED_OUTPATIENT_CLINIC_OR_DEPARTMENT_OTHER): Payer: Self-pay | Admitting: *Deleted

## 2012-05-13 ENCOUNTER — Encounter (HOSPITAL_BASED_OUTPATIENT_CLINIC_OR_DEPARTMENT_OTHER)
Admission: RE | Admit: 2012-05-13 | Discharge: 2012-05-13 | Disposition: A | Discharge status: Home / Self Care | Payer: 59 | Source: Ambulatory Visit | Attending: General Surgery | Admitting: General Surgery

## 2012-05-13 LAB — COMPREHENSIVE METABOLIC PANEL
ALT: 42 U/L (ref 0–53)
AST: 28 U/L (ref 0–37)
Albumin: 4.2 g/dL (ref 3.5–5.2)
Alkaline Phosphatase: 48 U/L (ref 39–117)
BUN: 19 mg/dL (ref 6–23)
CO2: 24 mEq/L (ref 19–32)
Calcium: 9.1 mg/dL (ref 8.4–10.5)
Chloride: 99 mEq/L (ref 96–112)
Creatinine, Ser: 0.9 mg/dL (ref 0.50–1.35)
GFR calc Af Amer: >90 mL/min (ref >90)
GFR calc non Af Amer: 89 mL/min — ABNORMAL LOW (ref >90)
Glucose, Bld: 106 mg/dL — ABNORMAL HIGH (ref 70–99)
Potassium: 4.2 mEq/L (ref 3.5–5.1)
Sodium: 136 mEq/L (ref 135–145)
Total Bilirubin: 0.6 mg/dL (ref 0.3–1.2)
Total Protein: 7 g/dL (ref 6.0–8.3)

## 2012-05-13 LAB — CBC WITH DIFFERENTIAL/PLATELET
HCT: 45.2 % (ref 39.0–52.0)
Hemoglobin: 16 g/dL (ref 13.0–17.0)
Lymphocytes Relative: 18 % (ref 12–46)
Lymphs Abs: 1.4 10*3/uL (ref 0.7–4.0)
Monocytes Absolute: 0.9 10*3/uL (ref 0.1–1.0)
Monocytes Relative: 11 % (ref 3–12)
Neutro Abs: 5.3 10*3/uL (ref 1.7–7.7)
WBC: 8.1 10*3/uL (ref 4.0–10.5)

## 2012-05-13 NOTE — Progress Notes (Signed)
Pt here for labs No heart problems Controlled asthma Denies any OSA

## 2012-05-17 ENCOUNTER — Encounter (HOSPITAL_BASED_OUTPATIENT_CLINIC_OR_DEPARTMENT_OTHER): Payer: Self-pay | Admitting: *Deleted

## 2012-05-17 ENCOUNTER — Encounter (HOSPITAL_BASED_OUTPATIENT_CLINIC_OR_DEPARTMENT_OTHER)
Admission: RE | Disposition: A | Discharge status: Home / Self Care | Payer: Self-pay | Source: Ambulatory Visit | Attending: General Surgery

## 2012-05-17 ENCOUNTER — Encounter (HOSPITAL_BASED_OUTPATIENT_CLINIC_OR_DEPARTMENT_OTHER): Payer: Self-pay | Admitting: Anesthesiology

## 2012-05-17 ENCOUNTER — Ambulatory Visit (HOSPITAL_BASED_OUTPATIENT_CLINIC_OR_DEPARTMENT_OTHER): Payer: 59 | Admitting: Anesthesiology

## 2012-05-17 ENCOUNTER — Ambulatory Visit (HOSPITAL_BASED_OUTPATIENT_CLINIC_OR_DEPARTMENT_OTHER)
Admission: RE | Admit: 2012-05-17 | Discharge: 2012-05-17 | Disposition: A | Discharge status: Home / Self Care | Payer: 59 | Source: Ambulatory Visit | Attending: General Surgery | Admitting: General Surgery

## 2012-05-17 DIAGNOSIS — D2110 Benign neoplasm of connective and other soft tissue of unspecified upper limb, including shoulder: Secondary | ICD-10-CM

## 2012-05-17 DIAGNOSIS — N189 Chronic kidney disease, unspecified: Secondary | ICD-10-CM | POA: Insufficient documentation

## 2012-05-17 DIAGNOSIS — E785 Hyperlipidemia, unspecified: Secondary | ICD-10-CM | POA: Insufficient documentation

## 2012-05-17 DIAGNOSIS — M729 Fibroblastic disorder, unspecified: Secondary | ICD-10-CM | POA: Insufficient documentation

## 2012-05-17 HISTORY — DX: Unspecified hearing loss, unspecified ear: H91.90

## 2012-05-17 HISTORY — DX: Presence of dental prosthetic device (complete) (partial): Z97.2

## 2012-05-17 HISTORY — DX: Localized edema: R60.0

## 2012-05-17 HISTORY — PX: LIPOMA EXCISION: SHX5283

## 2012-05-17 HISTORY — DX: Unspecified osteoarthritis, unspecified site: M19.90

## 2012-05-17 LAB — POCT HEMOGLOBIN-HEMACUE: Hemoglobin: 17 g/dL (ref 13.0–17.0)

## 2012-05-17 SURGERY — EXCISION LIPOMA
Anesthesia: General | Site: Shoulder | Laterality: Left | Wound class: Clean

## 2012-05-17 MED ORDER — CEFAZOLIN SODIUM-DEXTROSE 2-3 GM-% IV SOLR
2.0000 g | INTRAVENOUS | Status: AC
  Administered 2012-05-17: 2 g via INTRAVENOUS

## 2012-05-17 MED ORDER — BUPIVACAINE-EPINEPHRINE 0.25% -1:200000 IJ SOLN
INTRAMUSCULAR | Status: DC | PRN
  Administered 2012-05-17: 9 mL

## 2012-05-17 MED ORDER — OXYCODONE-ACETAMINOPHEN 5-325 MG PO TABS: 1.0000 | ORAL_TABLET | ORAL | Status: DC | PRN

## 2012-05-17 MED ORDER — MIDAZOLAM HCL 5 MG/5ML IJ SOLN
INTRAMUSCULAR | Status: DC | PRN
  Administered 2012-05-17: 2 mg via INTRAVENOUS

## 2012-05-17 MED ORDER — ONDANSETRON HCL 4 MG/2ML IJ SOLN: 4.0000 mg | Freq: Once | INTRAMUSCULAR | Status: DC | PRN

## 2012-05-17 MED ORDER — OXYCODONE HCL 5 MG/5ML PO SOLN: 5.0000 mg | Freq: Once | ORAL | Status: AC | PRN

## 2012-05-17 MED ORDER — HYDROMORPHONE HCL PF 1 MG/ML IJ SOLN
0.2500 mg | INTRAMUSCULAR | Status: DC | PRN
Start: 2012-05-17 — End: 2012-05-17
  Administered 2012-05-17 (×2): 0.5 mg via INTRAVENOUS

## 2012-05-17 MED ORDER — OXYCODONE HCL 5 MG PO TABS
5.0000 mg | ORAL_TABLET | Freq: Once | ORAL | Status: AC | PRN
  Administered 2012-05-17: 5 mg via ORAL

## 2012-05-17 MED ORDER — ONDANSETRON HCL 4 MG/2ML IJ SOLN
INTRAMUSCULAR | Status: DC | PRN
  Administered 2012-05-17: 4 mg via INTRAVENOUS

## 2012-05-17 MED ORDER — CHLORHEXIDINE GLUCONATE 4 % EX LIQD: 1.0000 "application " | Freq: Once | CUTANEOUS | Status: DC

## 2012-05-17 MED ORDER — FENTANYL CITRATE 0.05 MG/ML IJ SOLN
INTRAMUSCULAR | Status: DC | PRN
  Administered 2012-05-17: 100 ug via INTRAVENOUS

## 2012-05-17 MED ORDER — LACTATED RINGERS IV SOLN
INTRAVENOUS | Status: DC
  Administered 2012-05-17 (×2): via INTRAVENOUS

## 2012-05-17 MED ORDER — LIDOCAINE HCL (CARDIAC) 20 MG/ML IV SOLN
INTRAVENOUS | Status: DC | PRN
  Administered 2012-05-17: 80 mg via INTRAVENOUS

## 2012-05-17 MED ORDER — PROPOFOL 10 MG/ML IV BOLUS
INTRAVENOUS | Status: DC | PRN
  Administered 2012-05-17: 250 mg via INTRAVENOUS

## 2012-05-17 SURGICAL SUPPLY — 54 items
BENZOIN TINCTURE PRP APPL 2/3 (GAUZE/BANDAGES/DRESSINGS) IMPLANT
BLADE HEX COATED 2.75 (ELECTRODE) IMPLANT
BLADE SURG 15 STRL LF DISP TIS (BLADE) ×1 IMPLANT
BLADE SURG 15 STRL SS (BLADE) ×1
BLADE SURG ROTATE 9660 (MISCELLANEOUS) IMPLANT
CANISTER SUCTION 1200CC (MISCELLANEOUS) IMPLANT
CHLORAPREP W/TINT 26ML (MISCELLANEOUS) ×2 IMPLANT
COVER MAYO STAND STRL (DRAPES) ×2 IMPLANT
COVER TABLE BACK 60X90 (DRAPES) ×2 IMPLANT
DECANTER SPIKE VIAL GLASS SM (MISCELLANEOUS) IMPLANT
DERMABOND ADVANCED (GAUZE/BANDAGES/DRESSINGS) ×1
DERMABOND ADVANCED .7 DNX12 (GAUZE/BANDAGES/DRESSINGS) ×1 IMPLANT
DRAPE PED LAPAROTOMY (DRAPES) ×2 IMPLANT
DRAPE UTILITY XL STRL (DRAPES) ×2 IMPLANT
DRSG TEGADERM 4X4.75 (GAUZE/BANDAGES/DRESSINGS) IMPLANT
ELECT COATED BLADE 2.86 ST (ELECTRODE) IMPLANT
ELECT REM PT RETURN 9FT ADLT (ELECTROSURGICAL) ×2
ELECTRODE REM PT RTRN 9FT ADLT (ELECTROSURGICAL) ×1 IMPLANT
GAUZE PACKING IODOFORM 1/4X5 (PACKING) IMPLANT
GAUZE SPONGE 4X4 12PLY STRL LF (GAUZE/BANDAGES/DRESSINGS) IMPLANT
GLOVE BIO SURGEON STRL SZ 6.5 (GLOVE) ×2 IMPLANT
GLOVE BIO SURGEON STRL SZ7.5 (GLOVE) ×2 IMPLANT
GLOVE BIOGEL PI IND STRL 7.0 (GLOVE) ×1 IMPLANT
GLOVE BIOGEL PI IND STRL 8 (GLOVE) ×1 IMPLANT
GLOVE BIOGEL PI INDICATOR 7.0 (GLOVE) ×1
GLOVE BIOGEL PI INDICATOR 8 (GLOVE) ×1
GOWN PREVENTION PLUS XLARGE (GOWN DISPOSABLE) ×2 IMPLANT
GOWN PREVENTION PLUS XXLARGE (GOWN DISPOSABLE) ×2 IMPLANT
NEEDLE HYPO 25X1 1.5 SAFETY (NEEDLE) ×2 IMPLANT
NS IRRIG 1000ML POUR BTL (IV SOLUTION) IMPLANT
PACK BASIN DAY SURGERY FS (CUSTOM PROCEDURE TRAY) ×2 IMPLANT
PENCIL BUTTON HOLSTER BLD 10FT (ELECTRODE) ×2 IMPLANT
SPONGE GAUZE 4X4 12PLY (GAUZE/BANDAGES/DRESSINGS) IMPLANT
STRIP CLOSURE SKIN 1/2X4 (GAUZE/BANDAGES/DRESSINGS) IMPLANT
SUT ETHILON 2 0 FS 18 (SUTURE) IMPLANT
SUT ETHILON 4 0 PS 2 18 (SUTURE) IMPLANT
SUT MNCRL AB 4-0 PS2 18 (SUTURE) ×2 IMPLANT
SUT SILK 2 0 SH (SUTURE) IMPLANT
SUT VIC AB 2-0 SH 27 (SUTURE)
SUT VIC AB 2-0 SH 27XBRD (SUTURE) IMPLANT
SUT VIC AB 3-0 SH 27 (SUTURE) ×1
SUT VIC AB 3-0 SH 27X BRD (SUTURE) ×1 IMPLANT
SUT VIC AB 4-0 SH 18 (SUTURE) IMPLANT
SUT VICRYL 3-0 CR8 SH (SUTURE) IMPLANT
SUT VICRYL 4-0 PS2 18IN ABS (SUTURE) IMPLANT
SWAB COLLECTION DEVICE MRSA (MISCELLANEOUS) IMPLANT
SYR CONTROL 10ML LL (SYRINGE) ×2 IMPLANT
TOWEL OR 17X24 6PK STRL BLUE (TOWEL DISPOSABLE) ×2 IMPLANT
TOWEL OR NON WOVEN STRL DISP B (DISPOSABLE) ×2 IMPLANT
TUBE ANAEROBIC SPECIMEN COL (MISCELLANEOUS) IMPLANT
TUBE CONNECTING 20X1/4 (TUBING) IMPLANT
UNDERPAD 30X30 INCONTINENT (UNDERPADS AND DIAPERS) IMPLANT
WATER STERILE IRR 1000ML POUR (IV SOLUTION) ×2 IMPLANT
YANKAUER SUCT BULB TIP NO VENT (SUCTIONS) IMPLANT

## 2012-05-17 NOTE — Anesthesia Procedure Notes (Signed)
Procedure Name: LMA Insertion Date/Time: 05/17/2012 12:31 PM Performed by: Zenia Resides D Pre-anesthesia Checklist: Patient identified, Emergency Drugs available, Suction available and Patient being monitored Patient Re-evaluated:Patient Re-evaluated prior to inductionOxygen Delivery Method: Circle System Utilized Preoxygenation: Pre-oxygenation with 100% oxygen Intubation Type: IV induction Ventilation: Mask ventilation without difficulty LMA: LMA inserted LMA Size: 5.0 Number of attempts: 1 Airway Equipment and Method: bite block Placement Confirmation: positive ETCO2 Tube secured with: Tape Dental Injury: Teeth and Oropharynx as per pre-operative assessment

## 2012-05-17 NOTE — Anesthesia Preprocedure Evaluation (Signed)
Anesthesia Evaluation  Patient identified by MRN, date of birth, ID band Patient awake    Reviewed: Allergy & Precautions, H&P , NPO status , Patient's Chart, lab work & pertinent test results  Airway Mallampati: II TM Distance: >3 FB Neck ROM: Full    Dental  (+) Teeth Intact, Partial Upper and Dental Advisory Given   Pulmonary asthma ,  breath sounds clear to auscultation        Cardiovascular Rhythm:Regular Rate:Normal     Neuro/Psych    GI/Hepatic   Endo/Other  Morbid obesity  Renal/GU Renal disease     Musculoskeletal   Abdominal   Peds  Hematology   Anesthesia Other Findings   Reproductive/Obstetrics                           Anesthesia Physical Anesthesia Plan  ASA: II  Anesthesia Plan: General   Post-op Pain Management:    Induction: Intravenous  Airway Management Planned: LMA  Additional Equipment:   Intra-op Plan:   Post-operative Plan: Extubation in OR  Informed Consent: I have reviewed the patients History and Physical, chart, labs and discussed the procedure including the risks, benefits and alternatives for the proposed anesthesia with the patient or authorized representative who has indicated his/her understanding and acceptance.   Dental advisory given  Plan Discussed with: CRNA, Anesthesiologist and Surgeon  Anesthesia Plan Comments:         Anesthesia Quick Evaluation

## 2012-05-17 NOTE — Anesthesia Postprocedure Evaluation (Signed)
  Anesthesia Post-op Note  Patient: [name]  Procedure(s) Performed: Procedure(s) (LRB) with comments: EXCISION LIPOMA (Left) - incisional biopsy of left shoulder soft tissue mass  Patient Location: PACU  Anesthesia Type:General  Level of Consciousness: awake, alert  and oriented  Airway and Oxygen Therapy: Patient Spontanous Breathing  Post-op Pain: mild  Post-op Assessment: Post-op Vital signs reviewed  Post-op Vital Signs: Reviewed  Complications: No apparent anesthesia complications

## 2012-05-17 NOTE — H&P (Signed)
[name] is an 62 y.o. male.   Chief Complaint: here for bx HPI: 62 year old Caucasian male referred by Dr. Selena Batten for a left shoulder mass. The patient states that he first noticed some soreness in the area about 2 days ago. He states that it has probably increased in size a little bit since he first noticed it about a month ago in November. He describes it as a knot. He states that it is firm and hard. He denies any injury or trauma to the area recently. He denies any redness or drainage from the area. He denies any weight loss. He denies any fever, chills or sweats. He denies any family history soft tissue cancer. He thinks that an uncle may have had liver or kidney cancer. He states that it does cause some discomfort mainly when he moves his arm or shoulder in certain ways. He denies any lymphadenopathy. He denies any other soft tissue masses. He denies any numbness or tingling down his arm.  See my telephone note regarding discussion of CT results  Past Medical History  Diagnosis Date  . Hyperlipidemia   . Allergy   . Anxiety   . Chronic kidney disease   . Asthma   . Eczema   . Arthritis   . Edema of foot     feet  . Wears dentures     bottom  . Wears glasses     driving  . HOH (hard of hearing)     Past Surgical History  Procedure Date  . Cervical fusion 2008    c6-7  . Nose surgery     fx as child  . Orif ulnar fracture     rt as child  . Colonoscopy   . Finger fracture surgery     as child    Family History  Problem Relation Age of Onset  . Arthritis Other   . Hypertension Other   . Osteoporosis Other   . Heart disease Other   . Lung disease Other    Social History:  reports that he has never smoked. He does not have any smokeless tobacco history on file. He reports that he drinks alcohol. He reports that he does not use illicit drugs.  Allergies:  Allergies  Allergen Reactions  . Sesame Oil Itching  . Tobramycin     Used in an eye oint-reaction     Medications Prior to Admission  Medication Sig Dispense Refill  . albuterol (PROAIR HFA) 108 (90 BASE) MCG/ACT inhaler Inhale 2 puffs into the lungs every 6 (six) hours as needed.  18 g  1  . aspirin 81 MG tablet Take 81 mg by mouth daily.        . beclomethasone (QVAR) 40 MCG/ACT inhaler Inhale 2 puffs into the lungs 2 (two) times daily.  3 Inhaler  5  . fluticasone (FLONASE) 50 MCG/ACT nasal spray Place 2 sprays into the nose daily.  16 g  11  . hydrochlorothiazide (HYDRODIURIL) 25 MG tablet Take 1 tablet (25 mg total) by mouth daily.  90 tablet  3  . Multiple Vitamin (MULTIVITAMIN) tablet Take 1 tablet by mouth daily.        . predniSONE (DELTASONE) 10 MG tablet Take 1 tablet (10 mg total) by mouth daily.  100 tablet  3  . HYDROcodone-acetaminophen (VICODIN ES) 7.5-750 MG per tablet Take 1 tablet by mouth every 8 (eight) hours as needed for pain.  20 tablet  1    Results for orders  placed during the hospital encounter of 05/17/12 (from the past 48 hour(s))  POCT HEMOGLOBIN-HEMACUE     Status: Normal   Collection Time   05/17/12 11:41 AM      Component Value Range Comment   Hemoglobin 17.0  13.0 - 17.0 g/dL    No results found.  Review of Systems  Constitutional: Negative for fever, chills and weight loss.  Eyes: Negative for blurred vision.  Respiratory: Negative for shortness of breath and wheezing.   Cardiovascular: Negative for chest pain, palpitations and orthopnea.  Neurological: Negative for tingling, seizures and loss of consciousness.  Endo/Heme/Allergies: Does not bruise/bleed easily.  Psychiatric/Behavioral: Negative for substance abuse.  All other systems reviewed and are negative.    Blood pressure 127/86, pulse 68, temperature 98.2 F (36.8 C), temperature source Oral, resp. rate 18, height 5\' 11"  (1.803 m), weight 251 lb 6 oz (114.023 kg), SpO2 96.00%. Physical Exam  Vitals reviewed. Constitutional: He is oriented to person, place, and time. He appears  well-developed and well-nourished. No distress.  HENT:  Head: Normocephalic and atraumatic.  Right Ear: External ear normal.  Left Ear: External ear normal.  Eyes: Conjunctivae normal are normal. No scleral icterus.  Neck: Normal range of motion. Neck supple. No tracheal deviation present. No thyromegaly present.  Cardiovascular: Normal rate.   Respiratory: Effort normal. No respiratory distress. He has no wheezes.         Soft tissue subcu mass, well circumscribed. Firm. Fixed. No overlying skin lesion  GI: He exhibits no distension.  Musculoskeletal: He exhibits no edema.  Lymphadenopathy:    He has no cervical adenopathy.    He has no axillary adenopathy.  Neurological: He is alert and oriented to person, place, and time.  Skin: Skin is warm and dry. No rash noted. He is not diaphoretic. No erythema. No pallor.  Psychiatric: He has a normal mood and affect. His behavior is normal. Judgment and thought content normal.     Assessment/Plan Left shoulder subcu soft tissue mass  For incisional bx to determine nature of mass  Discussed risk/benefits (bleeding, infection, scarring, wound healing issues, blood clot formation, anesthesia issues, need for additional procedures, post op care) with pt and family. Discussed alternative bx choice like core bx. Pt desires incisional bx.  Mary Sella. Andrey Campanile, MD, FACS General, Bariatric, & Minimally Invasive Surgery [facility], [place]   [facility] M 05/17/2012, 11:54 AM

## 2012-05-17 NOTE — Brief Op Note (Signed)
05/17/2012  1:09 PM  PATIENT:  [name]  62 y.o. male  PRE-OPERATIVE DIAGNOSIS:  left shoulder soft tissue mass  POST-OPERATIVE DIAGNOSIS:  left shoulder soft tissue mass  PROCEDURE:  INCISIONAL BIOPSY OF LEFT SHOULDER SUBCUTANEOUS SOFT TISSUE MASS  SURGEON:  Surgeon(s) and Role:    * Atilano Ina, MD,FACS - Primary  PHYSICIAN ASSISTANT: NONE  ASSISTANTS: none   ANESTHESIA:   GEN with LMA  EBL:  Total I/O In: 200 [I.V.:200] Out: -   BLOOD ADMINISTERED:none  DRAINS: none   LOCAL MEDICATIONS USED:  MARCAINE     SPECIMEN:  Source of Specimen:  left shoulder subcutaenous soft tissue mass  DISPOSITION OF SPECIMEN:  PATHOLOGY  COUNTS:  YES  TOURNIQUET:  * No tourniquets in log *  DICTATION: .Other Dictation: Dictation Number [id number]  PLAN OF CARE: Discharge to home after PACU  PATIENT DISPOSITION:  PACU - hemodynamically stable.   Delay start of Pharmacological VTE agent (>24hrs) due to surgical blood loss or risk of bleeding: not applicable

## 2012-05-17 NOTE — Transfer of Care (Signed)
Immediate Anesthesia Transfer of Care Note  Patient: [name]  Procedure(s) Performed: Procedure(s) (LRB) with comments: EXCISION LIPOMA (Left) - incisional biopsy of left shoulder soft tissue mass  Patient Location: PACU  Anesthesia Type:General  Level of Consciousness: awake, alert  and oriented  Airway & Oxygen Therapy: Patient Spontanous Breathing and Patient connected to face mask oxygen  Post-op Assessment: Report given to PACU RN and Post -op Vital signs reviewed and stable  Post vital signs: Reviewed and stable  Complications: No apparent anesthesia complications

## 2012-05-18 ENCOUNTER — Telehealth (INDEPENDENT_AMBULATORY_CARE_PROVIDER_SITE_OTHER): Payer: Self-pay | Admitting: General Surgery

## 2012-05-18 ENCOUNTER — Encounter (HOSPITAL_BASED_OUTPATIENT_CLINIC_OR_DEPARTMENT_OTHER): Payer: Self-pay | Admitting: General Surgery

## 2012-05-18 NOTE — Telephone Encounter (Signed)
Spoke with patient gave post op appt for 06/08/12 @12 :00. He states he is doing well other than a bit sore.

## 2012-05-18 NOTE — Op Note (Signed)
[name], [name]                  ACCOUNT NO.:  [id number]  MEDICAL RECORD NO.:  [id number]  LOCATION:                                 FACILITY:  PHYSICIAN:  Adam Sella. Andrey Campanile, MD, FACSDATE OF BIRTH:  October 18, 1950  DATE OF PROCEDURE:  05/17/2012 DATE OF DISCHARGE:                              OPERATIVE REPORT   PREOPERATIVE DIAGNOSIS:  Left shoulder subcutaneous soft tissue mass.  POSTOPERATIVE DIAGNOSIS:  Left shoulder subcutaneous soft tissue mass.  PROCEDURE:  Incisional biopsy of left shoulder subcutaneous soft tissue mass.  SURGEON:  Adam Sella. Andrey Campanile, MD, FACS  ASSISTANT SURGEON:  None.  ANESTHESIA:  General with LMA.  EBL:  Minimal.  SPECIMEN:  Left shoulder subcutaneous soft tissue mass.  INDICATIONS FOR PROCEDURE:  The patient is a very pleasant 62 year old Caucasian male, who was sent to me for evaluation of soft tissue subcutaneous mass on his left shoulder/left scapula area in mid December.  He states that he had noticed it about a month prior; however, it only caused soreness in mid December.  It was not consistent with a lipoma on physical exam.  Prior to doing any type of excision and biopsy, I recommend getting additional imaging.  We ended up getting a CT scan of the area, which confirmed a soft tissue subcutaneous mass roughly 4 cm x 3 cm x 4 cm.  It was not consistent with the lipoma or sebaceous cyst.  It was somewhat concerning for neoplasm.  We discussed core biopsy versus incisional biopsy.  I did not recommend excisional biopsy at that time.  Since if this was a cancer, it may need a wide local excision plus or minus catheter for radiation treatment if this did turn out to be a sarcoma.  We discussed the pros and cons of core biopsy versus excisional biopsy.  The patient elected to have an incisional biopsy.  We discussed the risks and benefits of surgery including, but not limited to, bleeding, infection, injury to surrounding structures, hematoma  formation, seroma formation, scarring, wound healing complications, blood clot formation, anesthesia issues and distinct possibility of the need for additional procedures.  The patient elected to proceed to the operating room.  DESCRIPTION OF PROCEDURE:  After obtaining informed consent, marking the area with my initials in the holding area with the patient confirming the operative site, he was taken to room #6 at the [facility].  He was placed supine on a beanbag.  General LMA anesthesia was established.  He was then placed in the lateral position with left side up with the appropriate padding between his arms and legs.  He received IV antibiotics prior to skin incision.  A surgical time-out was performed.  His left shoulder and upper back were prepped and draped in usual standard surgical fashion with ChloraPrep.  The area measured roughly 7.5 cm long by about 5 cm wide.  It was firm, it was well circumscribed, it was fixed and nonmobile.  There was no overlying skin lesions or skin changes.  I made a vertical skin mark in the middle point of the mass.  Then infiltrated with local.  I made a 2.5-cm skin  incision with #15 blade, divided the deep dermis with electrocautery. The lesion came up to the deep dermis.  Using a 15-blade scalpel, I excised 4 sections of the mass.  The mass appeared quite firm, solid, and rubbery.  Four sections were taken out through that same incision. Each were about 0.5 cm in size.  I felt I obtained enough tissue for the pathologist.  There was some bleeding from the base of the biopsy site. This was cauterized with electrocautery.  Additional local was infiltrated in a regional fashion.  The deep dermis was reapproximated with inverted interrupted 3-0 Vicryls and the skin was reapproximated with a running 4-0 Monocryl in a subcuticular fashion followed by the application of benzoin, Steri-Strips, 2 x 2, and occlusive dressing. The patient was  taken to the recovery room in stable condition.  All needle, instrument, and sponge counts were correct x2.  There were no immediate complications.     Adam Sella. Andrey Campanile, MD, FACS     EMW/MEDQ  D:  05/17/2012  T:  05/18/2012  Job:  [id number]  cc:   Adam Gens A. Tawanna Cooler, MD [name], D.O.

## 2012-05-20 ENCOUNTER — Telehealth (INDEPENDENT_AMBULATORY_CARE_PROVIDER_SITE_OTHER): Payer: Self-pay | Admitting: General Surgery

## 2012-05-20 ENCOUNTER — Telehealth (INDEPENDENT_AMBULATORY_CARE_PROVIDER_SITE_OTHER): Payer: Self-pay

## 2012-05-20 NOTE — Telephone Encounter (Signed)
Patient called in for path results. I told him results were not in and to try and call back Monday.

## 2012-05-20 NOTE — Telephone Encounter (Signed)
Called to discuss preliminary path result with patient. Explained that the growth is a very rare growth. Path was sent to [facility] for 2nd opinion. Will be at least a week before final path back.

## 2012-05-26 ENCOUNTER — Telehealth (INDEPENDENT_AMBULATORY_CARE_PROVIDER_SITE_OTHER): Payer: Self-pay | Admitting: General Surgery

## 2012-05-26 NOTE — Telephone Encounter (Signed)
Appt made with patient for 11:30 am on Monday.

## 2012-05-26 NOTE — Telephone Encounter (Signed)
Message copied by Liliana Cline on Thu May 26, 2012  4:40 PM ------      Message from: Andrey Campanile, ERIC M      Created: Thu May 26, 2012  4:30 PM       pls call pt with a f/u appt with me for this Monday to discuss path. Just spoke with him on phone.             Thanks      wilson

## 2012-05-26 NOTE — Telephone Encounter (Signed)
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 Discussed desmoids. Although not malignant, can be locally aggressive and can recur even with negative surgical margins. Will bring pt into office this coming Monday to discuss path further and ct and discuss options: observation vs surgical excision vs RT

## 2012-05-30 ENCOUNTER — Encounter (INDEPENDENT_AMBULATORY_CARE_PROVIDER_SITE_OTHER): Payer: Self-pay | Admitting: General Surgery

## 2012-05-30 ENCOUNTER — Ambulatory Visit (INDEPENDENT_AMBULATORY_CARE_PROVIDER_SITE_OTHER): Payer: 59 | Admitting: General Surgery

## 2012-05-30 VITALS — BP 160/64 | HR 100 | Temp 97.3°F | Resp 20 | Ht 71.0 in | Wt 254.2 lb

## 2012-05-30 DIAGNOSIS — D481 Neoplasm of uncertain behavior of connective and other soft tissue: Secondary | ICD-10-CM | POA: Insufficient documentation

## 2012-05-30 NOTE — Addendum Note (Signed)
Addended byLiliana Cline on: 05/30/2012 02:25 PM   Modules accepted: Orders

## 2012-05-30 NOTE — Progress Notes (Signed)
Subjective:     Patient ID: [name], male   DOB: November 04, 1950, 62 y.o.   MRN: [id number]  HPI 62 year old Caucasian male comes in today for postoperative visit as well as to discuss his pathology report. He underwent incisional biopsy of a left shoulder soft tissue mass on January 21. He states that he has done well since surgery. He complains of some itching over the incision. He also continues to complain of some weakness in his left arm which has been there for several months. He denies any problems with his incision such as redness or drainage.  Review of Systems     Objective:   Physical Exam BP 160/64  Pulse 100  Temp 97.3 F (36.3 C) (Temporal)  Resp 20  Ht 5\' 11"  (1.803 m)  Wt 254 lb 3.2 oz (115.304 kg)  BMI 35.45 kg/m2 Alert, no apparent distress Left shoulder-well-healed vertical incision. No cellulitis, no fluctuance, no hematoma. Well-circumscribed soft tissue mass present    Assessment:     Left shoulder desmoid-type fibromatosis Status post left shoulder incisional biopsy of soft tissue mass    Plan:     He appears to be to be healing well from surgery. His incision is healing well. His pathology was sent to the [facility] for expert review and they concurred with our local pathologist. He has a desmoid-type fibromatosis. The patient was given a copy of his pathology report. We also went back over his CT scan that he had preoperatively. He was given educational material regarding desmoid-type tumors. The majority of this appointment was spent in counseling. I explained that this is a very rare type of growth.  I explained that this is not a true cancer however it is a very locally aggressive soft tissue growth. I explained that these tumors do not metastasize; however, they can invade local structures and cause significant impairment. I explained that it has a high likelihood of recurrence after resection. I explained that there are different treatment options available  for this type of growth. I explained that his mass does appear to abut his scapula spine and that I am not confident that I could get an R0 resection. I explained that in my opinion or if he was one of my family members he would be best served by being evaluated at an [facility] with a multidisciplinary team already in place for these types of growths. I explained that he needs to be evaluated by a surgical oncologist and possible radiation oncologists and perhaps a plastic surgeon as well as a potential orthopedic surgeon. He and his wife are in agreement with referral to a [facility]. We discussed referrals to [facility] versus [facility] versus [place]. The patient would prefer to be seen at [facility]. I spoke with Dr. Earney Navy who has agreed to see the patient in consultation.  Mary Sella. Andrey Campanile, MD, FACS General, Bariatric, & Minimally Invasive Surgery [facility], [place]

## 2012-05-30 NOTE — Patient Instructions (Addendum)
What are Desmoid Tumors? Desmoid tumors are tumors that arise from cells called fibroblasts. Fibroblasts are found throughout our body and their main function is to provide structural support and protection to the vital organs such as lung, liver, blood vessels, heart, kidneys, skin, intestines etc. and they also play a critical role in wound healing. When fibroblast cells undergo mutations they can become cancerous and become desmoid tumors (also known as "aggressive fibromatosis"). Desmoid tumors can arise in virtually any part of the body. These tumors often occur in women in their 30's, but can occur in anyone at any age. Desmoid tumors can be slow growing or extremely aggressive. They do not metastasize (move from one body part to another), and if slow growing they can be carefully watched by your physician. However, when they are aggressive they can cause life threatening problems or even death when they compress vital organs such as intestines, kidney, lungs, blood vessels, nerves etc. Desmoid Tumors Are Rare In the [place], approximately 900 people are diagnosed with desmoid tumors every year. This means that out of a million people approximately 2 - 4 people are diagnosed with desmoid tumors each year. Experts believe that the numbers are likely to be far greater because of the difficulty in correctly diagnosing this disease. Because of inconsistent and inaccurate reporting procedures, accurate statistics about the number of desmoid tumor cases have not been kept. Individuals between the ages of 29 and 66 are most often affected, but this disease can occur in anyone. The average age is 30's to 82's. They are slightly more common in women than in men (2:1), and there is no significant racial or ethnic distribution. Causes of Desmoid Tumors The cause of most desmoid tumors is unknown and thus they are called "sporadic". Most desmoids have mutations in a gene called beta catenin. A minority of  desmoid tumors are caused by mutations in a gene called Familial Adenomatous Polyposis or FAP. Patients with mutations in the FAP gene are predisposed to forming hundreds of polyps in the intestines and go on to develop colon cancers. Patients with FAP are often recommended to undergo surgical removal of their intestines. These patients are at a very high risk of developing desmoid tumors which can arise deep in the abdomen or in the abdominal wall. In older scientific literature, the combination of FAP and desmoid tumors is termed Gardner's Syndrome. In some rare cases, desmoid tumors can occur in women who are pregnant. This happens during pregnancy or after a surgical delivery. Many believe that this is caused by a combination of elevated hormones and surgery, however, these is no strong scientific evidence to support this claim. The relationship between pregnancy and desmoid tumors is very rare and consists of mostly anecdotes in the scientific literature. Clinical Presentation Desmoid tumors can develop at virtually any site in the body. Superficial desmoids tend to be less aggressive than desmoids found deep inside the body (i.e., abdominal, extra-abdominal, mesenteric). Superficial desmoid tumors usually manifest themselves as a painless or slightly painful lump. Desmoids inside the abdomen can cause severe pain, rupture of intestines, compression of the kidneys or ureters or rectal bleeding. They can compress critical blood vessels such as the mesenteric vessels and the vena cava. Desmoid tumors may have multiple sites of origin on chest, arms or legs. Diagnosis A biopsy is necessary to diagnose a desmoid tumor. Ultrasound is often the first method of examination of a soft tissue tumor. If the mass is solid, a CT and/or MRI scan  is used to determine whether it adheres to nearby structures and whether it can be safely removed. A variety of options are available for biopsying a suspected desmoid: a core  needle biopsy takes a small piece, usually 1 mm wide by 10 mm long; surgical biopsies may take a portion of the tumor ("incisional biopsy") or may remove all the visible tumor ("excisional biopsy"). While an excisional biopsy may remove all visible tumor, it rarely completely removes all microscopic traces of the tumor - usually leaving a positive margin, that is, tumor at the edge of the biopsy site. Behavior of Desmoid Tumors Although desmoid tumors do not metastasize, the disease may be devastating and occasionally fatal. Desmoid tumors cause major problems when they become locally aggressive, infiltrating and sometimes causing destruction of adjacent organs (intestines, lungs, blood vessels etc). Some desmoids can be indolent and have periods of stability and temporary regression. These need to be watched closely by physicians. Treatment If you are diagnosed with a desmoid tumor it is recommended that you be evaluated in a major, academic hospital with experts in sarcoma. These hospitals are usually designated as [facility] [employer]) centers. Patients with desmoid tumors should be evaluated by a multi-disciplinary team of surgeons, medical oncologists, radiation oncologists, geneticists and nurses.   Surgery When feasible, desmoid tumors are surgically removed. Desmoid tumors can have a high rate of recurrence with surgery alone. While statistics vary, as many as 25 to 40 percent of patients who undergo surgery can have a local recurrence, that is, return of the desmoid at or near the original site. The goal of surgery is to remove all the tumor and minimize the risk of recurrence. A recent nomogram (analysis of risk factors) was published that gives a score on the risk of the desmoid recurring even before the surgery is performed. If the score is high, then the risk of desmoid tumor recurring is high and surgery may not be the best option. However, if the nomogram score is low  then the risk of desmoid recurring is low and surgery may be a good option. The goal is to attempt to obtain tumor-free margins while preserving function and cosmesis. Surgery may be difficult and even impossible for desmoids within the abdomen. Medical therapy (see below) may be considered as an initial alternative, particularly for tumors that involve the intestines, blood vessels, nerves, or organs. The decision to operate is a complicated process that needs to be carefully discussed in a multidisciplinary team of surgeons and doctors at a major sarcoma center.  Radiation Therapy Radiation therapy is an effective option for many patients who cannot have surgery, or as an adjunct to surgery or chemotherapy. The duration of radiation treatment typically is 6 to 8 weeks. Radiographic evidence of tumor shrinkage may take months to years to become apparent. Radiation therapy is often not considered an option in intraabdominal tumors because of the size of the area needed to be irradiated and the risk of radiation damage to vital structures. In some cases, radiation itself can result in other fatal cancers. Therefore, the decision to undergo radiation is a complex one and needs to be discussed carefully. Radiofrequency ablation is an emerging technique where needles are inserted into tumors and radiofrequency waves conducted through the needles lead to intense heating of the tumor. This has also led to some desmoid tumor shrinkage, but the experience to date is limited to a few centers and the long-term results are not yet known. Medical Therapy There is  no single accepted medical treatment for desmoid tumors. Numerous reports of individual cases show shrinkage or stabilization of tumor size or at least improvement in symptoms after a very wide variety of treatments. Chemotherapy is a chemical drug that is usually injected in the veins. A few chemotherapies that are commonly used include: doxorubicin, Doxil,  dacarbazine, methotrexate, vinorelbine and vinblastine. These are toxic chemotherapies that can have a wide range of short and long term side effects. There have also been anecdotal reports of using sulindac (non-steroidal anti-inflammatory) or anti-hormonal agents such as tamoxifen, however the true efficacy of these drugs have not been investigated in clinical trials. New Drugs There are a few sarcoma centers in the country that are dedicated to understanding desmoid tumors and coming up with a cure. A new class of agents called tyrosine kinase inhibitors (such as Gleevec) have shown to be of benefit in some desmoid tumors. Recently there was remarkable activity noted in sorafenib (Nexavar) and a clinical trial is currently under planning. The benefit of these newer agents are that they are oral pills and are more tolerable than cytotoxic chemotherapies.

## 2012-06-01 ENCOUNTER — Telehealth (INDEPENDENT_AMBULATORY_CARE_PROVIDER_SITE_OTHER): Payer: Self-pay | Admitting: General Surgery

## 2012-06-01 NOTE — Telephone Encounter (Signed)
Request for disc of CT left shoulder films be sent to [facility] was faxed on 05/31/12 and confirmation received. Fed Ex overnight to [employer], [facility], 4th floor comprehensive Cancer [facility], [facility] [place] [address]. Fed-ex # [id number]. Billing Acct [id number].

## 2012-06-08 ENCOUNTER — Encounter (INDEPENDENT_AMBULATORY_CARE_PROVIDER_SITE_OTHER): Payer: 59 | Admitting: General Surgery

## 2012-07-15 ENCOUNTER — Ambulatory Visit (INDEPENDENT_AMBULATORY_CARE_PROVIDER_SITE_OTHER): Payer: 59 | Admitting: Internal Medicine

## 2012-07-15 ENCOUNTER — Encounter: Payer: Self-pay | Admitting: Internal Medicine

## 2012-07-15 VITALS — BP 134/86 | HR 82 | Temp 99.5°F | Wt 251.0 lb

## 2012-07-15 DIAGNOSIS — Z9889 Other specified postprocedural states: Secondary | ICD-10-CM

## 2012-07-15 DIAGNOSIS — J042 Acute laryngotracheitis: Secondary | ICD-10-CM

## 2012-07-15 DIAGNOSIS — R490 Dysphonia: Secondary | ICD-10-CM

## 2012-07-15 DIAGNOSIS — J45909 Unspecified asthma, uncomplicated: Secondary | ICD-10-CM

## 2012-07-15 MED ORDER — DOXYCYCLINE HYCLATE 100 MG PO CAPS: 100.0000 mg | ORAL_CAPSULE | Freq: Two times a day (BID) | ORAL | Status: DC

## 2012-07-15 MED ORDER — HYDROCODONE-HOMATROPINE 5-1.5 MG/5ML PO SYRP: 5.0000 mL | ORAL_SOLUTION | ORAL | Status: DC | PRN

## 2012-07-15 NOTE — Progress Notes (Signed)
Chief Complaint  Patient presents with  . Sore Throat    Had a mass removed out of his shoulder last Tuesday.    Marland Kitchen Hoarse    HPI: Patient comes in today for SDA for  new problem evaluation.  pcp NA today .  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 He was intubated during the surgery without complication. This lasted about 3 and half hours. He states that his throat has been sore since that time without associated fever however will last week he's had some cough that's gotten worse. Cough syrup hasn't helped some. He's had some yellow phlegm at times but no blood in the mucus. It hurts very badly into his left shoulder incision area when he coughs. Has a follow up check with the surgeon at the end of the month.  He states that he has a history of asthma and some arthritis problems for which she is on low-dose prednisone. He denies any recent asthma flare does have inhalers.  ROS: See pertinent positives and negatives per HPI. No nausea vomiting diarrhea or shortness of breath.  Past Medical History  Diagnosis Date  . Hyperlipidemia   . Allergy   . Anxiety   . Chronic kidney disease   . Asthma   . Eczema   . Arthritis   . Edema of foot     feet  . Wears dentures     bottom  . Wears glasses     driving  . HOH (hard of hearing)     Family History  Problem Relation Age of Onset  . Arthritis Other   . Hypertension Other   . Osteoporosis Other   . Heart disease Other   . Lung disease Other     History   Social History  . Marital Status: Married    Spouse Name: N/A    Number of Children: N/A  . Years of Education: N/A   Social History Main Topics  . Smoking status: Never Smoker   . Smokeless tobacco: None  . Alcohol Use: Yes     Comment: beer  . Drug Use: No  . Sexually Active:    Other Topics Concern  . None   Social History Narrative  . None    Outpatient Encounter Prescriptions as of 07/15/2012   Medication Sig Dispense Refill  . albuterol (PROAIR HFA) 108 (90 BASE) MCG/ACT inhaler Inhale 2 puffs into the lungs every 6 (six) hours as needed.  18 g  1  . beclomethasone (QVAR) 40 MCG/ACT inhaler Inhale 2 puffs into the lungs 2 (two) times daily.  3 Inhaler  5  . fluticasone (FLONASE) 50 MCG/ACT nasal spray Place 2 sprays into the nose daily.  16 g  11  . hydrochlorothiazide (HYDRODIURIL) 25 MG tablet Take 1 tablet (25 mg total) by mouth daily.  90 tablet  3  . Multiple Vitamin (MULTIVITAMIN) tablet Take 1 tablet by mouth daily.        . predniSONE (DELTASONE) 10 MG tablet Take 1 tablet (10 mg total) by mouth daily.  100 tablet  3  . aspirin 81 MG tablet Take 81 mg by mouth daily.        Marland Kitchen doxycycline (VIBRAMYCIN) 100 MG capsule Take 1 capsule (100 mg total) by mouth 2 (two) times daily.  14 capsule  0  . HYDROcodone-homatropine (HYCODAN) 5-1.5 MG/5ML syrup Take 5 mLs by mouth every 4 (four) hours as needed  for cough.  180 mL  0  . [DISCONTINUED] HYDROcodone-acetaminophen (VICODIN ES) 7.5-750 MG per tablet Take 1 tablet by mouth every 8 (eight) hours as needed for pain.  20 tablet  1  . [DISCONTINUED] oxyCODONE-acetaminophen (ROXICET) 5-325 MG per tablet Take 1-2 tablets by mouth every 4 (four) hours as needed for pain.  40 tablet  0   No facility-administered encounter medications on file as of 07/15/2012.    EXAM:  BP 134/86  Pulse 82  Temp(Src) 99.5 F (37.5 C) (Oral)  Wt 251 lb (113.853 kg)  BMI 35.02 kg/m2  SpO2 96%  Body mass index is 35.02 kg/(m^2).  GENERAL: vitals reviewed and listed above, alert, oriented, appears well hydrated and in no acute distress he has no stridor but mild hoarseness.  HEENT: atraumatic, conjunctiva  clear, no obvious abnormalities on inspection of external nose and ears OP : no lesion edema or exudate slight redness but no lesion  NECK: no obvious masses on inspection palpation no adenopathy  LUNGS: clear to auscultation bilaterally, no  wheezes, rales or rhonchi, good air movement  there is a healing long incision left upper back near shoulder without signs of infection redness or discharge. Breath sounds appear to be equal round this.  CV: HRRR, no clubbing cyanosis or  peripheral edema nl cap refill   MS: moves all extremities except left upper extremity is tender to move  otherwise  PSYCH: pleasant and cooperative, no obvious depression or anxiety  ASSESSMENT AND PLAN:  Discussed the following assessment and plan:  Hoarseness  Acute laryngitis and tracheitis - Probably related to intubation symptoms or not very severe suppose it could be viral respiratory also.  ASTHMA  Post-operative state No evidence currently of significant airway compromise. Cough medicine for comfort increase the prednisone temporarily close observation followup antibiotic needed. -Patient advised to return or notify health care team  if symptoms worsen or persist or new concerns arise.  Patient Instructions  Increase prednisone to 20 - 40 mg for 3 days.  Cough med if it helps. If getting  Lots of discolored mucous then add antibiotic .   This sounds like it could be related to the  Breathing tube from surgery  . If  persistent or progressive contact medical team   For follow up.    Neta Mends. Panosh M.D.

## 2012-07-15 NOTE — Patient Instructions (Addendum)
Increase prednisone to 20 - 40 mg for 3 days.  Cough med if it helps. If getting  Lots of discolored mucous then add antibiotic .   This sounds like it could be related to the  Breathing tube from surgery  . If  persistent or progressive contact medical team   For follow up.

## 2012-08-02 ENCOUNTER — Telehealth: Payer: Self-pay | Admitting: Family Medicine

## 2012-08-02 NOTE — Telephone Encounter (Signed)
Spoke with patient and informed him that Dr Tawanna Cooler does not drain shoulders in the office

## 2012-08-02 NOTE — Telephone Encounter (Signed)
Pt came in and stated that he would like an area on his shoulder drained. He said that previously Dr. Lenis Noon has done it, but Dr. Lenis Noon stated that the PT could go to his primary doctor for this issue. Pt states that area is beginning to get "puffy", and therefor he knows that it needs drained. Please assist.

## 2012-09-12 ENCOUNTER — Other Ambulatory Visit: Payer: Self-pay | Admitting: Family Medicine

## 2013-01-30 ENCOUNTER — Other Ambulatory Visit: Payer: Self-pay | Admitting: Family Medicine

## 2013-03-13 ENCOUNTER — Telehealth: Payer: Self-pay | Admitting: Family Medicine

## 2013-03-13 DIAGNOSIS — R5381 Other malaise: Secondary | ICD-10-CM

## 2013-03-13 DIAGNOSIS — R5383 Other fatigue: Secondary | ICD-10-CM

## 2013-03-13 NOTE — Telephone Encounter (Signed)
Pt would like a cpx before end of yr. Pt has been off of prednisone for 2 month. Pt has hand swelling now.

## 2013-03-14 NOTE — Telephone Encounter (Signed)
We have no more openings for physicals before the end of the year  He can take a another round of prednisone as he needs it for his hand eczema. If he needs refills cut and call in some refills and

## 2013-03-17 ENCOUNTER — Ambulatory Visit (INDEPENDENT_AMBULATORY_CARE_PROVIDER_SITE_OTHER): Payer: 59 | Admitting: Family Medicine

## 2013-03-17 ENCOUNTER — Encounter: Payer: Self-pay | Admitting: Family Medicine

## 2013-03-17 VITALS — BP 144/90 | HR 80 | Temp 98.4°F | Wt 252.0 lb

## 2013-03-17 DIAGNOSIS — L309 Dermatitis, unspecified: Secondary | ICD-10-CM

## 2013-03-17 DIAGNOSIS — L259 Unspecified contact dermatitis, unspecified cause: Secondary | ICD-10-CM

## 2013-03-17 MED ORDER — DESOXIMETASONE 0.25 % EX CREA: 1.0000 "application " | TOPICAL_CREAM | Freq: Two times a day (BID) | CUTANEOUS | Status: DC

## 2013-03-17 NOTE — Telephone Encounter (Signed)
Okay to add testosterone level per Dr Tawanna Cooler.  Order placed.

## 2013-03-17 NOTE — Progress Notes (Signed)
  Subjective:    Patient ID: [name], male    DOB: 03/05/1951, 61 y.o.   MRN: [id number]  HPI Patient is seen chronic problem of very dry and scaly hands. Rash involves the left greater than the right. He has had this apparently for many years. He's used prednisone which seems to have an immediate positive response. He does not have any history of psoriasis. He's never had any pustular changes. Generally does not work around any water or chemicals. He previously tried fluocinonide ointment but had extreme burning. He's recently had some painful cracking of the hands. He tapered himself off prednisone recently because of concerns for long-term use. He does not have any other skin rash.  He has tried over-the-counter moisturizer creams which have not helped much.  Past Medical History  Diagnosis Date  . Hyperlipidemia   . Allergy   . Anxiety   . Chronic kidney disease   . Asthma   . Eczema   . Arthritis   . Edema of foot     feet  . Wears dentures     bottom  . Wears glasses     driving  . HOH (hard of hearing)    Past Surgical History  Procedure Laterality Date  . Cervical fusion  2008    c6-7  . Nose surgery      fx as child  . Orif ulnar fracture      rt as child  . Colonoscopy    . Finger fracture surgery      as child  . Lipoma excision  05/17/2012    Procedure: EXCISION LIPOMA;  Surgeon: Atilano Ina, MD,FACS;  Location: [facility];  Service: General;  Laterality: Left;  incisional biopsy of left shoulder soft tissue mass    reports that he has never smoked. He does not have any smokeless tobacco history on file. He reports that he drinks alcohol. He reports that he does not use illicit drugs. family history includes Arthritis in his other; Heart disease in his other; Hypertension in his other; Lung disease in his other; Osteoporosis in his other. Allergies  Allergen Reactions  . Sesame Oil Itching  . Tobramycin     Used in an eye oint-reaction       Review of Systems  Constitutional: Negative for fever and chills.  Skin: Positive for rash.       Objective:   Physical Exam  Constitutional: He appears well-developed and well-nourished.  Cardiovascular: Normal rate.   Pulmonary/Chest: Effort normal and breath sounds normal. No respiratory distress. He has no wheezes. He has no rales.  Skin: Rash noted.  Patient is very dry skin especially involving hands. Right index finger reveals some fairly well-demarcated scaling and dryness distally. He has similar patches left hand involving second, third, and fifth digits. Sparing of the dorsum of the hand or palms. No pustules. No vesicles. No other skin rashes noted          Assessment & Plan:  Patient has chronic hand dermatitis. No clear triggering factors. This does not have the appearance of pustular psoriasis. He is responded promptly in the past to prednisone so fungal etiology would be very unlikely. We've recommended Topicort 0.25% cream twice a day no longer than 2 weeks continuous use. He'll start back brief prednisone 10 mg daily for one week. Followup with primary in 2-3 weeks to reassess. Keep hands out of water as much as possible.

## 2013-03-17 NOTE — Progress Notes (Signed)
Pre visit review using our clinic review tool, if applicable. No additional management support is needed unless otherwise documented below in the visit note. 

## 2013-03-17 NOTE — Telephone Encounter (Signed)
Pt has been sch for cpx and labs. Pt would like to have testosterone level check. Can I add to cpx labs?

## 2013-03-17 NOTE — Patient Instructions (Signed)
Go back on prednisone 10 mg daily for one week and start Topicort cream Try to keep hands out of water as much as possible Leave off all other topicals except for topical moisturizers such as LacHydrin.

## 2013-03-17 NOTE — Telephone Encounter (Signed)
Pt has been sch

## 2013-03-29 ENCOUNTER — Other Ambulatory Visit: Payer: 59

## 2013-03-29 ENCOUNTER — Other Ambulatory Visit (INDEPENDENT_AMBULATORY_CARE_PROVIDER_SITE_OTHER): Payer: 59

## 2013-03-29 DIAGNOSIS — Z0000 Encounter for general adult medical examination without abnormal findings: Secondary | ICD-10-CM

## 2013-03-29 DIAGNOSIS — R5383 Other fatigue: Secondary | ICD-10-CM

## 2013-03-29 DIAGNOSIS — R5381 Other malaise: Secondary | ICD-10-CM

## 2013-03-29 LAB — POCT URINALYSIS DIPSTICK
Bilirubin, UA: NEGATIVE
Glucose, UA: NEGATIVE
Ketones, UA: NEGATIVE
Leukocytes, UA: NEGATIVE
Nitrite, UA: NEGATIVE
Protein, UA: NEGATIVE
Spec Grav, UA: 1.025
Urobilinogen, UA: 0.2
pH, UA: 5.5

## 2013-03-29 LAB — HEPATIC FUNCTION PANEL
ALT: 42 U/L (ref 0–53)
AST: 32 U/L (ref 0–37)
Albumin: 4.1 g/dL (ref 3.5–5.2)
Alkaline Phosphatase: 54 U/L (ref 39–117)
Bilirubin, Direct: 0.2 mg/dL (ref 0.0–0.3)
Total Bilirubin: 1.2 mg/dL (ref 0.3–1.2)
Total Protein: 6.7 g/dL (ref 6.0–8.3)

## 2013-03-29 LAB — BASIC METABOLIC PANEL
BUN: 13 mg/dL (ref 6–23)
Creatinine, Ser: 1.2 mg/dL (ref 0.4–1.5)
GFR: 67.39 mL/min (ref >60.00)

## 2013-03-29 LAB — LIPID PANEL
Cholesterol: 206 mg/dL — ABNORMAL HIGH (ref 0–200)
HDL: 36.9 mg/dL — ABNORMAL LOW
Total CHOL/HDL Ratio: 6
Triglycerides: 108 mg/dL (ref 0.0–149.0)
VLDL: 21.6 mg/dL (ref 0.0–40.0)

## 2013-03-29 LAB — CBC WITH DIFFERENTIAL/PLATELET
Eosinophils Relative: 4.6 % (ref 0.0–5.0)
Monocytes Absolute: 0.8 10*3/uL (ref 0.1–1.0)
Monocytes Relative: 9.7 % (ref 3.0–12.0)
Neutrophils Relative %: 76.3 % (ref 43.0–77.0)
Platelets: 216 10*3/uL (ref 150.0–400.0)
WBC: 8.3 10*3/uL (ref 4.5–10.5)

## 2013-03-29 LAB — TSH: TSH: 3.33 u[IU]/mL (ref 0.35–5.50)

## 2013-03-29 LAB — PSA: PSA: 0.19 ng/mL (ref 0.10–4.00)

## 2013-03-29 LAB — LDL CHOLESTEROL, DIRECT: Direct LDL: 159 mg/dL

## 2013-04-01 ENCOUNTER — Other Ambulatory Visit: Payer: Self-pay | Admitting: Family Medicine

## 2013-04-05 ENCOUNTER — Encounter: Payer: Self-pay | Admitting: Family

## 2013-04-05 ENCOUNTER — Ambulatory Visit (INDEPENDENT_AMBULATORY_CARE_PROVIDER_SITE_OTHER): Payer: 59 | Admitting: Family

## 2013-04-05 ENCOUNTER — Other Ambulatory Visit: Payer: Self-pay | Admitting: Family

## 2013-04-05 VITALS — BP 136/68 | HR 76 | Ht 71.0 in | Wt 256.0 lb

## 2013-04-05 DIAGNOSIS — E291 Testicular hypofunction: Secondary | ICD-10-CM

## 2013-04-05 DIAGNOSIS — I10 Essential (primary) hypertension: Secondary | ICD-10-CM

## 2013-04-05 DIAGNOSIS — J209 Acute bronchitis, unspecified: Secondary | ICD-10-CM

## 2013-04-05 DIAGNOSIS — E7800 Pure hypercholesterolemia, unspecified: Secondary | ICD-10-CM | POA: Insufficient documentation

## 2013-04-05 DIAGNOSIS — Z0000 Encounter for general adult medical examination without abnormal findings: Secondary | ICD-10-CM

## 2013-04-05 MED ORDER — HYDROCODONE-HOMATROPINE 5-1.5 MG/5ML PO SYRP: 5.0000 mL | ORAL_SOLUTION | Freq: Three times a day (TID) | ORAL | Status: DC | PRN

## 2013-04-05 MED ORDER — PREDNISONE 20 MG PO TABS: 60.0000 mg | ORAL_TABLET | Freq: Every day | ORAL | Status: DC

## 2013-04-05 NOTE — Progress Notes (Signed)
Subjective:    Patient ID: [name], male    DOB: 1951-03-09, 62 y.o.   MRN: [id number]  HPI 62 year old WM, nonsmoker, patient of Dr. Tawanna Cooler is in today for yearly preventative medicine examination. All immunizations and health maintenance protocols were reviewed with the patient and they are up to date with these protocols. Screening laboratory values were reviewed with the patient including screening of hyperlipidemia PSA renal function and hepatic function. There medications past medical history social history problem list and allergies were reviewed in detail. Goals were established with regard to weight loss exercise diet in compliance with medications   Review of Systems  Constitutional: Negative.   HENT: Negative.   Eyes: Negative.   Respiratory: Positive for cough, shortness of breath and wheezing. Negative for chest tightness.   Cardiovascular: Negative.   Gastrointestinal: Negative.   Endocrine: Negative.   Genitourinary: Negative.   Musculoskeletal: Negative.   Skin: Negative.   Allergic/Immunologic: Negative.   Neurological: Negative.   Hematological: Negative.   Psychiatric/Behavioral: Negative.    Past Medical History  Diagnosis Date  . Hyperlipidemia   . Allergy   . Anxiety   . Chronic kidney disease   . Asthma   . Eczema   . Arthritis   . Edema of foot     feet  . Wears dentures     bottom  . Wears glasses     driving  . HOH (hard of hearing)     History   Social History  . Marital Status: Married    Spouse Name: N/A    Number of Children: N/A  . Years of Education: N/A   Occupational History  . Not on file.   Social History Main Topics  . Smoking status: Never Smoker   . Smokeless tobacco: Not on file  . Alcohol Use: Yes     Comment: beer  . Drug Use: No  . Sexual Activity:    Other Topics Concern  . Not on file   Social History Narrative  . No narrative on file    Past Surgical History  Procedure Laterality Date  .  Cervical fusion  2008    c6-7  . Nose surgery      fx as child  . Orif ulnar fracture      rt as child  . Colonoscopy    . Finger fracture surgery      as child  . Lipoma excision  05/17/2012    Procedure: EXCISION LIPOMA;  Surgeon: Atilano Ina, MD,FACS;  Location: [facility];  Service: General;  Laterality: Left;  incisional biopsy of left shoulder soft tissue mass    Family History  Problem Relation Age of Onset  . Arthritis Other   . Hypertension Other   . Osteoporosis Other   . Heart disease Other   . Lung disease Other     Allergies  Allergen Reactions  . Sesame Oil Itching  . Tobramycin     Used in an eye oint-reaction    Current Outpatient Prescriptions on File Prior to Visit  Medication Sig Dispense Refill  . albuterol (PROAIR HFA) 108 (90 BASE) MCG/ACT inhaler Inhale 2 puffs into the lungs every 6 (six) hours as needed.  18 g  1  . aspirin 81 MG tablet Take 81 mg by mouth daily.        . beclomethasone (QVAR) 40 MCG/ACT inhaler Inhale 2 puffs into the lungs 2 (two) times daily.  3  Inhaler  5  . desoximetasone (TOPICORT) 0.25 % cream Apply 1 application topically 2 (two) times daily.  60 g  1  . fluticasone (FLONASE) 50 MCG/ACT nasal spray USE 2 SPRAYS INTO NOSE DAILY  16 g  0  . hydrochlorothiazide (HYDRODIURIL) 25 MG tablet TAKE 1 TABLET (25 MG TOTAL) BY MOUTH DAILY.  90 tablet  0  . predniSONE (DELTASONE) 10 MG tablet TAKE 1 TABLET BY MOUTH EVERY DAY... MAX OF 30 DAYS ON INSURANCE  100 tablet  0   No current facility-administered medications on file prior to visit.    BP 136/68  Pulse 76  Ht 5\' 11"  (1.803 m)  Wt 256 lb (116.121 kg)  BMI 35.72 kg/m2  SpO2 97%chart    Objective:   Physical Exam  Constitutional: He is oriented to person, place, and time. He appears well-developed and well-nourished.  HENT:  Head: Normocephalic and atraumatic.  Right Ear: External ear normal.  Left Ear: External ear normal.  Eyes: Conjunctivae and EOM are  normal. Pupils are equal, round, and reactive to light.  Neck: Normal range of motion. Neck supple. No thyromegaly present.  Cardiovascular: Normal rate, regular rhythm and normal heart sounds.   Pulmonary/Chest: Effort normal. He has wheezes.  Abdominal: Soft. Bowel sounds are normal.  Genitourinary: Rectum normal, prostate normal and penis normal. Guaiac negative stool. No penile tenderness.  Musculoskeletal: Normal range of motion.  Neurological: He is alert and oriented to person, place, and time. He has normal reflexes. He displays normal reflexes. No cranial nerve deficit. Coordination normal.  Skin: Skin is warm and dry.  Psychiatric: He has a normal mood and affect.          Assessment & Plan:  Assessment:  1. CPX 2. Acute Bronchitis 3. Hypogonadism  Plan: Encouraged healthy diet, exercise, weight reduction. Low cholesterol diet. Refer to urology for management of hypogonadism per Dr. Tawanna Cooler recommendations. We'll treat bronchitis with prednisone 60 mg daily x5 days then resume 10 mg dosage as a maintenance. Patient to the office in any questions or concerns. Recheck in 6 months and sooner as needed.

## 2013-04-05 NOTE — Patient Instructions (Signed)

## 2013-05-01 ENCOUNTER — Emergency Department (HOSPITAL_COMMUNITY)
Admission: EM | Admit: 2013-05-01 | Discharge: 2013-05-01 | Disposition: A | Discharge status: Home / Self Care | Payer: 59 | Attending: Emergency Medicine | Admitting: Emergency Medicine

## 2013-05-01 ENCOUNTER — Encounter (HOSPITAL_COMMUNITY): Payer: Self-pay | Admitting: Emergency Medicine

## 2013-05-01 ENCOUNTER — Emergency Department (HOSPITAL_COMMUNITY): Payer: 59

## 2013-05-01 ENCOUNTER — Encounter: Payer: Self-pay | Admitting: Family Medicine

## 2013-05-01 ENCOUNTER — Ambulatory Visit (INDEPENDENT_AMBULATORY_CARE_PROVIDER_SITE_OTHER): Payer: 59 | Admitting: Family Medicine

## 2013-05-01 VITALS — BP 120/84 | Temp 98.9°F | Wt 238.0 lb

## 2013-05-01 DIAGNOSIS — N509 Disorder of male genital organs, unspecified: Secondary | ICD-10-CM | POA: Insufficient documentation

## 2013-05-01 DIAGNOSIS — K5900 Constipation, unspecified: Secondary | ICD-10-CM | POA: Insufficient documentation

## 2013-05-01 DIAGNOSIS — R81 Glycosuria: Secondary | ICD-10-CM

## 2013-05-01 DIAGNOSIS — Z8669 Personal history of other diseases of the nervous system and sense organs: Secondary | ICD-10-CM | POA: Insufficient documentation

## 2013-05-01 DIAGNOSIS — N201 Calculus of ureter: Secondary | ICD-10-CM

## 2013-05-01 DIAGNOSIS — N189 Chronic kidney disease, unspecified: Secondary | ICD-10-CM | POA: Insufficient documentation

## 2013-05-01 DIAGNOSIS — R110 Nausea: Secondary | ICD-10-CM | POA: Insufficient documentation

## 2013-05-01 DIAGNOSIS — J45909 Unspecified asthma, uncomplicated: Secondary | ICD-10-CM | POA: Insufficient documentation

## 2013-05-01 DIAGNOSIS — R1012 Left upper quadrant pain: Secondary | ICD-10-CM

## 2013-05-01 DIAGNOSIS — Z872 Personal history of diseases of the skin and subcutaneous tissue: Secondary | ICD-10-CM | POA: Insufficient documentation

## 2013-05-01 DIAGNOSIS — R300 Dysuria: Secondary | ICD-10-CM

## 2013-05-01 DIAGNOSIS — IMO0002 Reserved for concepts with insufficient information to code with codable children: Secondary | ICD-10-CM | POA: Insufficient documentation

## 2013-05-01 DIAGNOSIS — M129 Arthropathy, unspecified: Secondary | ICD-10-CM | POA: Insufficient documentation

## 2013-05-01 DIAGNOSIS — Z7982 Long term (current) use of aspirin: Secondary | ICD-10-CM | POA: Insufficient documentation

## 2013-05-01 DIAGNOSIS — Z8639 Personal history of other endocrine, nutritional and metabolic disease: Secondary | ICD-10-CM | POA: Insufficient documentation

## 2013-05-01 DIAGNOSIS — Z862 Personal history of diseases of the blood and blood-forming organs and certain disorders involving the immune mechanism: Secondary | ICD-10-CM | POA: Insufficient documentation

## 2013-05-01 DIAGNOSIS — Z8659 Personal history of other mental and behavioral disorders: Secondary | ICD-10-CM | POA: Insufficient documentation

## 2013-05-01 DIAGNOSIS — Z79899 Other long term (current) drug therapy: Secondary | ICD-10-CM | POA: Insufficient documentation

## 2013-05-01 LAB — CBC WITH DIFFERENTIAL/PLATELET
BASOS PCT: 0 % (ref 0–1)
Basophils Absolute: 0 10*3/uL (ref 0.0–0.1)
EOS ABS: 0.1 10*3/uL (ref 0.0–0.7)
EOS PCT: 1 % (ref 0–5)
HEMATOCRIT: 44.3 % (ref 39.0–52.0)
HEMOGLOBIN: 15.9 g/dL (ref 13.0–17.0)
LYMPHS ABS: 1.1 10*3/uL (ref 0.7–4.0)
Lymphocytes Relative: 9 % — ABNORMAL LOW (ref 12–46)
MCH: 32.9 pg (ref 26.0–34.0)
MCHC: 35.9 g/dL (ref 30.0–36.0)
MCV: 91.7 fL (ref 78.0–100.0)
MONO ABS: 1.1 10*3/uL — AB (ref 0.1–1.0)
Monocytes Relative: 9 % (ref 3–12)
Neutro Abs: 9.3 10*3/uL — ABNORMAL HIGH (ref 1.7–7.7)
Neutrophils Relative %: 80 % — ABNORMAL HIGH (ref 43–77)
Platelets: 267 10*3/uL (ref 150–400)
RBC: 4.83 MIL/uL (ref 4.22–5.81)
RDW: 12.4 % (ref 11.5–15.5)
WBC: 11.5 10*3/uL — ABNORMAL HIGH (ref 4.0–10.5)

## 2013-05-01 LAB — POCT URINALYSIS DIPSTICK
Bilirubin, UA: NEGATIVE
Blood, UA: NEGATIVE
GLUCOSE UA: 250
Ketones, UA: NEGATIVE
LEUKOCYTES UA: NEGATIVE
Nitrite, UA: NEGATIVE
PROTEIN UA: NEGATIVE
Spec Grav, UA: <=1.005
UROBILINOGEN UA: 0.2
pH, UA: 6.5

## 2013-05-01 LAB — COMPREHENSIVE METABOLIC PANEL
ALBUMIN: 4.2 g/dL (ref 3.5–5.2)
ALT: 29 U/L (ref 0–53)
AST: 17 U/L (ref 0–37)
Alkaline Phosphatase: 59 U/L (ref 39–117)
BUN: 16 mg/dL (ref 6–23)
CO2: 25 mEq/L (ref 19–32)
CREATININE: 1.91 mg/dL — AB (ref 0.50–1.35)
Calcium: 9.3 mg/dL (ref 8.4–10.5)
Chloride: 97 mEq/L (ref 96–112)
GFR calc Af Amer: 41 mL/min — ABNORMAL LOW (ref >90)
GFR calc non Af Amer: 35 mL/min — ABNORMAL LOW (ref >90)
Glucose, Bld: 98 mg/dL (ref 70–99)
Potassium: 4.3 mEq/L (ref 3.7–5.3)
Sodium: 136 mEq/L — ABNORMAL LOW (ref 137–147)
TOTAL PROTEIN: 7.6 g/dL (ref 6.0–8.3)
Total Bilirubin: 0.7 mg/dL (ref 0.3–1.2)

## 2013-05-01 LAB — URINALYSIS, ROUTINE W REFLEX MICROSCOPIC
Bilirubin Urine: NEGATIVE
Glucose, UA: NEGATIVE mg/dL
KETONES UR: NEGATIVE mg/dL
NITRITE: NEGATIVE
PH: 6 (ref 5.0–8.0)
Protein, ur: NEGATIVE mg/dL
SPECIFIC GRAVITY, URINE: 1.012 (ref 1.005–1.030)
Urobilinogen, UA: 0.2 mg/dL (ref 0.0–1.0)

## 2013-05-01 LAB — URINE MICROSCOPIC-ADD ON

## 2013-05-01 LAB — GLUCOSE, POCT (MANUAL RESULT ENTRY): POC GLUCOSE: 158 mg/dL — AB (ref 70–99)

## 2013-05-01 LAB — LIPASE, BLOOD: LIPASE: 21 U/L (ref 11–59)

## 2013-05-01 MED ORDER — OXYCODONE-ACETAMINOPHEN 5-325 MG PO TABS
2.0000 | ORAL_TABLET | Freq: Once | ORAL | Status: AC
  Administered 2013-05-01: 2 via ORAL
  Filled 2013-05-01: qty 2

## 2013-05-01 MED ORDER — OXYCODONE-ACETAMINOPHEN 5-325 MG PO TABS: 1.0000 | ORAL_TABLET | Freq: Four times a day (QID) | ORAL | Status: DC | PRN

## 2013-05-01 MED ORDER — ONDANSETRON HCL 4 MG PO TABS: 4.0000 mg | ORAL_TABLET | Freq: Four times a day (QID) | ORAL | Status: DC

## 2013-05-01 MED ORDER — MORPHINE SULFATE 4 MG/ML IJ SOLN
4.0000 mg | Freq: Once | INTRAMUSCULAR | Status: AC
  Administered 2013-05-01: 4 mg via INTRAVENOUS
  Filled 2013-05-01: qty 1

## 2013-05-01 MED ORDER — ONDANSETRON HCL 4 MG/2ML IJ SOLN
4.0000 mg | Freq: Once | INTRAMUSCULAR | Status: AC
  Administered 2013-05-01: 4 mg via INTRAVENOUS
  Filled 2013-05-01: qty 2

## 2013-05-01 NOTE — Progress Notes (Signed)
   Subjective:    Patient ID: [name], male    DOB: 05-25-1950, 63 y.o.   MRN: [id number]  HPI [name] is a 63 year old married male nonsmoker who comes in today for evaluation of abdominal pain  He says 3 days ago he was working at home and developed the sudden onset of left upper quadrant abdominal pain. The pain initially was a dull ache however last night he became constant heard all night and was an 8 on a scale of 1-10. He's not had a bowel movement since Saturday. No fever chills nausea vomiting or diarrhea. She's had a history kidney stones in the past but he says the pain is different than the normal kidney stones. [place] and he points to his right flank as a source of the pain. He states he had a colonoscopy within last 10 years with reported to be normal   Review of Systems Review of systems otherwise negative    Objective:   Physical Exam Well-developed well-nourished male no acute distress vital signs stable is afebrile examination the abdomen and is soft the bowel sounds are normal there is no rebound tenderness left upper quadrant the appear rectum normal stool guaiac-negative       Assessment & Plan:  Left upper quadrant abdominal pain for 3 days......... to the emergency room for eval

## 2013-05-01 NOTE — Patient Instructions (Signed)
Go directly to [place] emergency room 

## 2013-05-01 NOTE — ED Notes (Signed)
Patient back from CT Patient denies complaints or needs at this time and appears in NAD Side rails up, call bell in reach

## 2013-05-01 NOTE — Discharge Instructions (Signed)
Kidney Stones Kidney stones (urolithiasis) are solid masses that form inside your kidneys. The intense pain is caused by the stone moving through the kidney, ureter, bladder, and urethra (urinary tract). When the stone moves, the ureter starts to spasm around the stone. The stone is usually passed in your pee (urine).  HOME CARE  Drink enough fluids to keep your pee clear or pale yellow. This helps to get the stone out.  Strain all pee through the provided strainer. Do not pee without peeing through the strainer, not even once. If you pee the stone out, catch it in the strainer. The stone may be as small as a grain of salt. Take this to your doctor. This will help your doctor figure out what you can do to try to prevent more kidney stones.  Only take medicine as told by your doctor.  Follow up with your doctor as told.  Get follow-up X-rays as told by your doctor. GET HELP IF: You have pain that gets worse even if you have been taking pain medicine. GET HELP RIGHT AWAY IF:   Your pain does not get better with medicine.  You have a fever or shaking chills.  Your pain increases and gets worse over 18 hours.  You have new belly (abdominal) pain.  You feel faint or pass out.  You are unable to pee. MAKE SURE YOU:   Understand these instructions.  Will watch your condition.  Will get help right away if you are not doing well or get worse. Document Released: 09/30/2007 Document Revised: 12/14/2012 Document Reviewed: 09/14/2012 [facility] Patient Information 2014 [place], [place].  Lithotripsy Care After Refer to this sheet for the next few weeks. These discharge instructions provide you with general information on caring for yourself after you leave the hospital. Your caregiver may also give you specific instructions. Your treatment has been planned according to the most current medical practices available, but unavoidable complications sometimes occur. If you have any problems or  questions after discharge, please call your caregiver. AFTER THE PROCEDURE   The recovery time will vary with the procedure done.  You will be taken to the recovery area. A nurse will watch and check your progress. Once you are awake, stable, and taking fluids well, you will be allowed to go home as long as there are no problems.  Your urine may have a red tinge for a few days after treatment. Blood loss is usually minimal.  You may have soreness in the back or flank area. This usually goes away after a few days. The procedure can cause blotches or bruises on the back where the pressure wave enters the skin. These marks usually cause only minimal discomfort and should disappear in a short time.  Stone fragments should begin to pass within 24 hours of treatment. However, a delayed passage is not unusual.  You may have pain, discomfort, and feel sick to your stomach (nauseous) when the crushed (pulverized) fragments of stone are passed down the tube from the kidney to the bladder. Stone fragments can pass soon after the procedure and may last for up to 4 to 8 weeks.  A small number of patients may have severe pain when stone fragments are not able to pass, which leads to an obstruction.  If your stone is greater than 1 inch/2.5 centimeters in diameter or if you have multiple stones that have a combined diameter greater than 1 inch/2.5 centimeters, you may require more than 1 treatment.  You must have  someone drive you home. HOME CARE INSTRUCTIONS   Rest at home until you feel your energy improving.  Only take over-the-counter or prescription medicines for pain, discomfort, or fever as directed by your caregiver. Depending on the type of lithotripsy, you may need to take medicines (antibiotics) that kill germs and anti-inflammatory medicines for a few days.  Drink enough water and fluids to keep your urine clear or pale yellow. This helps "flush" your kidneys. It helps pass any remaining  pieces of stone and prevents stones from coming back.  Most people can resume daily activities within 1 or 2 days after standard lithotripsy. It can take longer to recover from laser and percutaneous lithotripsy.  If the stones are in your urinary system, you may be asked to strain your urine at home to look for stones. Any stones that are found can be sent to a medical lab for examination.  Visit your caregiver for a follow-up appointment in a few weeks. Your doctor may remove your stent if you have one. Your caregiver will also check to see whether stone particles still remain. SEEK MEDICAL CARE IF:   Your pain is not relieved by medicine.  You have a lasting nauseous feeling.  You feel there is too much blood in the urine.  You develop persistent problems with frequent and/or painful urination that does not at least partially improve after 2 days following the procedure.  You have a congested cough.  You feel lightheaded.  You develop a rash or any other signs that might suggest an allergic problem.  You develop any reaction or side effects to your medicine(s). SEEK IMMEDIATE MEDICAL CARE IF:   You experience severe back and/or flank pain.  You see nothing but blood when you urinate.  You cannot pass any urine at all.  You have a fever.  You develop shortness of breath, difficulty breathing, or chest pain.  You develop vomiting that will not stop after 6 to 8 hours.  You have a fainting episode. MAKE SURE YOU:   Understand these instructions.  Will watch your condition.  Will get help right away if you are not doing well or get worse. Document Released: 05/03/2007 Document Revised: 07/06/2011 Document Reviewed: 10/27/2012 [facility] Patient Information 2014 [place], [place].  Diet for Kidney Stones Kidney stones are small, hard masses that form inside your kidneys. They are made up of salts and minerals and often form when high levels build up in the urine. The  minerals can then start to build up, crystalize, and stick together to form stones. There are several different types of kidney stones. The following types of stones may be influenced by dietary factors:   Calcium Oxalate Stones. An oxalate is a salt found in certain foods. Within the body, calcium can combine with oxalates to form calcium oxalate stones, which can be excreted in the urine in high amounts. This is the most common type of kidney stone.  Calcium Phosphate Stones. These stones may occur when the pH of the urine becomes too high, or less acidic, from too much calcium being excreted in the urine. The pH is a measure of how acidic or basic a substance is.  Uric Acid Stones. This type of stone occurs when the pH of the urine becomes too low, or very acidic, because substances called purines build up in the urine. Purines are found in animal proteins. When the urine is highly concentrated with acid, uric acid kidney stones can form.  Other risk factors  for kidney stones include genetics, environment, and being overweight. Your caregiver may ask you to follow specific diet guidelines based on the type of stone you have to lessen the chances of your body making more kidney stones.  GENERAL GUIDELINES FOR ALL TYPES OF STONES  Drink plenty of fluid. Drink 12 16 cups of fluid a day, drinking mainly water.This is the most important thing you can do to prevent the formation of future kidney stones.  Maintain a healthy weight. Your caregiver or dietitian can help you determine what a healthy weight is for you. If you are overweight, weight loss may help prevent the formation of future kidney stones.  Eat a diet adequate in animal protein. Too much animal protein can contribute to the formation of stones. Your dietitian can help you determine how much protein you should be eating. Avoid low carbohydrate, high protein diets.  Follow a balanced eating approach. The DASH diet, which stands for  "Dietary Approaches to Stop Hypertension," is an effective meal plan for reducing stone formation. This diet is high in fruits, vegetables, dairy, and whole grains and low in animal protein. Ask your caregiver or dietitian for information about the DASH diet. ADDITIONAL DIET GUIDELINES FOR CALCIUM STONES Avoid foods high in salt. This includes table salt, salt seasonings, MSG, soy sauce, cured and processed meats, salted crackers and snack foods, fast food, and canned soups and foods. Ask your caregiver or dietitian for information about reducing sodium in your diet or following the low sodium diet.  Ensure adequate calcium intake. Use the following table for calcium guidelines:  Men 29 years old and younger  1000 mg/day.  Men 54 years old and older  1500 mg/day.  Women 91 65 years old  1000 mg/day.  Women 50 years and older  1500 mg/day. Your dietitian can help you determine if you are getting enough calcium in your diet. Foods that are high in calcium include dairy products, broccoli, cheese, yogurt, and pudding. If you need to take a calcium supplement, take it only in the form of calcium citrate.  Avoid foods high in oxalate. Be sure that any supplements you take do not contain more than 500 mg of vitamin C. Vitamin C is converted into oxalate in the body. You do not need to avoid fruits and vegetables high in vitamin C.   Grains: High-fiber or bran cereal, whole-wheat bread, grits, barley, buckwheat, amaranth, pretzels, and fruitcake.  Vegetables: Dried beans, wax beans, dark leafy greens, eggplant, leeks, okra, parsley, rutabaga, tomato paste, watercress, zucchini, and escarole.  Fruit: Dried apricots, red currants, figs, kiwi, and rhubarb.  Meat and Meat Substitutes: Soybeans and foods made from soy (soyburger, miso), dried beans, peanut butter.  Milk: Chocolate milk mixes and soymilk.  Fats and Oils: Nuts (peanuts, almonds, pecans, cashews, hazelnuts) and nut butters, sesame seeds,  and tDahini paste.  Condiments/Miscellaneous: Chocolate, carob, marmalade, poppy seeds, instant iced tea, and juice from high-oxalate fruits.  Document Released: 08/08/2010 Document Revised: 10/13/2011 Document Reviewed: 09/28/2011 [facility] Patient Information 2014 [place].

## 2013-05-01 NOTE — Progress Notes (Signed)
Pre visit review using our clinic review tool, if applicable. No additional management support is needed unless otherwise documented below in the visit note. 

## 2013-05-01 NOTE — ED Notes (Signed)
Patient medicated, see MAR PA at bedside  Patient made aware of POC--agrees and v/u

## 2013-05-01 NOTE — ED Notes (Signed)
Pt c/o left flank pain that radiates to left abd/groin area and testicular pain since Thursday. Pt has PMH kidney stones. Pt states he was nauseated last nigth but neve vomited.

## 2013-05-01 NOTE — Addendum Note (Signed)
Addended by: Westley Hummer B on: 05/01/2013 05:08 PM   Modules accepted: Orders

## 2013-05-01 NOTE — ED Provider Notes (Signed)
CSN: [id number]     Arrival date & time 05/01/13  1410 History   First MD Initiated Contact with Patient 05/01/13 1705     Chief Complaint  Patient presents with  . Flank Pain    left  . Abdominal Pain  . Testicle Pain   (Consider location/radiation/quality/duration/timing/severity/associated sxs/prior Treatment) HPI Comments: Patient is a 63 year old male with history of kidney stones, chronic kidney disease who presents today with left-sided flank pain. This has been ongoing since Saturday. The pain is intermittent. Nothing seems to trigger the pain, but it is worse at night. The pain is sharp and radiates from his back into his left upper quadrant and down into his left testicle. He has associated nausea, with no vomiting. He generally has 2 large bowel movements a day, but feels as though he is mildly constipated. He has only had small bowel movements over the past 2 days. He denies fever, chills, hematuria, dysuria, sensation of incomplete emptying.  He saw his primary care physician earlier today who sent him to the emergency department for further evaluation.  Patient is a 63 y.o. male presenting with flank pain, abdominal pain, and testicular pain. The history is provided by the patient. No language interpreter was used.  Flank Pain Associated symptoms include abdominal pain and nausea. Pertinent negatives include no chest pain, chills, fever or vomiting.  Abdominal Pain Associated symptoms: constipation and nausea   Associated symptoms: no chest pain, no chills, no diarrhea, no dysuria, no fever, no hematuria, no shortness of breath and no vomiting   Testicle Pain Associated symptoms include abdominal pain and nausea. Pertinent negatives include no chest pain, chills, fever or vomiting.    Past Medical History  Diagnosis Date  . Hyperlipidemia   . Allergy   . Anxiety   . Chronic kidney disease   . Asthma   . Eczema   . Arthritis   . Edema of foot     feet  . Wears dentures       bottom  . Wears glasses     driving  . HOH (hard of hearing)    Past Surgical History  Procedure Laterality Date  . Cervical fusion  2008    c6-7  . Nose surgery      fx as child  . Orif ulnar fracture      rt as child  . Colonoscopy    . Finger fracture surgery      as child  . Lipoma excision  05/17/2012    Procedure: EXCISION LIPOMA;  Surgeon: Gayland Curry, MD,FACS;  Location: [place];  Service: General;  Laterality: Left;  incisional biopsy of left shoulder soft tissue mass   Family History  Problem Relation Age of Onset  . Arthritis Other   . Hypertension Other   . Osteoporosis Other   . Heart disease Other   . Lung disease Other    History  Substance Use Topics  . Smoking status: Never Smoker   . Smokeless tobacco: Not on file  . Alcohol Use: Yes     Comment: beer    Review of Systems  Constitutional: Negative for fever and chills.  Respiratory: Negative for shortness of breath.   Cardiovascular: Negative for chest pain.  Gastrointestinal: Positive for nausea, abdominal pain and constipation. Negative for vomiting, diarrhea, blood in stool and anal bleeding.  Genitourinary: Positive for flank pain and testicular pain. Negative for dysuria, hematuria and difficulty urinating.  All other systems reviewed and are  negative.    Allergies  Sesame oil and Tobramycin  Home Medications   Current Outpatient Rx  Name  Route  Sig  Dispense  Refill  . albuterol (PROVENTIL HFA;VENTOLIN HFA) 108 (90 BASE) MCG/ACT inhaler   Inhalation   Inhale 2 puffs into the lungs every 6 (six) hours as needed for wheezing or shortness of breath.         Marland Kitchen aspirin 81 MG tablet   Oral   Take 81 mg by mouth daily.           . beclomethasone (QVAR) 40 MCG/ACT inhaler   Inhalation   Inhale 2 puffs into the lungs 2 (two) times daily.   3 Inhaler   5   . Cyanocobalamin (VITAMIN B 12 PO)   Oral   Take 1 tablet by mouth daily.         Marland Kitchen desoximetasone  (TOPICORT) 0.25 % cream   Topical   Apply 1 application topically 2 (two) times daily.   60 g   1   . fluticasone (FLONASE) 50 MCG/ACT nasal spray      USE 2 SPRAYS INTO NOSE DAILY   16 g   0   . hydrochlorothiazide (HYDRODIURIL) 25 MG tablet      TAKE 1 TABLET (25 MG TOTAL) BY MOUTH DAILY.   90 tablet   0     Time for a yearly physical   . Multiple Vitamin (MULTIVITAMIN WITH MINERALS) TABS tablet   Oral   Take 1 tablet by mouth daily.         . predniSONE (DELTASONE) 10 MG tablet      TAKE 1 TABLET BY MOUTH EVERY DAY... MAX OF 30 DAYS ON INSURANCE   100 tablet   0    BP 174/81  Pulse 83  Temp(Src) 98.4 F (36.9 C) (Oral)  Resp 20  SpO2 98% Physical Exam  Nursing note and vitals reviewed. Constitutional: He is oriented to person, place, and time. He appears well-developed and well-nourished. No distress.  HENT:  Head: Normocephalic and atraumatic.  Right Ear: External ear normal.  Left Ear: External ear normal.  Nose: Nose normal.  Eyes: Conjunctivae are normal.  Neck: Normal range of motion. No tracheal deviation present.  Cardiovascular: Normal rate, regular rhythm and normal heart sounds.   Pulmonary/Chest: Effort normal and breath sounds normal. No stridor.  Abdominal: Soft. He exhibits no distension. There is no tenderness.    Genitourinary: Testes normal and penis normal. Right testis shows no mass, no swelling and no tenderness. Right testis is descended. Cremasteric reflex is not absent on the right side. Left testis shows no mass, no swelling and no tenderness. Left testis is descended. Cremasteric reflex is not absent on the left side. Uncircumcised. No phimosis, paraphimosis, hypospadias, penile erythema or penile tenderness. No discharge found.  Musculoskeletal: Normal range of motion.  Neurological: He is alert and oriented to person, place, and time.  Skin: Skin is warm and dry. He is not diaphoretic.  Psychiatric: He has a normal mood and  affect. His behavior is normal.    ED Course  Procedures (including critical care time) Labs Review Labs Reviewed  CBC WITH DIFFERENTIAL - Abnormal; Notable for the following:    WBC 11.5 (*)    Neutrophils Relative % 80 (*)    Neutro Abs 9.3 (*)    Lymphocytes Relative 9 (*)    Monocytes Absolute 1.1 (*)    All other components within normal limits  COMPREHENSIVE  METABOLIC PANEL - Abnormal; Notable for the following:    Sodium 136 (*)    Creatinine, Ser 1.91 (*)    GFR calc non Af Amer 35 (*)    GFR calc Af Amer 41 (*)    All other components within normal limits  URINALYSIS, ROUTINE W REFLEX MICROSCOPIC - Abnormal; Notable for the following:    Hgb urine dipstick MODERATE (*)    Leukocytes, UA TRACE (*)    All other components within normal limits  URINE MICROSCOPIC-ADD ON - Abnormal; Notable for the following:    Bacteria, UA FEW (*)    All other components within normal limits  LIPASE, BLOOD   Imaging Review Ct Abdomen Pelvis Wo Contrast  05/01/2013   CLINICAL DATA:  Left flank pain.  EXAM: CT ABDOMEN AND PELVIS WITHOUT CONTRAST  TECHNIQUE: Multidetector CT imaging of the abdomen and pelvis was performed following the standard protocol without intravenous contrast.  COMPARISON:  01/23/2009  FINDINGS: Again noted is a partially calcified nodule at the left lung base that measures 2.4 cm and not significantly changed since 2007. There additional left pleural calcifications. No evidence for free air.  The liver is low-density with a heterogeneous appearance. Findings are most compatible with hepatic steatosis. There may be some sludge within the gallbladder but no gross abnormality. Normal appearance of the spleen, pancreas and both adrenal glands. Normal appearance of the right kidney with a punctate stone in the mid pole but no hydronephrosis. There is an 8 mm stone in the distal left ureter. There is dilatation of left ureter with peri-ureter stranding. Moderate fullness of the left  renal calices. No evidence for left kidney stones.  No significant free fluid or lymphadenopathy. Normal appearance of prostate cancer, seminal vesicles and urinary bladder. Normal appearance of the appendix. There is stool in the right colon. No gross abnormality to the small bowel. No acute bone abnormality. Mild degenerative changes in the lower lumbar spine.  IMPRESSION: Moderate left hydroureteronephrosis due to an 8 mm stone in the distal left ureter.  Punctate right kidney stone.   Electronically Signed   By: Markus Daft M.D.   On: 05/01/2013 20:08    EKG Interpretation   None      8:48 PM Discussed case with Dr. Carole Binning who recommends pain control and for patient to follow up in the office tomorrow.   MDM   1. Left ureteral calculus    Pt presents with 68mm stone in distal left ureter with hydroureteronephrosis. Creatinine is 1.91, WBC 11.5. Patient is afebrile. Pain is controlled in ED. Discussed case with urology who recommends he is seen at [facility] tomorrow. I discussed with patient to hold ASA tomorrow morning. I discussed reasons to return to the emergency room immediately. Patient voiced understanding. Vital signs stable for discharge. I discussed this case with Dr. Tamera Punt who agrees with plan. Patient / Family / Caregiver informed of clinical course, understand medical decision-making process, and agree with plan.     Elwyn Lade, PA-C 05/02/13 1749

## 2013-05-02 NOTE — ED Provider Notes (Signed)
Medical screening examination/treatment/procedure(s) were performed by non-physician practitioner and as supervising physician I was immediately available for consultation/collaboration.  EKG Interpretation   None         Malvin Johns, MD 05/02/13 2304

## 2013-05-11 ENCOUNTER — Other Ambulatory Visit: Payer: Self-pay | Admitting: Urology

## 2013-05-11 NOTE — Progress Notes (Signed)
Need orders in EPIC.  Surgery scheduled for 05/19/13.  preop 05/17/13 at 0800am.  Thank You.

## 2013-05-16 ENCOUNTER — Encounter (HOSPITAL_COMMUNITY): Payer: Self-pay | Admitting: Pharmacy Technician

## 2013-05-16 ENCOUNTER — Other Ambulatory Visit (HOSPITAL_COMMUNITY): Payer: Self-pay | Admitting: Urology

## 2013-05-16 NOTE — Progress Notes (Signed)
EKG 12/14 EPIC 

## 2013-05-16 NOTE — Patient Instructions (Signed)
[place]  05/16/2013   Your procedure is scheduled on:  05/19/13  FRIDAY  Report to [facility] at   0930    AM.  Call this number if you have problems the morning of surgery: [phone]       Remember:   Do not eat food  Or drink :After Midnight. Thursday NIGHT   Take these medicines the morning of surgery with A SIP OF WATER: Prednisone                                                   May take Percocet,  [facility]  if needed   .  Contacts, dentures or partial plates can not be worn to surgery  Leave suitcase in the car. After surgery it may be brought to your room.  For patients admitted to the hospital, checkout time is 11:00 AM day of  discharge.             SPECIAL INSTRUCTIONS- SEE [facility] PREPARING FOR SURGERY INSTRUCTION SHEET-     DO NOT WEAR JEWELRY, LOTIONS, POWDERS, OR PERFUMES.  WOMEN-- DO NOT SHAVE LEGS OR UNDERARMS FOR 12 HOURS BEFORE SHOWERS. MEN MAY SHAVE FACE.  Patients discharged the day of surgery will not be allowed to drive home. IF going home the day of surgery, you must have a driver and someone to stay with you for the first 24 hours  Name and phone number of your driver:                                                                             Emerly Prak  [facility] [place]                                                  Patient Signature _____________________________

## 2013-05-17 ENCOUNTER — Encounter (HOSPITAL_COMMUNITY): Admission: RE | Admit: 2013-05-17 | Payer: 59 | Source: Ambulatory Visit

## 2013-05-17 ENCOUNTER — Encounter: Payer: Self-pay | Admitting: Family

## 2013-05-17 ENCOUNTER — Ambulatory Visit (INDEPENDENT_AMBULATORY_CARE_PROVIDER_SITE_OTHER): Payer: 59 | Admitting: Family

## 2013-05-17 VITALS — BP 132/70 | HR 99 | Temp 100.5°F | Wt 252.0 lb

## 2013-05-17 DIAGNOSIS — R6889 Other general symptoms and signs: Secondary | ICD-10-CM

## 2013-05-17 DIAGNOSIS — R509 Fever, unspecified: Secondary | ICD-10-CM

## 2013-05-17 DIAGNOSIS — Z20828 Contact with and (suspected) exposure to other viral communicable diseases: Secondary | ICD-10-CM

## 2013-05-17 DIAGNOSIS — J111 Influenza due to unidentified influenza virus with other respiratory manifestations: Secondary | ICD-10-CM

## 2013-05-17 LAB — POCT INFLUENZA A/B
Influenza A, POC: POSITIVE
Influenza B, POC: POSITIVE

## 2013-05-17 MED ORDER — HYDROCOD POLST-CHLORPHEN POLST 10-8 MG/5ML PO LQCR: 5.0000 mL | Freq: Two times a day (BID) | ORAL | Status: DC | PRN

## 2013-05-17 MED ORDER — OSELTAMIVIR PHOSPHATE 75 MG PO CAPS
75.0000 mg | ORAL_CAPSULE | Freq: Two times a day (BID) | ORAL | Status: DC
Start: 2013-05-17 — End: 2014-01-10

## 2013-05-17 NOTE — Patient Instructions (Signed)

## 2013-05-17 NOTE — Progress Notes (Signed)
Pre visit review using our clinic review tool, if applicable. No additional management support is needed unless otherwise documented below in the visit note. 

## 2013-05-17 NOTE — Progress Notes (Signed)
Subjective:    Patient ID: [name], male    DOB: 12-01-1950, 63 y.o.   MRN: [id number]  HPI  64 y.o. White male presents today with "fever like symptoms". Pt father was sick with the flu last week as was his brother and sister. Pt starting having a productive cough with yellow mucus, fever, headache, fatigue and general body aches one day ago. Pt has still been able to drink and eat and has good capillary refill. Pt denies ear ache, throat ache, chest pain.     Review of Systems  Constitutional: Positive for fever, chills and fatigue.  HENT: Positive for congestion, nosebleeds and rhinorrhea.   Eyes: Positive for redness.  Respiratory: Positive for cough and chest tightness.   Cardiovascular: Negative.   Gastrointestinal: Negative.   Endocrine: Negative.   Genitourinary: Negative.   Musculoskeletal: Positive for myalgias.  Skin: Negative.   Allergic/Immunologic: Negative.   Neurological: Positive for light-headedness and headaches.  Hematological: Negative.   Psychiatric/Behavioral: Negative.        Past Medical History  Diagnosis Date  . Hyperlipidemia   . Allergy   . Anxiety   . Chronic kidney disease   . Asthma   . Eczema   . Arthritis   . Edema of foot     feet  . Wears dentures     bottom  . Wears glasses     driving  . HOH (hard of hearing)     History   Social History  . Marital Status: Married    Spouse Name: N/A    Number of Children: N/A  . Years of Education: N/A   Occupational History  . Not on file.   Social History Main Topics  . Smoking status: Never Smoker   . Smokeless tobacco: Not on file  . Alcohol Use: Yes     Comment: beer  . Drug Use: No  . Sexual Activity:    Other Topics Concern  . Not on file   Social History Narrative  . No narrative on file    Past Surgical History  Procedure Laterality Date  . Cervical fusion  2008    c6-7  . Nose surgery      fx as child  . Orif ulnar fracture      rt as child  .  Colonoscopy    . Finger fracture surgery      as child  . Lipoma excision  05/17/2012    Procedure: EXCISION LIPOMA;  Surgeon: Gayland Curry, MD,FACS;  Location: [place];  Service: General;  Laterality: Left;  incisional biopsy of left shoulder soft tissue mass    Family History  Problem Relation Age of Onset  . Arthritis Other   . Hypertension Other   . Osteoporosis Other   . Heart disease Other   . Lung disease Other     Allergies  Allergen Reactions  . Sesame Oil Itching  . Tobramycin     Used in an eye oint-reaction    Current Outpatient Prescriptions on File Prior to Visit  Medication Sig Dispense Refill  . aspirin 81 MG tablet Take 81 mg by mouth daily.        . Cyanocobalamin (VITAMIN B 12 PO) Take 1 tablet by mouth daily.      . hydrochlorothiazide (HYDRODIURIL) 25 MG tablet Take 25 mg by mouth every morning.      . Multiple Vitamin (MULTIVITAMIN WITH MINERALS) TABS tablet Take 1 tablet by mouth  daily.      . ondansetron (ZOFRAN) 4 MG tablet Take 1 tablet (4 mg total) by mouth every 6 (six) hours.  12 tablet  0  . oxyCODONE-acetaminophen (PERCOCET/ROXICET) 5-325 MG per tablet Take 1-2 tablets by mouth every 6 (six) hours as needed for severe pain.  20 tablet  0  . predniSONE (DELTASONE) 10 MG tablet Take 10 mg by mouth daily with breakfast.      . vitamin C (ASCORBIC ACID) 500 MG tablet Take 500 mg by mouth daily.       No current facility-administered medications on file prior to visit.    BP 132/70  Pulse 99  Temp(Src) 100.5 F (38.1 C) (Oral)  Wt 252 lb (114.306 kg)chart Objective:   Physical Exam  Constitutional: He is oriented to person, place, and time. He appears well-developed and well-nourished. He is active.  HENT:  Head: Normocephalic.  Right Ear: External ear normal.  Left Ear: External ear normal.  Nose: Rhinorrhea present. Epistaxis is observed.  Mouth/Throat: Uvula is midline and mucous membranes are normal. Posterior  oropharyngeal erythema present.  Eyes: Conjunctivae and lids are normal.  Cardiovascular: Normal rate, regular rhythm, normal heart sounds and normal pulses.   Pulmonary/Chest: Effort normal. He has rhonchi in the right upper field and the left upper field.  Abdominal: Soft. Bowel sounds are normal.  Neurological: He is alert and oriented to person, place, and time.  Skin: Skin is warm, dry and intact.  Psychiatric: He has a normal mood and affect. His speech is normal and behavior is normal.          Assessment & Plan:  63 y.o. White male that presents with chief complain of "flu like symptoms".  -Flu: Rapid flu swab--> positive for both influenza A and B  - Start TAMIFLU, 75mg , P.O BID   - Tylenol 650mg , p.o. Q8 hours for fever or pain   - Tussionex 65ml p.o BID  - Education: Drink at least 8 oz of water per day.   - Add Gatorade if not able to eat to help replenish electrolytes and hydration.  -Follow up: as needed if symptoms do not resolve in 5 days.

## 2013-05-17 NOTE — Progress Notes (Signed)
Presented to PST for pre op visit. States started getting sick yesterday- entire family has had this as well. States poductive cough- no color yet, throat is scratchy and head is full. States fever to 100.  I advised this patient to go see PCP this AM and to also call Chastity at [facility] to notify her.  Left message with Chastity about this at 0805am.  Informed her and patient that IF surgery is done Friday we will give him instructions over the phone

## 2013-05-18 ENCOUNTER — Encounter (HOSPITAL_BASED_OUTPATIENT_CLINIC_OR_DEPARTMENT_OTHER): Payer: Self-pay | Admitting: *Deleted

## 2013-05-19 ENCOUNTER — Encounter (HOSPITAL_COMMUNITY): Admission: RE | Payer: Self-pay | Source: Ambulatory Visit

## 2013-05-19 ENCOUNTER — Other Ambulatory Visit: Payer: Self-pay | Admitting: Urology

## 2013-05-19 ENCOUNTER — Encounter (HOSPITAL_BASED_OUTPATIENT_CLINIC_OR_DEPARTMENT_OTHER): Payer: Self-pay | Admitting: *Deleted

## 2013-05-19 ENCOUNTER — Ambulatory Visit (HOSPITAL_COMMUNITY): Admission: RE | Admit: 2013-05-19 | Payer: 59 | Source: Ambulatory Visit | Admitting: Urology

## 2013-05-19 SURGERY — CYSTOSCOPY WITH URETEROSCOPY
Anesthesia: General | Laterality: Left

## 2013-05-19 NOTE — Progress Notes (Signed)
REVIEWED PT CHART IN EPIC . NOTED PT DX INFLUENZA A AND B AT HIS PCO HE WAS GIVEN TAMIFLU AND PT ALSO HAS ASTHMA IN HX.  SPOKE DR GERMEROTH MDA FOR TODAY FOR ADVISE ABOUT ALONG SHOULD PT BE SYMPTOM FREE BEFORE PROCEDURE.  DR Lissa Hoard  STATED PT SHOULD BE AT LEAST A WEEK AFTER SYMPTOM FREE , STATED TO CALL PT ON Wednesday 05-24-2013 AND INQUIRE ABOUT SYMPTOMS.

## 2013-05-23 ENCOUNTER — Encounter (HOSPITAL_BASED_OUTPATIENT_CLINIC_OR_DEPARTMENT_OTHER): Payer: Self-pay | Admitting: *Deleted

## 2013-05-24 ENCOUNTER — Encounter (HOSPITAL_BASED_OUTPATIENT_CLINIC_OR_DEPARTMENT_OTHER): Payer: Self-pay | Admitting: *Deleted

## 2013-05-24 NOTE — Progress Notes (Signed)
To [facility] at 0900-Istat 8 on arrival-Ekg in epic-instructed Npo after mn-take prednisone with small amt water only.Tx for flu completed x 7days-no symptoms of fever,congestion,cough(other than rare slight tickle type cough )

## 2013-05-24 NOTE — Progress Notes (Signed)
05/24/13 0924  OBSTRUCTIVE SLEEP APNEA  Have you ever been diagnosed with sleep apnea through a sleep study? No  Do you snore loudly (loud enough to be heard through closed doors)?  1  Do you often feel tired, fatigued, or sleepy during the daytime? 0  Has anyone observed you stop breathing during your sleep? 0  Do you have, or are you being treated for high blood pressure? 0  BMI more than 35 kg/m2? 1  Age over 65 years old? 1  Gender: 1  Obstructive Sleep Apnea Score 4   

## 2013-05-24 NOTE — Progress Notes (Signed)
05/24/13 0924  OBSTRUCTIVE SLEEP APNEA  Have you ever been diagnosed with sleep apnea through a sleep study? No  Do you snore loudly (loud enough to be heard through closed doors)?  1  Do you often feel tired, fatigued, or sleepy during the daytime? 0  Has anyone observed you stop breathing during your sleep? 0  Do you have, or are you being treated for high blood pressure? 0  BMI more than 35 kg/m2? 1  Age over 65 years old? 1  Gender: 1  Obstructive Sleep Apnea Score 4

## 2013-05-25 ENCOUNTER — Encounter (HOSPITAL_BASED_OUTPATIENT_CLINIC_OR_DEPARTMENT_OTHER): Payer: Self-pay | Admitting: *Deleted

## 2013-05-26 ENCOUNTER — Encounter (HOSPITAL_BASED_OUTPATIENT_CLINIC_OR_DEPARTMENT_OTHER): Payer: Self-pay | Admitting: *Deleted

## 2013-05-26 ENCOUNTER — Ambulatory Visit (HOSPITAL_BASED_OUTPATIENT_CLINIC_OR_DEPARTMENT_OTHER): Payer: 59 | Admitting: Anesthesiology

## 2013-05-26 ENCOUNTER — Encounter (HOSPITAL_BASED_OUTPATIENT_CLINIC_OR_DEPARTMENT_OTHER)
Admission: RE | Disposition: A | Discharge status: Home / Self Care | Payer: Self-pay | Source: Ambulatory Visit | Attending: Urology

## 2013-05-26 ENCOUNTER — Encounter (HOSPITAL_BASED_OUTPATIENT_CLINIC_OR_DEPARTMENT_OTHER): Payer: 59 | Admitting: Anesthesiology

## 2013-05-26 ENCOUNTER — Ambulatory Visit (HOSPITAL_BASED_OUTPATIENT_CLINIC_OR_DEPARTMENT_OTHER)
Admission: RE | Admit: 2013-05-26 | Discharge: 2013-05-26 | Disposition: A | Discharge status: Home / Self Care | Payer: 59 | Source: Ambulatory Visit | Attending: Urology | Admitting: Urology

## 2013-05-26 DIAGNOSIS — N201 Calculus of ureter: Secondary | ICD-10-CM

## 2013-05-26 DIAGNOSIS — E291 Testicular hypofunction: Secondary | ICD-10-CM | POA: Insufficient documentation

## 2013-05-26 DIAGNOSIS — Z79899 Other long term (current) drug therapy: Secondary | ICD-10-CM | POA: Insufficient documentation

## 2013-05-26 DIAGNOSIS — N35919 Unspecified urethral stricture, male, unspecified site: Secondary | ICD-10-CM | POA: Insufficient documentation

## 2013-05-26 DIAGNOSIS — J45909 Unspecified asthma, uncomplicated: Secondary | ICD-10-CM | POA: Insufficient documentation

## 2013-05-26 HISTORY — DX: Calculus of ureter: N20.1

## 2013-05-26 HISTORY — DX: Other specified personal risk factors, not elsewhere classified: Z91.89

## 2013-05-26 HISTORY — DX: Influenza due to other identified influenza virus with other respiratory manifestations: J10.1

## 2013-05-26 HISTORY — PX: HOLMIUM LASER APPLICATION: SHX5852

## 2013-05-26 HISTORY — DX: Personal history of other diseases of the digestive system: Z87.19

## 2013-05-26 HISTORY — PX: CYSTOSCOPY WITH URETEROSCOPY: SHX5123

## 2013-05-26 LAB — POCT I-STAT, CHEM 8
BUN: 20 mg/dL (ref 6–23)
CALCIUM ION: 1.02 mmol/L — AB (ref 1.13–1.30)
CHLORIDE: 105 meq/L (ref 96–112)
Creatinine, Ser: 1 mg/dL (ref 0.50–1.35)
Glucose, Bld: 115 mg/dL — ABNORMAL HIGH (ref 70–99)
HEMATOCRIT: 45 % (ref 39.0–52.0)
HEMOGLOBIN: 15.3 g/dL (ref 13.0–17.0)
Potassium: 4.1 mEq/L (ref 3.7–5.3)
Sodium: 139 mEq/L (ref 137–147)
TCO2: 24 mmol/L (ref 0–100)

## 2013-05-26 SURGERY — CYSTOSCOPY WITH URETEROSCOPY
Anesthesia: General | Site: Ureter | Laterality: Left

## 2013-05-26 MED ORDER — MEPERIDINE HCL 25 MG/ML IJ SOLN
6.2500 mg | INTRAMUSCULAR | Status: DC | PRN
  Filled 2013-05-26: qty 1

## 2013-05-26 MED ORDER — URELLE 81 MG PO TABS
ORAL_TABLET | ORAL | Status: AC
  Filled 2013-05-26: qty 1

## 2013-05-26 MED ORDER — DEXAMETHASONE SODIUM PHOSPHATE 4 MG/ML IJ SOLN
INTRAMUSCULAR | Status: DC | PRN
  Administered 2013-05-26: 4 mg via INTRAVENOUS

## 2013-05-26 MED ORDER — OXYCODONE HCL 5 MG PO TABS
ORAL_TABLET | ORAL | Status: AC
  Filled 2013-05-26: qty 1

## 2013-05-26 MED ORDER — CIPROFLOXACIN HCL 500 MG PO TABS: 500.0000 mg | ORAL_TABLET | Freq: Two times a day (BID) | ORAL | Status: DC

## 2013-05-26 MED ORDER — TAMSULOSIN HCL 0.4 MG PO CAPS: 0.4000 mg | ORAL_CAPSULE | Freq: Every day | ORAL | Status: DC

## 2013-05-26 MED ORDER — CEFAZOLIN SODIUM-DEXTROSE 2-3 GM-% IV SOLR
INTRAVENOUS | Status: DC | PRN
  Administered 2013-05-26: 2 g via INTRAVENOUS

## 2013-05-26 MED ORDER — ACETAMINOPHEN 10 MG/ML IV SOLN
INTRAVENOUS | Status: DC | PRN
  Administered 2013-05-26: 1000 mg via INTRAVENOUS

## 2013-05-26 MED ORDER — SODIUM CHLORIDE 0.9 % IR SOLN
Status: DC | PRN
  Administered 2013-05-26: 4000 mL

## 2013-05-26 MED ORDER — OXYCODONE HCL 5 MG PO TABS
5.0000 mg | ORAL_TABLET | Freq: Four times a day (QID) | ORAL | Status: DC | PRN
  Administered 2013-05-26: 5 mg via ORAL
  Filled 2013-05-26: qty 1

## 2013-05-26 MED ORDER — BELLADONNA ALKALOIDS-OPIUM 16.2-60 MG RE SUPP
RECTAL | Status: AC
  Filled 2013-05-26: qty 1

## 2013-05-26 MED ORDER — KETOROLAC TROMETHAMINE 30 MG/ML IJ SOLN
INTRAMUSCULAR | Status: DC | PRN
  Administered 2013-05-26: 30 mg via INTRAVENOUS

## 2013-05-26 MED ORDER — CEFAZOLIN SODIUM-DEXTROSE 2-3 GM-% IV SOLR
2.0000 g | INTRAVENOUS | Status: DC
Start: 2013-05-26 — End: 2013-05-26
  Filled 2013-05-26: qty 50

## 2013-05-26 MED ORDER — ONDANSETRON HCL 4 MG/2ML IJ SOLN
INTRAMUSCULAR | Status: DC | PRN
  Administered 2013-05-26: 4 mg via INTRAVENOUS

## 2013-05-26 MED ORDER — PROMETHAZINE HCL 25 MG/ML IJ SOLN
6.2500 mg | INTRAMUSCULAR | Status: DC | PRN
  Filled 2013-05-26: qty 1

## 2013-05-26 MED ORDER — MIDAZOLAM HCL 2 MG/2ML IJ SOLN
INTRAMUSCULAR | Status: AC
  Filled 2013-05-26: qty 2

## 2013-05-26 MED ORDER — PROPOFOL 10 MG/ML IV BOLUS
INTRAVENOUS | Status: DC | PRN
  Administered 2013-05-26: 250 mg via INTRAVENOUS
  Administered 2013-05-26: 50 mg via INTRAVENOUS

## 2013-05-26 MED ORDER — URELLE 81 MG PO TABS: 1.0000 | ORAL_TABLET | Freq: Three times a day (TID) | ORAL | Status: DC

## 2013-05-26 MED ORDER — MIDAZOLAM HCL 5 MG/5ML IJ SOLN
INTRAMUSCULAR | Status: DC | PRN
  Administered 2013-05-26 (×2): 1 mg via INTRAVENOUS

## 2013-05-26 MED ORDER — LIDOCAINE HCL (CARDIAC) 20 MG/ML IV SOLN
INTRAVENOUS | Status: DC | PRN
  Administered 2013-05-26: 100 mg via INTRAVENOUS

## 2013-05-26 MED ORDER — OXYCODONE-ACETAMINOPHEN 5-325 MG PO TABS: 1.0000 | ORAL_TABLET | Freq: Four times a day (QID) | ORAL | Status: DC | PRN

## 2013-05-26 MED ORDER — LACTATED RINGERS IV SOLN
INTRAVENOUS | Status: DC | PRN
  Administered 2013-05-26 (×2): via INTRAVENOUS

## 2013-05-26 MED ORDER — LIDOCAINE HCL 2 % EX GEL
CUTANEOUS | Status: DC | PRN
  Administered 2013-05-26: 1

## 2013-05-26 MED ORDER — BELLADONNA ALKALOIDS-OPIUM 16.2-60 MG RE SUPP
RECTAL | Status: DC | PRN
  Administered 2013-05-26: 1 via RECTAL

## 2013-05-26 MED ORDER — LACTATED RINGERS IV SOLN
INTRAVENOUS | Status: DC
  Administered 2013-05-26: 09:00:00 via INTRAVENOUS
  Filled 2013-05-26: qty 1000

## 2013-05-26 MED ORDER — URELLE 81 MG PO TABS
1.0000 | ORAL_TABLET | Freq: Four times a day (QID) | ORAL | Status: DC
  Administered 2013-05-26: 81 mg via ORAL
  Filled 2013-05-26: qty 1

## 2013-05-26 MED ORDER — LACTATED RINGERS IV SOLN
INTRAVENOUS | Status: DC
  Filled 2013-05-26: qty 1000

## 2013-05-26 MED ORDER — FENTANYL CITRATE 0.05 MG/ML IJ SOLN
INTRAMUSCULAR | Status: AC
  Filled 2013-05-26: qty 6

## 2013-05-26 MED ORDER — FENTANYL CITRATE 0.05 MG/ML IJ SOLN
INTRAMUSCULAR | Status: DC | PRN
  Administered 2013-05-26: 25 ug via INTRAVENOUS
  Administered 2013-05-26: 50 ug via INTRAVENOUS
  Administered 2013-05-26 (×5): 25 ug via INTRAVENOUS
  Administered 2013-05-26: 50 ug via INTRAVENOUS
  Administered 2013-05-26 (×2): 25 ug via INTRAVENOUS

## 2013-05-26 MED ORDER — FENTANYL CITRATE 0.05 MG/ML IJ SOLN
25.0000 ug | INTRAMUSCULAR | Status: DC | PRN
Start: 2013-05-26 — End: 2013-05-26
  Filled 2013-05-26: qty 1

## 2013-05-26 SURGICAL SUPPLY — 39 items
ADAPTER CATH URET PLST 4-6FR (CATHETERS) IMPLANT
BAG DRAIN URO-CYSTO SKYTR STRL (DRAIN) ×2 IMPLANT
BASKET LASER NITINOL 1.9FR (BASKET) IMPLANT
BASKET STNLS GEMINI 4WIRE 3FR (BASKET) IMPLANT
BASKET ZERO TIP NITINOL 2.4FR (BASKET) ×2 IMPLANT
BOOTIES KNEE HIGH SLOAN (MISCELLANEOUS) ×2 IMPLANT
CANISTER SUCT LVC 12 LTR MEDI- (MISCELLANEOUS) ×2 IMPLANT
CATH CLEAR GEL 3F BACKSTOP (CATHETERS) ×2 IMPLANT
CATH INTERMIT  6FR 70CM (CATHETERS) ×2 IMPLANT
CATH URET 5FR 28IN CONE TIP (BALLOONS)
CATH URET 5FR 28IN OPEN ENDED (CATHETERS) IMPLANT
CATH URET 5FR 70CM CONE TIP (BALLOONS) IMPLANT
CATH URET DUAL LUMEN 6-10FR 50 (CATHETERS) IMPLANT
CLOTH BEACON ORANGE TIMEOUT ST (SAFETY) ×2 IMPLANT
DRAPE CAMERA CLOSED 9X96 (DRAPES) ×2 IMPLANT
FIBER LASER FLEXIVA 1000 (UROLOGICAL SUPPLIES) IMPLANT
FIBER LASER FLEXIVA 200 (UROLOGICAL SUPPLIES) IMPLANT
FIBER LASER FLEXIVA 365 (UROLOGICAL SUPPLIES) ×2 IMPLANT
FIBER LASER FLEXIVA 550 (UROLOGICAL SUPPLIES) IMPLANT
GLOVE BIO SURGEON STRL SZ7.5 (GLOVE) ×2 IMPLANT
GOWN STRL REIN XL XLG (GOWN DISPOSABLE) ×2 IMPLANT
GOWN STRL REUS W/ TWL LRG LVL3 (GOWN DISPOSABLE) ×1 IMPLANT
GOWN STRL REUS W/TWL LRG LVL3 (GOWN DISPOSABLE) ×1
GOWN STRL REUS W/TWL XL LVL3 (GOWN DISPOSABLE) ×2 IMPLANT
GUIDEWIRE 0.038 PTFE COATED (WIRE) IMPLANT
GUIDEWIRE ANG ZIPWIRE 038X150 (WIRE) IMPLANT
GUIDEWIRE STR DUAL SENSOR (WIRE) ×2 IMPLANT
IV NS 1000ML (IV SOLUTION) ×4
IV NS 1000ML BAXH (IV SOLUTION) ×4 IMPLANT
IV NS IRRIG 3000ML ARTHROMATIC (IV SOLUTION) ×2 IMPLANT
KIT BALLIN UROMAX 15FX10 (LABEL) IMPLANT
KIT BALLN UROMAX 15FX4 (MISCELLANEOUS) IMPLANT
KIT BALLN UROMAX 26 75X4 (MISCELLANEOUS)
PACK CYSTOSCOPY (CUSTOM PROCEDURE TRAY) ×2 IMPLANT
SET HIGH PRES BAL DIL (LABEL)
SHEATH ACCESS URETERAL 38CM (SHEATH) IMPLANT
SHEATH ACCESS URETERAL 54CM (SHEATH) IMPLANT
STENT CONTOUR 6FRX24X.038 (STENTS) ×2 IMPLANT
WATER STERILE IRR 500ML POUR (IV SOLUTION) IMPLANT

## 2013-05-26 NOTE — Anesthesia Postprocedure Evaluation (Signed)
  Anesthesia Post-op Note  Patient: [name]  Procedure(s) Performed: Procedure(s) (LRB): CYSTOSCOPY WITH URETEROSCOPY, URETHERAL DILATATION,LEFT RETROGRADE, LASER LITHOTRIPSY, STENT PLACMENT, LEFT URETEROSCOPY (Left) HOLMIUM LASER APPLICATION (Left)  Patient Location: PACU  Anesthesia Type: General  Level of Consciousness: awake and alert   Airway and Oxygen Therapy: Patient Spontanous Breathing  Post-op Pain: mild  Post-op Assessment: Post-op Vital signs reviewed, Patient's Cardiovascular Status Stable, Respiratory Function Stable, Patent Airway and No signs of Nausea or vomiting  Last Vitals:  Filed Vitals:   05/26/13 1215  BP: 115/71  Pulse: 77  Temp:   Resp: 20    Post-op Vital Signs: stable   Complications: No apparent anesthesia complications

## 2013-05-26 NOTE — Anesthesia Procedure Notes (Signed)
Procedure Name: LMA Insertion Date/Time: 05/26/2013 10:39 AM Performed by: Justice Rocher Pre-anesthesia Checklist: Patient identified, Emergency Drugs available, Suction available and Patient being monitored Patient Re-evaluated:Patient Re-evaluated prior to inductionOxygen Delivery Method: Circle System Utilized Preoxygenation: Pre-oxygenation with 100% oxygen Intubation Type: IV induction Ventilation: Mask ventilation without difficulty LMA: LMA inserted LMA Size: 5.0 Number of attempts: 1 Airway Equipment and Method: bite block Placement Confirmation: positive ETCO2 Tube secured with: Tape Dental Injury: Teeth and Oropharynx as per pre-operative assessment

## 2013-05-26 NOTE — Interval H&P Note (Signed)
History and Physical Interval Note:  05/26/2013 11:35 AM  [name]  has presented today for surgery, with the diagnosis of Left Lower Ureteral Stone  The various methods of treatment have been discussed with the patient and family. After consideration of risks, benefits and other options for treatment, the patient has consented to  Procedure(s): CYSTOSCOPY WITH URETEROSCOPY, URETHERAL DILATATION,LEFT RETROGRADE, LASER LITHOTRIPSY, STENT PLACMENT, LEFT URETEROSCOPY (Left) HOLMIUM LASER APPLICATION (Left) as a surgical intervention .  The patient's history has been reviewed, patient examined, no change in status, stable for surgery.  I have reviewed the patient's chart and labs.  Questions were answered to the patient's satisfaction.     Carolan Clines I

## 2013-05-26 NOTE — Op Note (Signed)
Pre-operative diagnosis : Impacted 8 mm left lower ureteral calculus  Postoperative diagnosis: Same , plus sub-meatal stenosis requiring dilation to 28 [place]   Operation: Urethral sub-meatal dilation from a size 82 [place] to 28 [place],  cystourethroscopy, left retrograde Pyelogram with interpretation, implantation of Backstop, left ureteroscopy, laser lithotripsy of impacted 53mm left lower ureteral calculus, basket extraction of fragments, insertion of left double-J stent (6 [place] by 24 cm).   Surgeon:  Chauncey Cruel. Gaynelle Arabian, MD  First assistant:  None  Anesthesia:  General LMA   Preparation:  After appropriate preanesthesia, the patient was brought to the operating room, placed on the operating table in the dorsal supine position where general LMA anesthesia was introduced. He was replaced in the dorsal lithotomy position with pubis was prepped with Betadine solution and draped in usual fashion. The armband was double checked. The history was double checked. KUB was obtained and double checked.   Review history:  Problems  1. Calculus of left ureter (592.1)   Assessed By: Carolan Clines (Urology); Last Assessed: 09 May 2013  2. 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 He has not been able to pass the stone. He continues to have intermittent pain.  Originally referred by Dr. Elease Hashimoto for further evaluation of hypogonadism & he was seen in the ER yesterday (05/01/13) for Lt flank pain that started 4 days prior. He has had a previous hx of kidney stones. CT shows moderate Lt hydronephrosis due to an 51mm distal Lt ureteral stone.      Statement of  Likelihood of Success: Excellent. TIME-OUT observed.:  Procedure: Cystourethroscopy could not be accomplished because of sub-meatal stenosis. Therefore, the urethra was dilated from a 105 [place] to a 18 [place] with the [employer].  Following this, cystoscopy was accomplished, and left retrograde pyelogram was  performed, which showed the 37mm  stone in the left lower ureter. A 0.038 guidewire was passed around the stone, up to the renal pelvis, and dilation of the left lower ureter was noted around the stone was noted.  Ureteroscopy was then accomplished through the left ureteral orifice, and the left ureteral stone was identified. The stone itself was multifaceted, and appeared to be 3 stones all grown together. A backstop catheter was then maneuvered around the stone, next to the guidewire, and Backstop was placed above the stone.  The Backstop catheter was removed, and the to 400  laser fiber was placed in the left lower ureter. The stone was pulverized using the laser at settings of 5/15.. Fragments were extracted into the bladder. Following extraction of all fragments, a 6 [place] by 24 cm double-J stent was passed into the renal pelvis, and into the bladder under fluoroscopic control. The fragments in the bladder were irrigated free from the bladder, and also basket extracted from the bladder. These were taken with the surgeon for examination by an outside facility.  The bladder was drained of fluid, and the urethra was anesthetized with Xylocaine jelly. The patient received IV Tylenol, IV Toradol, and a B. and O. suppository. He was awakened and taken to recovery room in good condition.

## 2013-05-26 NOTE — Anesthesia Preprocedure Evaluation (Signed)
Anesthesia Evaluation  Patient identified by MRN, date of birth, ID band Patient awake    Reviewed: Allergy & Precautions, H&P , NPO status , Patient's Chart, lab work & pertinent test results  Airway Mallampati: II TM Distance: >3 FB Neck ROM: Full    Dental  (+) Teeth Intact, Dental Advisory Given and Lower Dentures   Pulmonary asthma ,  breath sounds clear to auscultation        Cardiovascular negative cardio ROS  Rhythm:Regular Rate:Normal     Neuro/Psych negative neurological ROS  negative psych ROS   GI/Hepatic negative GI ROS, Neg liver ROS, hiatal hernia,   Endo/Other  Morbid obesity  Renal/GU Renal disease  negative genitourinary   Musculoskeletal negative musculoskeletal ROS (+)   Abdominal   Peds negative pediatric ROS (+)  Hematology negative hematology ROS (+)   Anesthesia Other Findings   Reproductive/Obstetrics negative OB ROS                           Anesthesia Physical  Anesthesia Plan  ASA: II  Anesthesia Plan: General   Post-op Pain Management:    Induction: Intravenous  Airway Management Planned: LMA  Additional Equipment:   Intra-op Plan:   Post-operative Plan: Extubation in OR  Informed Consent: I have reviewed the patients History and Physical, chart, labs and discussed the procedure including the risks, benefits and alternatives for the proposed anesthesia with the patient or authorized representative who has indicated his/her understanding and acceptance.   Dental advisory given  Plan Discussed with: CRNA, Anesthesiologist and Surgeon  Anesthesia Plan Comments:         Anesthesia Quick Evaluation

## 2013-05-26 NOTE — Discharge Instructions (Addendum)
Kidney Stones Kidney stones (urolithiasis) are solid masses that form inside your kidneys. The intense pain is caused by the stone moving through the kidney, ureter, bladder, and urethra (urinary tract). When the stone moves, the ureter starts to spasm around the stone. The stone is usually passed in your pee (urine).  HOME CARE  Drink enough fluids to keep your pee clear or pale yellow. This helps to get the stone out.  Strain all pee through the provided strainer. Do not pee without peeing through the strainer, not even once. If you pee the stone out, catch it in the strainer. The stone may be as small as a grain of salt. Take this to your doctor. This will help your doctor figure out what you can do to try to prevent more kidney stones.  Only take medicine as told by your doctor.  Follow up with your doctor as told.  Get follow-up X-rays as told by your doctor. GET HELP IF: You have pain that gets worse even if you have been taking pain medicine. GET HELP RIGHT AWAY IF:   Your pain does not get better with medicine.  You have a fever or shaking chills.  Your pain increases and gets worse over 18 hours.  You have new belly (abdominal) pain.  You feel faint or pass out.  You are unable to pee. MAKE SURE YOU:   Understand these instructions.  Will watch your condition.  Will get help right away if you are not doing well or get worse. Document Released: 09/30/2007 Document Revised: 12/14/2012 Document Reviewed: 09/14/2012 [facility] Patient Information 2014 [place], [place].  Ureteral Colic (Kidney Stones) Ureteral colic is the result of a condition when kidney stones form inside the kidney. Once kidney stones are formed they may move into the tube that connects the kidney with the bladder (ureter). If this occurs, this condition may cause pain (colic) in the ureter.  CAUSES  Pain is caused by stone movement in the ureter and the obstruction caused by the stone. SYMPTOMS  The  pain comes and goes as the ureter contracts around the stone. The pain is usually intense, sharp, and stabbing in character. The location of the pain may move as the stone moves through the ureter. When the stone is near the kidney the pain is usually located in the back and radiates to the belly (abdomen). When the stone is ready to pass into the bladder the pain is often located in the lower abdomen on the side the stone is located. At this location, the symptoms may mimic those of a urinary tract infection with urinary frequency. Once the stone is located here it often passes into the bladder and the pain disappears completely. TREATMENT   Your caregiver will provide you with medicine for pain relief.  You may require specialized follow-up X-rays.  The absence of pain does not always mean that the stone has passed. It may have just stopped moving. If the urine remains completely obstructed, it can cause loss of kidney function or even complete destruction of the involved kidney. It is your responsibility and in your interest that X-rays and follow-ups as suggested by your caregiver are completed. Relief of pain without passage of the stone can be associated with severe damage to the kidney, including loss of kidney function on that side.  If your stone does not pass on its own, additional measures may be taken by your caregiver to ensure its removal. HOME CARE INSTRUCTIONS   Increase your  fluid intake. Water is the preferred fluid since juices containing vitamin C may acidify the urine making it less likely for certain stones (uric acid stones) to pass.  Strain all urine. A strainer will be provided. Keep all particulate matter or stones for your caregiver to inspect.  Take your pain medicine as directed.  Make a follow-up appointment with your caregiver as directed.  Remember that the goal is passage of your stone. The absence of pain does not mean the stone is gone. Follow your caregiver's  instructions.  Only take over-the-counter or prescription medicines for pain, discomfort, or fever as directed by your caregiver. SEEK MEDICAL CARE IF:   Pain cannot be controlled with the prescribed medicine.  You have a fever.  Pain continues for longer than your caregiver advises it should.  There is a change in the pain, and you develop chest discomfort or constant abdominal pain.  You feel faint or pass out. MAKE SURE YOU:   Understand these instructions.  Will watch your condition.  Will get help right away if you are not doing well or get worse. Document Released: 01/21/2005 Document Revised: 08/08/2012 Document Reviewed: 10/08/2010 [facility] Patient Information 2014 [place], [place].  Post Anesthesia Home Care Instructions  Activity: Get plenty of rest for the remainder of the day. A responsible adult should stay with you for 24 hours following the procedure.  For the next 24 hours, DO NOT: -Drive a car -Paediatric nurse -Drink alcoholic beverages -Take any medication unless instructed by your physician -Make any legal decisions or sign important papers.  Meals: Start with liquid foods such as gelatin or soup. Progress to regular foods as tolerated. Avoid greasy, spicy, heavy foods. If nausea and/or vomiting occur, drink only clear liquids until the nausea and/or vomiting subsides. Call your physician if vomiting continues.  Special Instructions/Symptoms: Your throat may feel dry or sore from the anesthesia or the breathing tube placed in your throat during surgery. If this causes discomfort, gargle with warm salt water. The discomfort should disappear within 24 hours. [facility] [phone] Post Ureteroscopy With or Without Stent Instructions  Definitions:  Ureter: The duct that transports urine from the kidney to the bladder. Stent:   A plastic hollow tube that is placed into the ureter, from the kidney to the                 bladder to  prevent the ureter from swelling shut.  GENERAL INSTRUCTIONS:  Despite the fact that no skin incisions were used, the area around the ureter and bladder is raw and irritated. The stent is a foreign body which will further irritate the bladder wall. This irritation is manifested by increased frequency of urination, both day and night, and by an increase in the urge to urinate. In some, the urge to urinate is present almost always. Sometimes the urge is strong enough that you may not be able to stop yourself from urinating. The only real cure is to remove the stent and then give time for the bladder wall to heal which can't be done until the danger of the ureter swelling shut has passed, which varies.  You may see some blood in your urine while the stent is in place and a few days afterwards. Do not be alarmed, even if the urine was clear for a while. Get off your feet and drink lots of fluids until clearing occurs. If you start to pass clots or don't improve, call us.  DIET: You may  return to your normal diet immediately. Because of the raw surface of your bladder, alcohol, spicy foods, acid type foods and drinks with caffeine may cause irritation or frequency and should be used in moderation. To keep your urine flowing freely and to avoid constipation, drink plenty of fluids during the day ( 8-10 glasses ). Tip: Avoid cranberry juice because it is very acidic.  ACTIVITY: Your physical activity doesn't need to be restricted. However, if you are very active, you may see some blood in your urine. We suggest that you reduce your activity under these circumstances until the bleeding has stopped.  BOWELS: It is important to keep your bowels regular during the postoperative period. Straining with bowel movements can cause bleeding. A bowel movement every other day is reasonable. Use a mild laxative if needed, such as Milk of Magnesia 2-3 tablespoons, or 2 Dulcolax tablets. Call if you continue to have  problems. If you have been taking narcotics for pain, before, during or after your surgery, you may be constipated. Take a laxative if necessary.   MEDICATION: You should resume your pre-surgery medications unless told not to. In addition you will often be given an antibiotic to prevent infection. These should be taken as prescribed until the bottles are finished unless you are having an unusual reaction to one of the drugs.  PROBLEMS YOU SHOULD REPORT TO [place]:  Fevers over 100.5 Fahrenheit.  Heavy bleeding, or clots ( See above notes about blood in urine ).  Inability to urinate.  Drug reactions ( hives, rash, nausea, vomiting, diarrhea ).  Severe burning or pain with urination that is not improving.  FOLLOW-UP: You will need a follow-up appointment to monitor your progress. Call for this appointment at the number listed above. Usually the first appointment will be about three to fourteen days after your surgery.

## 2013-05-26 NOTE — H&P (Signed)
eason For Visit 1 week f/u   Active Problems Problems  1. Calculus of left ureter (592.1)   Assessed By: Carolan Clines (Urology); Last Assessed: 09 May 2013 2. 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 He has not been able to pass the stone. He continues to have intermittent pain.     Originally referred by Dr. Elease Hashimoto for further evaluation of hypogonadism & he was seen in the ER yesterday (05/01/13) for Lt flank pain that started 4 days prior. He has had a previous hx of kidney stones. CT shows moderate Lt hydronephrosis due to an 20mm distal Lt ureteral stone.    03/29/13 labs: Testosterone - 260.19 and PSA - 0.19   Past Medical History Problems  1. History of asthma (V12.69)  Surgical History Problems  1. History of Back Surgery  Current Meds 1. Albuterol Sulfate (2.5 MG/3ML) 0.083% Inhalation Nebulization Solution;  Therapy: (BWGYKZLD:35TSV7793) to Recorded 2. Aspirin 81 MG Oral Tablet;  Therapy: (JQZESPQZ:30QTM2263) to Recorded 3. Flonase 50 MCG/ACT Nasal Suspension;  Therapy: (FHLKTGYB:63SLH7342) to Recorded 4. Hydrochlorothiazide 25 MG Oral Tablet;  Therapy: (AJGOTLXB:26OMB5597) to Recorded 5. PredniSONE 10 MG Oral Tablet;  Therapy: (CBULAGTX:64WOE3212) to Recorded  Allergies Medication  1. Sesame Oil OIL 2. tobramycin  Family History Problems  1. Family history of chronic obstructive pulmonary disease (V17.6) : Mother 2. Family history of kidney stones (V18.69) : Father 3. Family history of prostate cancer (Y48.25) : Father  Social History Problems  1. Alcohol use   2 2. Caffeine use (V49.89)   4 3. Married 4. Never a smoker (V49.89) 5. Occupation   Press photographer  Review of Systems Genitourinary, constitutional, skin, eye, otolaryngeal, hematologic/lymphatic, cardiovascular, pulmonary, endocrine, musculoskeletal, gastrointestinal,  neurological and psychiatric system(s) were reviewed and pertinent findings if present are noted.  Genitourinary: nocturia, urinary stream starts and stops, hematuria and erectile dysfunction, but no urinary frequency, no feelings of urinary urgency, urine stream is not weak, no incomplete emptying of bladder and initiating urination does not require straining.  Gastrointestinal: nausea, vomiting, heartburn and constipation, but no diarrhea.  Constitutional: night sweats and feeling tired (fatigue), but no fever and no recent weight loss.  Integumentary: no new skin rashes or lesions and no pruritus.  Eyes: no blurred vision and no diplopia.  ENT: sinus problems, but no sore throat.  Hematologic/Lymphatic: no tendency to easily bruise and no swollen glands.  Cardiovascular: no chest pain and no leg swelling.  Respiratory: no shortness of breath and no cough.  Endocrine: no polydipsia.  Musculoskeletal: back pain and joint pain.  Neurological: headache and dizziness.  Psychiatric: no anxiety and no depression.    Vitals Vital Signs [Data Includes: Last 1 Day]  Recorded: [id number] 02:57PM  Blood Pressure: 160 / 95 Temperature: 98.2 F Heart Rate: 96  Physical Exam Constitutional: Well nourished and well developed . No acute distress.  ENT:. The ears and nose are normal in appearance.  Neck: The appearance of the neck is normal and no neck mass is present.  Pulmonary: No respiratory distress and normal respiratory rhythm and effort.  Cardiovascular: Heart rate and rhythm are normal . No peripheral edema.  Abdomen: The abdomen is obese. The abdomen is soft and nontender. No masses are palpated. mild left CVA tenderness. No hernias are palpable. No hepatosplenomegaly noted.  Genitourinary: Examination of the penis demonstrates no discharge, no masses, no lesions and a normal  meatus. The scrotum is without lesions. The right epididymis is palpably normal and non-tender. The left epididymis is  palpably normal and non-tender. The right testis is non-tender and without masses. The left testis is non-tender and without masses.  Lymphatics: The femoral and inguinal nodes are not enlarged or tender.  Skin: Normal skin turgor, no visible rash and no visible skin lesions.  Neuro/Psych:. Mood and affect are appropriate.    Results/Data Urine [Data Includes: Last 1 Day]   [id number]  COLOR YELLOW   APPEARANCE CLEAR   SPECIFIC GRAVITY 1.020   pH 6.0   GLUCOSE NEG mg/dL  BILIRUBIN NEG   KETONE NEG mg/dL  BLOOD NEG   PROTEIN NEG mg/dL  UROBILINOGEN 0.2 mg/dL  NITRITE NEG   LEUKOCYTE ESTERASE NEG   Selected Results  UA With REFLEX [id number] 02:37PM [name], [name]  SPECIMEN TYPE: CLEAN CATCH   Test Name Result Flag Reference  COLOR YELLOW  YELLOW  APPEARANCE CLEAR  CLEAR  SPECIFIC GRAVITY 1.020  1.005-1.030  pH 6.0  5.0-8.0  GLUCOSE NEG mg/dL  NEG  BILIRUBIN NEG  NEG  KETONE NEG mg/dL  NEG  BLOOD NEG  NEG  PROTEIN NEG mg/dL  NEG  UROBILINOGEN 0.2 mg/dL  0.0-1.0  NITRITE NEG  NEG  LEUKOCYTE ESTERASE NEG  NEG    KUB shows stone in same location as on prior CT scan. kidneys wnl. Bowel gas pattern normal.  Assessment Assessed  1. Calculus of left ureter (592.1) 2. 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 He is on prednisone 10mg /day. He did well on anti-inflammatory for his stone pain. he will have injection today. No infection.   Plan Calculus of left ureter  1. Follow-up Schedule Surgery Office  Follow-up  Status: Complete  Done: [id number] 2. KUB; Status:Resulted - Requires Verification;   Done: [id number] 12:00AM Calculus of left ureter, Hypogonadism, testicular  3. Administered: Ketorolac Tromethamine 60 MG/2ML Intramuscular Solution  Schedule surgery. Stop ASA 5 days before surgery. Will need booster dose of steroid.   Discussion/Summary cc: Carolann Littler, MD   cc: Christie Nottingham     Signatures Electronically signed by  : Carolan Clines, M.D.; May 09 2013  5:20PM EST

## 2013-05-26 NOTE — Transfer of Care (Signed)
Immediate Anesthesia Transfer of Care Note  Patient: [name]  Procedure(s) Performed: Procedure(s) (LRB): CYSTOSCOPY WITH URETEROSCOPY, URETHERAL DILATATION,LEFT RETROGRADE, LASER LITHOTRIPSY, STENT PLACMENT, LEFT URETEROSCOPY (Left) HOLMIUM LASER APPLICATION (Left)  Patient Location: PACU  Anesthesia Type: General  Level of Consciousness: awake, sedated, patient cooperative and responds to stimulation  Airway & Oxygen Therapy: Patient Spontanous Breathing and Patient connected to face mask oxygen  Post-op Assessment: Report given to PACU RN, Post -op Vital signs reviewed and stable and Patient moving all extremities  Post vital signs: Reviewed and stable  Complications: No apparent anesthesia complications

## 2013-05-30 ENCOUNTER — Encounter (HOSPITAL_BASED_OUTPATIENT_CLINIC_OR_DEPARTMENT_OTHER): Payer: Self-pay | Admitting: Urology

## 2013-07-06 ENCOUNTER — Other Ambulatory Visit: Payer: Self-pay | Admitting: Family Medicine

## 2013-10-16 ENCOUNTER — Other Ambulatory Visit: Payer: Self-pay | Admitting: Family Medicine

## 2014-01-10 ENCOUNTER — Ambulatory Visit (INDEPENDENT_AMBULATORY_CARE_PROVIDER_SITE_OTHER): Payer: 59 | Admitting: Family Medicine

## 2014-01-10 ENCOUNTER — Encounter: Payer: Self-pay | Admitting: Family Medicine

## 2014-01-10 VITALS — BP 120/84 | Temp 98.1°F | Wt 256.0 lb

## 2014-01-10 DIAGNOSIS — J069 Acute upper respiratory infection, unspecified: Secondary | ICD-10-CM

## 2014-01-10 DIAGNOSIS — B9789 Other viral agents as the cause of diseases classified elsewhere: Secondary | ICD-10-CM

## 2014-01-10 DIAGNOSIS — R319 Hematuria, unspecified: Secondary | ICD-10-CM

## 2014-01-10 DIAGNOSIS — N200 Calculus of kidney: Secondary | ICD-10-CM

## 2014-01-10 DIAGNOSIS — M545 Low back pain, unspecified: Secondary | ICD-10-CM

## 2014-01-10 LAB — POCT URINALYSIS DIPSTICK
BILIRUBIN UA: NEGATIVE
Glucose, UA: NEGATIVE
Ketones, UA: NEGATIVE
Leukocytes, UA: NEGATIVE
NITRITE UA: NEGATIVE
PROTEIN UA: NEGATIVE
Spec Grav, UA: 1.02
UROBILINOGEN UA: 0.2
pH, UA: 6

## 2014-01-10 MED ORDER — OXYCODONE-ACETAMINOPHEN 5-325 MG PO TABS: ORAL_TABLET | ORAL | Status: DC

## 2014-01-10 MED ORDER — HYDROCODONE-HOMATROPINE 5-1.5 MG/5ML PO SYRP: 5.0000 mL | ORAL_SOLUTION | Freq: Three times a day (TID) | ORAL | Status: DC | PRN

## 2014-01-10 NOTE — Patient Instructions (Signed)
Drink lots of water,,,,,,,,,,, strain all urines,,,,,,,,,,, Percocet,,,,,,, one half to one tablet 3 times daily for severe pain,,,,,,,,, call today and make a followup appointment with Dr. Gaynelle Arabian ASAP  For your viral syndrome,,,,,,,,,,, Tylenol,,,,,,,, lots of liquids,,,,,,,, Hydromet,,,,,,,, 1/2-1 teaspoon 3 times daily. For cough and cold,

## 2014-01-10 NOTE — Progress Notes (Signed)
   Subjective:    Patient ID: [name], male    DOB: 01/30/1951, 63 y.o.   MRN: [id number]  HPI [name] is a 63 year old male who comes in today for evaluation of 2 problems  4 days ago he developed sore throat head congestion and cough. He has a history of asthma in the past.  2 days ago began having right flank pain. The pain is constant and seems to come and go. He had 3 kidney stones on the left there were extracted by Dr. Gaynelle Arabian in the past   Review of Systems    review of systems otherwise negative Objective:   Physical Exam  Well-developed well-nourished male no acute distress vital signs stable he is afebrile HEENT negative neck was supple no adenopathy lungs are clear no wheezing  Urinalysis shows large blood      Assessment & Plan:  Viral syndrome treat symptomatically  Right-sided kidney stone......... increase water intake......... strain urines.......... pain medication...Marland KitchenMarland KitchenMarland Kitchen followed by Dr. Gaynelle Arabian.

## 2014-01-10 NOTE — Progress Notes (Signed)
Pre visit review using our clinic review tool, if applicable. No additional management support is needed unless otherwise documented below in the visit note. 

## 2014-02-06 ENCOUNTER — Ambulatory Visit (INDEPENDENT_AMBULATORY_CARE_PROVIDER_SITE_OTHER): Payer: 59 | Admitting: *Deleted

## 2014-02-06 DIAGNOSIS — Z23 Encounter for immunization: Secondary | ICD-10-CM

## 2014-02-09 ENCOUNTER — Other Ambulatory Visit: Payer: Self-pay | Admitting: Family Medicine

## 2014-03-02 ENCOUNTER — Encounter: Payer: Self-pay | Admitting: Gastroenterology

## 2014-04-09 ENCOUNTER — Other Ambulatory Visit: Payer: Self-pay | Admitting: Family Medicine

## 2014-05-23 ENCOUNTER — Telehealth: Payer: Self-pay | Admitting: Family Medicine

## 2014-05-23 NOTE — Telephone Encounter (Signed)
Patient would like to see Dr. Sherren Mocha tomorrow for off and on pain, lower right side, and unable to lay on that side.   Please advise if patient can be seen.  He states the pain has gotten worse.

## 2014-05-23 NOTE — Telephone Encounter (Signed)
[facility] - [facility] [place] [facility] Patient Name: [name] DOB: [birth year] Initial Comment Caller states has pain in right side about 4 inches above belt. Hurt for a few days. Seems to have gotten worse. Nurse Assessment Nurse: Martyn Ehrich RN, Felicia Date/Time (Eastern Time): 05/23/2014 3:32:35 PM Confirm and document reason for call. If symptomatic, describe symptoms. ---Pt is having on R back pain 4 " above belt - behind ribs. It has been worse the last few days. No fever. Started 4-5 d Has the patient traveled out of the country within the last 30 days? ---No Does the patient require triage? ---Yes Related visit to physician within the last 2 weeks? ---No Does the PT have any chronic conditions? (i.e. diabetes, asthma, etc.) ---No Guidelines Guideline Title Affirmed Question Affirmed Notes Chest Pain [1] Chest pain lasts > 5 minutes AND [2] age > 86 Final Disposition User Call EMS 911 Now [place], RN, [place] Comments Pain is not in the middle of the back Pt says that he had kidney stones in the past and it is not that

## 2014-05-24 NOTE — Telephone Encounter (Signed)
Spoke with patient and he feels it "a muscle pain and he's doing better. Call Monday if not better".

## 2014-06-07 ENCOUNTER — Ambulatory Visit (INDEPENDENT_AMBULATORY_CARE_PROVIDER_SITE_OTHER): Payer: PPO | Admitting: Family Medicine

## 2014-06-07 ENCOUNTER — Telehealth: Payer: Self-pay | Admitting: Family Medicine

## 2014-06-07 ENCOUNTER — Encounter: Payer: Self-pay | Admitting: Family Medicine

## 2014-06-07 VITALS — BP 120/88 | HR 95 | Temp 98.2°F | Wt 255.0 lb

## 2014-06-07 DIAGNOSIS — J4521 Mild intermittent asthma with (acute) exacerbation: Secondary | ICD-10-CM

## 2014-06-07 MED ORDER — PREDNISONE 20 MG PO TABS: ORAL_TABLET | ORAL | Status: DC

## 2014-06-07 MED ORDER — HYDROCODONE-HOMATROPINE 5-1.5 MG/5ML PO SYRP: 5.0000 mL | ORAL_SOLUTION | Freq: Three times a day (TID) | ORAL | Status: DC | PRN

## 2014-06-07 NOTE — Progress Notes (Signed)
   Subjective:    Patient ID: [name], male    DOB: [birth year], 64 y.o.   MRN: [id number]  HPI [name] is a 64 year old married male nonsmoker who comes in today with a cold and cough for 4 days  4 days ago he developed head congestion runny nose and cough. The sore throat initially was severe now it's gone away. The cough this seems to be getting worse. No fever no sputum production. He has had a history of asthma in the past and pneumonia once. He's now a nonsmoker   Review of Systems    review of systems otherwise negative Objective:   Physical Exam  Well-developed well-nourished male no acute distress vital signs stable he is afebrile HEENT were negative neck was supple no adenopathy lungs are clear except for some mild late wheezing on forced expiration      Assessment & Plan:  Viral syndrome with secondary mild asthma..... Treat symptomatically and short course of prednisone for the wheezing.

## 2014-06-07 NOTE — Telephone Encounter (Signed)
Spoke with patient and he will go to urology to have his testosterone checked.

## 2014-06-07 NOTE — Progress Notes (Signed)
Pre visit review using our clinic review tool, if applicable. No additional management support is needed unless otherwise documented below in the visit note. 

## 2014-06-07 NOTE — Telephone Encounter (Signed)
Pt would like to add testosterone labs to his cpx labs on 4/21 and anything else dr todd wants

## 2014-06-07 NOTE — Patient Instructions (Signed)
Drink lots of water  Vaporizer  Hydromet,,,,,,, 1/2-1 teaspoon at bedtime when necessary for cough  Prednisone 20 mg,,,,,,,,,, 2 tabs 3 days then taper as outlined  Return when necessary

## 2014-06-08 ENCOUNTER — Other Ambulatory Visit: Payer: Self-pay | Admitting: Family Medicine

## 2014-08-09 ENCOUNTER — Other Ambulatory Visit: Payer: Self-pay | Admitting: Family Medicine

## 2014-08-16 ENCOUNTER — Telehealth: Payer: Self-pay

## 2014-08-16 ENCOUNTER — Telehealth: Payer: Self-pay | Admitting: *Deleted

## 2014-08-16 ENCOUNTER — Other Ambulatory Visit (INDEPENDENT_AMBULATORY_CARE_PROVIDER_SITE_OTHER): Payer: PPO

## 2014-08-16 ENCOUNTER — Encounter: Payer: Self-pay | Admitting: Nurse Practitioner

## 2014-08-16 ENCOUNTER — Ambulatory Visit (INDEPENDENT_AMBULATORY_CARE_PROVIDER_SITE_OTHER)
Admission: RE | Admit: 2014-08-16 | Discharge: 2014-08-16 | Disposition: A | Discharge status: Home / Self Care | Payer: PPO | Source: Ambulatory Visit | Attending: Nurse Practitioner | Admitting: Nurse Practitioner

## 2014-08-16 ENCOUNTER — Ambulatory Visit (INDEPENDENT_AMBULATORY_CARE_PROVIDER_SITE_OTHER): Payer: PPO | Admitting: Nurse Practitioner

## 2014-08-16 VITALS — BP 126/70 | HR 84 | Ht 71.0 in | Wt 257.4 lb

## 2014-08-16 DIAGNOSIS — Z0000 Encounter for general adult medical examination without abnormal findings: Secondary | ICD-10-CM | POA: Diagnosis not present

## 2014-08-16 DIAGNOSIS — T189XXA Foreign body of alimentary tract, part unspecified, initial encounter: Secondary | ICD-10-CM | POA: Diagnosis not present

## 2014-08-16 LAB — LIPID PANEL
CHOL/HDL RATIO: 5
Cholesterol: 229 mg/dL — ABNORMAL HIGH (ref 0–200)
HDL: 42.2 mg/dL (ref >39.00)
LDL CALC: 157 mg/dL — AB (ref 0–99)
NONHDL: 186.8
TRIGLYCERIDES: 150 mg/dL — AB (ref 0.0–149.0)
VLDL: 30 mg/dL (ref 0.0–40.0)

## 2014-08-16 LAB — POCT URINALYSIS DIPSTICK
BILIRUBIN UA: NEGATIVE
Blood, UA: NEGATIVE
Glucose, UA: NEGATIVE
KETONES UA: NEGATIVE
Leukocytes, UA: NEGATIVE
Nitrite, UA: NEGATIVE
Protein, UA: NEGATIVE
Spec Grav, UA: 1.025
Urobilinogen, UA: 0.2
pH, UA: 5.5

## 2014-08-16 LAB — CBC WITH DIFFERENTIAL/PLATELET
BASOS PCT: 0.8 % (ref 0.0–3.0)
Basophils Absolute: 0.1 10*3/uL (ref 0.0–0.1)
EOS ABS: 0.4 10*3/uL (ref 0.0–0.7)
Eosinophils Relative: 5.4 % — ABNORMAL HIGH (ref 0.0–5.0)
HCT: 45.7 % (ref 39.0–52.0)
HEMOGLOBIN: 15.9 g/dL (ref 13.0–17.0)
LYMPHS PCT: 17.8 % (ref 12.0–46.0)
Lymphs Abs: 1.4 10*3/uL (ref 0.7–4.0)
MCHC: 34.7 g/dL (ref 30.0–36.0)
MCV: 95.8 fl (ref 78.0–100.0)
Monocytes Absolute: 0.9 10*3/uL (ref 0.1–1.0)
Monocytes Relative: 11.5 % (ref 3.0–12.0)
Neutro Abs: 5 10*3/uL (ref 1.4–7.7)
Neutrophils Relative %: 64.5 % (ref 43.0–77.0)
Platelets: 232 10*3/uL (ref 150.0–400.0)
RBC: 4.77 Mil/uL (ref 4.22–5.81)
RDW: 13.2 % (ref 11.5–15.5)
WBC: 7.7 10*3/uL (ref 4.0–10.5)

## 2014-08-16 LAB — BASIC METABOLIC PANEL
BUN: 11 mg/dL (ref 6–23)
CO2: 29 mEq/L (ref 19–32)
Calcium: 9.5 mg/dL (ref 8.4–10.5)
Chloride: 103 mEq/L (ref 96–112)
Creatinine, Ser: 1.21 mg/dL (ref 0.40–1.50)
GFR: 63.91 mL/min (ref >60.00)
Glucose, Bld: 123 mg/dL — ABNORMAL HIGH (ref 70–99)
POTASSIUM: 4.8 meq/L (ref 3.5–5.1)
Sodium: 140 mEq/L (ref 135–145)

## 2014-08-16 LAB — HEPATIC FUNCTION PANEL
ALBUMIN: 4.3 g/dL (ref 3.5–5.2)
ALT: 43 U/L (ref 0–53)
AST: 22 U/L (ref 0–37)
Alkaline Phosphatase: 50 U/L (ref 39–117)
BILIRUBIN TOTAL: 0.8 mg/dL (ref 0.2–1.2)
Bilirubin, Direct: 0.1 mg/dL (ref 0.0–0.3)
TOTAL PROTEIN: 6.8 g/dL (ref 6.0–8.3)

## 2014-08-16 LAB — PSA: PSA: 0.29 ng/mL (ref 0.10–4.00)

## 2014-08-16 LAB — TSH: TSH: 2.09 u[IU]/mL (ref 0.35–4.50)

## 2014-08-16 NOTE — Telephone Encounter (Signed)
Patient walked in this am and said he was sent by Dr. Sherren Mocha because he swallowed a crown.  The patient reports that he did this 2 days ago.  I spoke with Dr. Sherren Mocha and he wants [place] to order an x-ray and follow this.  I explained that I do not have any providers to see him this morning.  He instructed me to send him to the ER.  I spoke with the patient and he does not want to go to the ER.  He is offered an appt with Tye Savoy RNP at 1:30.  He is very reluctant to come for the appt, he just wants the x-ray.  I explained for [place] to order the x-ray he needs to be seen in the office.  He wants to know if Dr. Sherren Mocha will just order the xray.  I explained Dr. Honor Junes response and again offered the appt with Tye Savoy RNP at 1:30.  Patient will come in at 1:30 and see Tye Savoy RNP

## 2014-08-16 NOTE — Telephone Encounter (Signed)
Per Tye Savoy NP-C, I told [name] that the x-ray results showed that the dental appliance/bridge  he swallowed is right at the rectum. He should be passing it within the next several bowel movements.  I told him if he doesn't pass it he can call us Monday and we may order another X-ray.  He wanted me to tell everyone who he spoke to here thank you.

## 2014-08-16 NOTE — Patient Instructions (Signed)
Please go to the basement level to our radiology department for an x-ray. We will call you with the results.

## 2014-08-17 ENCOUNTER — Encounter: Payer: Self-pay | Admitting: Nurse Practitioner

## 2014-08-17 DIAGNOSIS — T189XXA Foreign body of alimentary tract, part unspecified, initial encounter: Secondary | ICD-10-CM | POA: Insufficient documentation

## 2014-08-17 NOTE — Progress Notes (Signed)
HPI :   Patient is a 64 year old male known to Dr. Fuller Plan. He had a screening colonoscopy in 2007 with findings of a hyperplastic polyp. Patient referred by PCP for evaluation of the swallowed dental appliance. 2 days ago while eating chicken patient realized he swallowed one of his upper bridges. Patient began taking MiraLAX to create loose stool that so that he can hopefully see when the appliance passed. So far patient has not passed the bridge. He has no abdominal pain Past Medical History  Diagnosis Date  . Hyperlipidemia   . Anxiety   . Asthma   . Eczema   . Edema of foot   . Wears dentures     bottom  . HOH (hard of hearing)   . Left ureteral calculus   . Influenza A   . Influenza B     symptoms started 05-16-2013/  dx 05-17-2013 by pcp  . H/O hiatal hernia   . At risk for sleep apnea     STOP-BANG = 4  SENT TO PCP 05-24-2013    Family History  Problem Relation Age of Onset  . Arthritis Other   . Hypertension Other   . Osteoporosis Other   . Heart disease Other   . Lung disease Other   . Colon cancer Neg Hx   . Colon polyps Neg Hx   . Prostate cancer Father   . Esophageal cancer Neg Hx   . Kidney disease Neg Hx   . Heart disease Sister    History  Substance Use Topics  . Smoking status: Never Smoker   . Smokeless tobacco: Never Used  . Alcohol Use: 4.2 oz/week    7 Cans of beer per week     Comment: beer   Current Outpatient Prescriptions  Medication Sig Dispense Refill  . aspirin 81 MG tablet Take 81 mg by mouth daily.    . predniSONE (DELTASONE) 10 MG tablet TAKE 1 TABLET BY MOUTH EVERY DAY... MAX OF 30 DAYS ON INSURANCE 100 tablet 0   No current facility-administered medications for this visit.   Allergies  Allergen Reactions  . Sesame Oil Itching  . Tobramycin Itching    Used in an eye oint-reaction     Review of Systems:All systems reviewed and negative except where noted in HPI.    Dg Abd 2 Views  08/16/2014   CLINICAL DATA:  Foreign  body ingestion.  EXAM: ABDOMEN - 2 VIEW  COMPARISON:  CT 05/01/2013.  FINDINGS: Metallic density noted projected over the region of the rectum. This could represent ingested foreign body (dental appliance). No bowel distention. Degenerative changes lumbar spine.  IMPRESSION: Metallic density noted projected over the region rectum. This could represent ingested foreign body (dental appliance).   Electronically Signed   By: Marcello Moores  Register   On: 08/16/2014 14:40    Physical Exam: BP 126/70 mmHg  Pulse 84  Ht 5\' 11"  (1.803 m)  Wt 257 lb 6 oz (116.745 kg)  BMI 35.91 kg/m2 Constitutional: Pleasant,well-developed, white male in no acute distress. HEENT: Normocephalic and atraumatic. Conjunctivae are normal. No scleral icterus. Neck supple.  Cardiovascular: Normal rate, regular rhythm.  Pulmonary/chest: Effort normal and breath sounds normal. No wheezing, rales or rhonchi. Abdominal: Soft, protuberant, nontender. Bowel sounds active throughout. There are no masses palpable. No hepatomegaly. Extremities: no edema Lymphadenopathy: No cervical adenopathy noted. Neurological: Alert and oriented to person place and time. Skin: Skin is warm and dry. No rashes noted. Psychiatric: Normal mood and affect. Behavior  is normal.   ASSESSMENT AND PLAN:   64 year old male who swallowed a dental appliance 2 days ago. Patient taking MiraLAX, having bowel movements but for so far upper bridge has not passed. Patient feels fine, abdominal exam benign. Will obtain plain films of the abdomen to see where dental appliance is located. Will call patient with results and recommendations.    Addendum: Dr. Henrene Pastor reviewed xray, metallic object projected over rectum. Patient called, if he doesn't pass appliance in next couple of days he will come for repeat xray.

## 2014-08-17 NOTE — Progress Notes (Signed)
Agree 

## 2014-08-23 ENCOUNTER — Encounter: Payer: Self-pay | Admitting: Family Medicine

## 2014-08-23 ENCOUNTER — Ambulatory Visit (INDEPENDENT_AMBULATORY_CARE_PROVIDER_SITE_OTHER): Payer: PPO | Admitting: Family Medicine

## 2014-08-23 VITALS — BP 140/84 | HR 94 | Temp 97.9°F | Ht 69.5 in | Wt 255.8 lb

## 2014-08-23 DIAGNOSIS — Z0000 Encounter for general adult medical examination without abnormal findings: Secondary | ICD-10-CM | POA: Diagnosis not present

## 2014-08-23 DIAGNOSIS — R7309 Other abnormal glucose: Secondary | ICD-10-CM | POA: Diagnosis not present

## 2014-08-23 DIAGNOSIS — R739 Hyperglycemia, unspecified: Secondary | ICD-10-CM

## 2014-08-23 MED ORDER — HYDROCHLOROTHIAZIDE 12.5 MG PO TABS: 12.5000 mg | ORAL_TABLET | Freq: Every day | ORAL | Status: DC

## 2014-08-23 MED ORDER — PREDNISONE 10 MG PO TABS: ORAL_TABLET | ORAL | Status: DC

## 2014-08-23 NOTE — Progress Notes (Signed)
Pre visit review using our clinic review tool, if applicable. No additional management support is needed unless otherwise documented below in the visit note. 

## 2014-08-23 NOTE — Progress Notes (Signed)
   Subjective:    Patient ID: [name], male    DOB: 09-20-1950, 64 y.o.   MRN: [id number]  HPI [name] is a 64 year old married male nonsmoker who comes in today for general physical exam  He's always been in excellent health he said no chronic health problems which he takes any chronic medication. He does have a history of psoriasis. He takes prednisone 10 mg daily. This dose is been enough to keep his psoriasis a day. He's tried other medications and they don't seem to help as much as the low-dose prednisone. Every 3 months he tapers off and stays off her about a month.  He gets routine eye care, dental care, colonoscopy 2007 normal,  Family history pertinent data hypertension mom died of COPD from smoking  BP on his entrance today 170/94. He's never had high blood pressure in the past  Vaccinations up-to-date  He swallowed his dentures last week. They did come out intact but in 2 pieces.   Review of Systems  Constitutional: Negative.   HENT: Negative.   Eyes: Negative.   Respiratory: Negative.   Cardiovascular: Negative.   Gastrointestinal: Negative.   Endocrine: Negative.   Genitourinary: Negative.   Musculoskeletal: Negative.   Skin: Negative.   Allergic/Immunologic: Negative.   Neurological: Negative.   Hematological: Negative.   Psychiatric/Behavioral: Negative.        Objective:   Physical Exam  Constitutional: He is oriented to person, place, and time. He appears well-developed and well-nourished.  HENT:  Head: Normocephalic and atraumatic.  Right Ear: External ear normal.  Left Ear: External ear normal.  Nose: Nose normal.  Mouth/Throat: Oropharynx is clear and moist.  Eyes: Conjunctivae and EOM are normal. Pupils are equal, round, and reactive to light.  Neck: Normal range of motion. Neck supple. No JVD present. No tracheal deviation present. No thyromegaly present.  Cardiovascular: Normal rate, regular rhythm, normal heart sounds and intact distal  pulses.  Exam reveals no gallop and no friction rub.   No murmur heard. No carotid aortic bruits follow-up BP 140/84  Pulmonary/Chest: Effort normal and breath sounds normal. No stridor. No respiratory distress. He has no wheezes. He has no rales. He exhibits no tenderness.  Abdominal: Soft. Bowel sounds are normal. He exhibits no distension and no mass. There is no tenderness. There is no rebound and no guarding.  Ventral hernia  Genitourinary: Rectum normal, prostate normal and penis normal. Guaiac negative stool. No penile tenderness.  Musculoskeletal: Normal range of motion. He exhibits edema. He exhibits no tenderness.  Lymphadenopathy:    He has no cervical adenopathy.  Neurological: He is alert and oriented to person, place, and time. He has normal reflexes. No cranial nerve deficit. He exhibits normal muscle tone.  Skin: Skin is warm and dry. No rash noted. No erythema. No pallor.  Scar left shoulder from previous surgery also scar left neck from previous anterior cervical discectomy.  Psychiatric: He has a normal mood and affect. His behavior is normal. Judgment and thought content normal.  Nursing note and vitals reviewed.         Assessment & Plan:  Healthy male  Eczema,,,,,,,.... Continue prednisone but decrease to 10 mg Monday Wednesday Friday at diuretic HCTZ 12.5 mg daily monitor BP at home.  Overweight again discussed diet exercise and weight loss  Elevated blood sugar....... carbohydrate free diet.. Exercise.... Follow-up blood sugar and A1c in 6 months.

## 2014-08-23 NOTE — Patient Instructions (Signed)
Sugar free diet  Walk 30 minutes daily  Follow-up in 6 months  Fasting labs one week prior  Hydrochlorothiazide 12.5 mg........Marland Kitchen 1 daily every morning  Check your blood pressure daily in the morning for 2 weeks to be sure it's normal..........Marland Kitchen 135/85 or less  Prednisone 10 mg......... decrease dose to 1 pill Monday Wednesday Friday,,,,,,,,,,, stop this summer if you can

## 2014-12-10 ENCOUNTER — Ambulatory Visit (INDEPENDENT_AMBULATORY_CARE_PROVIDER_SITE_OTHER): Payer: PPO | Admitting: Family

## 2014-12-10 ENCOUNTER — Encounter: Payer: Self-pay | Admitting: Family

## 2014-12-10 VITALS — BP 120/80 | HR 93 | Temp 98.8°F | Wt 258.0 lb

## 2014-12-10 DIAGNOSIS — J159 Unspecified bacterial pneumonia: Secondary | ICD-10-CM

## 2014-12-10 DIAGNOSIS — R05 Cough: Secondary | ICD-10-CM | POA: Diagnosis not present

## 2014-12-10 DIAGNOSIS — R059 Cough, unspecified: Secondary | ICD-10-CM

## 2014-12-10 DIAGNOSIS — R5383 Other fatigue: Secondary | ICD-10-CM | POA: Diagnosis not present

## 2014-12-10 MED ORDER — PREDNISONE 20 MG PO TABS: 40.0000 mg | ORAL_TABLET | Freq: Every day | ORAL | Status: AC

## 2014-12-10 MED ORDER — DOXYCYCLINE HYCLATE 100 MG PO TABS: 100.0000 mg | ORAL_TABLET | Freq: Two times a day (BID) | ORAL | Status: DC

## 2014-12-10 MED ORDER — HYDROCOD POLST-CPM POLST ER 10-8 MG/5ML PO SUER: 5.0000 mL | Freq: Two times a day (BID) | ORAL | Status: DC | PRN

## 2014-12-10 NOTE — Progress Notes (Signed)
Subjective:    Patient ID: [name], male    DOB: 07-17-1950, 64 y.o.   MRN: [id number]  HPI  64 year old, WM, with a history of pneumonia presents today with a 2 week history of worsening cough, congestion, wheezing, fatigue and weakness. Reports taking left over cough med without much relief. Cough productive with green to brown sputum.   Review of Systems  Constitutional: Positive for fever and chills.  HENT: Positive for congestion and sore throat. Negative for rhinorrhea and sinus pressure.   Respiratory: Positive for cough and wheezing. Negative for shortness of breath.   Cardiovascular: Negative.  Negative for chest pain.  Gastrointestinal: Negative.   Endocrine: Negative.   Musculoskeletal: Negative.   Skin: Negative.   Allergic/Immunologic: Negative.   Neurological: Negative.   Psychiatric/Behavioral: Negative.    Past Medical History  Diagnosis Date  . Hyperlipidemia   . Anxiety   . Asthma   . Eczema   . Arthritis   . Edema of foot   . Wears dentures     bottom  . HOH (hard of hearing)   . Left ureteral calculus   . Influenza A   . Influenza B     symptoms started 05-16-2013/  dx 05-17-2013 by pcp  . H/O hiatal hernia   . At risk for sleep apnea     STOP-BANG = 4  SENT TO PCP 05-24-2013    Social History   Social History  . Marital Status: Married    Spouse Name: N/A  . Number of Children: 2  . Years of Education: N/A   Occupational History  . [employer]    Social History Main Topics  . Smoking status: Never Smoker   . Smokeless tobacco: Never Used  . Alcohol Use: 4.2 oz/week    7 Cans of beer per week     Comment: beer  . Drug Use: No  . Sexual Activity: Not on file   Other Topics Concern  . Not on file   Social History Narrative    Past Surgical History  Procedure Laterality Date  . Orif ulnar fracture Right AS CHILD  . Colonoscopy    . Finger fracture surgery  AS CHILD  . Lipoma excision  05/17/2012   Procedure: EXCISION LIPOMA;  Surgeon: Gayland Curry, MD,FACS;  Location: [facility];  Service: General;  Laterality: Left;  incisional biopsy of left shoulder soft tissue mass  . Anterior cervical decomp/discectomy fusion  01-27-2007    C6  --- C7  . Nasal fracture surgery  AS CHILD  . Cystoscopy with ureteroscopy Left 05/26/2013    Procedure: CYSTOSCOPY WITH URETEROSCOPY, URETHERAL DILATATION,LEFT RETROGRADE, LASER LITHOTRIPSY, STENT PLACMENT, LEFT URETEROSCOPY;  Surgeon: Ailene Rud, MD;  Location: [facility];  Service: Urology;  Laterality: Left;  . Holmium laser application Left 08/10/6061    Procedure: HOLMIUM LASER APPLICATION;  Surgeon: Ailene Rud, MD;  Location: [facility];  Service: Urology;  Laterality: Left;    Family History  Problem Relation Age of Onset  . Arthritis Other   . Hypertension Other   . Osteoporosis Other   . Heart disease Other   . Lung disease Other   . Colon cancer Neg Hx   . Colon polyps Neg Hx   . Prostate cancer Father   . Esophageal cancer Neg Hx   . Kidney disease Neg Hx   . Heart disease Sister     Allergies  Allergen Reactions  . Sesame Oil Itching  . Tobramycin Itching    Used in an eye oint-reaction    Current Outpatient Prescriptions on File Prior to Visit  Medication Sig Dispense Refill  . aspirin 81 MG tablet Take 81 mg by mouth daily.    . hydrochlorothiazide (HYDRODIURIL) 12.5 MG tablet Take 1 tablet (12.5 mg total) by mouth daily. 90 tablet 3   No current facility-administered medications on file prior to visit.    BP 120/80 mmHg  Pulse 93  Temp(Src) 98.8 F (37.1 C) (Oral)  Wt 258 lb (117.028 kg)  SpO2 93%chart     Objective:   Physical Exam  Constitutional: He is oriented to person, place, and time. He appears well-developed and well-nourished.  HENT:  Right Ear: External ear normal.  Left Ear: External ear normal.  Mouth/Throat: Oropharynx is clear and  moist.  Neck: Normal range of motion. Neck supple.  Cardiovascular: Normal rate, regular rhythm and normal heart sounds.   Pulmonary/Chest: Effort normal. He has wheezes. He has rales.  Abdominal: Soft.  Musculoskeletal: Normal range of motion.  Neurological: He is alert and oriented to person, place, and time.  Skin: Skin is warm and dry.  Psychiatric: He has a normal mood and affect.          Assessment & Plan:  Corderro was seen today for cough.  Diagnoses and all orders for this visit:  Pneumonia, bacterial  Cough  Other fatigue  Other orders -     doxycycline (VIBRA-TABS) 100 MG tablet; Take 1 tablet (100 mg total) by mouth 2 (two) times daily. -     predniSONE (DELTASONE) 20 MG tablet; Take 2 tablets (40 mg total) by mouth daily. -     chlorpheniramine-HYDROcodone (TUSSIONEX PENNKINETIC ER) 10-8 MG/5ML SUER; Take 5 mLs by mouth every 12 (twelve) hours as needed for cough.   Call the office if symptoms worsen or persist. Recheck as scheduled and sooner as needed.

## 2014-12-10 NOTE — Patient Instructions (Signed)

## 2014-12-10 NOTE — Progress Notes (Signed)
Pre visit review using our clinic review tool, if applicable. No additional management support is needed unless otherwise documented below in the visit note. 

## 2014-12-28 ENCOUNTER — Telehealth: Payer: Self-pay

## 2014-12-28 NOTE — Telephone Encounter (Signed)
The pt called and is hoping to get a refill of his predisone and his antibiotic.  He states he pneumonia symptoms are better, but not resolved.  Pharmacy - [facility]  Pt callback - [phone]

## 2014-12-28 NOTE — Telephone Encounter (Signed)
Left message on machine for patient

## 2014-12-28 NOTE — Telephone Encounter (Signed)
I am not comfortable with calling in refills. He needs an OV to be re-evaluated

## 2014-12-28 NOTE — Telephone Encounter (Signed)
lvmom to schedule pt for apt.

## 2015-02-18 ENCOUNTER — Other Ambulatory Visit (INDEPENDENT_AMBULATORY_CARE_PROVIDER_SITE_OTHER): Payer: PPO

## 2015-02-18 DIAGNOSIS — R739 Hyperglycemia, unspecified: Secondary | ICD-10-CM

## 2015-02-18 DIAGNOSIS — R7309 Other abnormal glucose: Secondary | ICD-10-CM | POA: Diagnosis not present

## 2015-02-18 LAB — LIPID PANEL
CHOL/HDL RATIO: 7
Cholesterol: 215 mg/dL — ABNORMAL HIGH (ref 0–200)
HDL: 32.6 mg/dL — ABNORMAL LOW (ref >39.00)
LDL CALC: 156 mg/dL — AB (ref 0–99)
NONHDL: 182.56
TRIGLYCERIDES: 131 mg/dL (ref 0.0–149.0)
VLDL: 26.2 mg/dL (ref 0.0–40.0)

## 2015-02-18 LAB — HEMOGLOBIN A1C: Hgb A1c MFr Bld: 5.7 % (ref 4.6–6.5)

## 2015-02-18 LAB — BASIC METABOLIC PANEL
BUN: 14 mg/dL (ref 6–23)
CALCIUM: 9.5 mg/dL (ref 8.4–10.5)
CHLORIDE: 104 meq/L (ref 96–112)
CO2: 29 meq/L (ref 19–32)
Creatinine, Ser: 1.25 mg/dL (ref 0.40–1.50)
GFR: 61.45 mL/min (ref >60.00)
GLUCOSE: 118 mg/dL — AB (ref 70–99)
Potassium: 5 mEq/L (ref 3.5–5.1)
Sodium: 139 mEq/L (ref 135–145)

## 2015-02-25 ENCOUNTER — Ambulatory Visit (INDEPENDENT_AMBULATORY_CARE_PROVIDER_SITE_OTHER): Payer: PPO | Admitting: Family Medicine

## 2015-02-25 ENCOUNTER — Encounter: Payer: Self-pay | Admitting: Family Medicine

## 2015-02-25 VITALS — BP 124/80 | Temp 98.2°F | Wt 255.0 lb

## 2015-02-25 DIAGNOSIS — R7309 Other abnormal glucose: Secondary | ICD-10-CM

## 2015-02-25 DIAGNOSIS — Z23 Encounter for immunization: Secondary | ICD-10-CM

## 2015-02-25 DIAGNOSIS — R739 Hyperglycemia, unspecified: Secondary | ICD-10-CM

## 2015-02-25 MED ORDER — PREDNISONE 10 MG PO TABS: ORAL_TABLET | ORAL | Status: DC

## 2015-02-25 NOTE — Progress Notes (Signed)
   Subjective:    Patient ID: [name], male    DOB: Sep 20, 1950, 64 y.o.   MRN: [id number]  HPI [name] is a 64 year old married male nonsmoker who comes in today for follow-up of elevated blood sugar  We follow him in the spring his blood sugar is elevated. He just come off a short course of prednisone. He takes 10 mg a day for a week and then 10 mg Monday Wednesday Friday for a two-week taper 3-4 times yearly when his eczema his hands flares up. He seen dermatology we've tried different lotions potions etc. etc. nothing works except some oral prednisone.  Blood sugar was elevated he was advised to work on his diet exercise and weight loss. Weight is same to 55. Fasting sugar 118 A1c 5.7% he's been off the prednisone now for 6 months. He therefore has some underlying glucose intolerance which is exacerbated by the prednisone   Review of Systems Review of systems otherwise negative except he passed his kidney stone recently    Objective:   Physical Exam  Well-developed well-nourished male no acute distress vital signs stable he is afebrile examination the hands show some skin breakdown on his fifth fingers. No evidence of secondary infection      Assessment & Plan:  Severe eczema of his hands.......Marland Kitchen renew prednisone 10 mg a day for a week and then 10 mg Monday Wednesday Friday for a two-week taper  Glucose intolerance........... avoid carbohydrates...Marland KitchenMarland KitchenMarland Kitchen work on weight loss,,,, follow-up CPX in the spring

## 2015-02-25 NOTE — Patient Instructions (Addendum)
Avoid carbohydrates........ walk 30 minutes daily......... follow-up physical exam the third week in April  [facility] or [facility]......... are 2 new adult nurse practitioner's.......... set up your physical in April with them so you can establish long-term care with one of them  Prednisone 10 mg......Marland Kitchen 1 tab daily for 7 days then taper by taking 1 tablet Monday Wednesday Friday for a two-week taper

## 2015-02-25 NOTE — Progress Notes (Signed)
Pre visit review using our clinic review tool, if applicable. No additional management support is needed unless otherwise documented below in the visit note. Lab Results  Component Value Date   HGBA1C 5.7 02/18/2015   Lab Results  Component Value Date   LDLCALC 156* 02/18/2015   CREATININE 1.25 02/18/2015

## 2015-08-28 ENCOUNTER — Other Ambulatory Visit: Payer: Self-pay | Admitting: Family Medicine

## 2015-09-02 ENCOUNTER — Encounter: Payer: Self-pay | Admitting: Gastroenterology

## 2015-09-11 ENCOUNTER — Encounter: Payer: Self-pay | Admitting: Family Medicine

## 2015-09-11 ENCOUNTER — Ambulatory Visit (INDEPENDENT_AMBULATORY_CARE_PROVIDER_SITE_OTHER): Payer: PPO | Admitting: Family Medicine

## 2015-09-11 VITALS — BP 126/88 | HR 94 | Temp 98.7°F | Wt 258.7 lb

## 2015-09-11 DIAGNOSIS — M109 Gout, unspecified: Secondary | ICD-10-CM

## 2015-09-11 DIAGNOSIS — M10022 Idiopathic gout, left elbow: Secondary | ICD-10-CM | POA: Diagnosis not present

## 2015-09-11 LAB — CBC WITH DIFFERENTIAL/PLATELET
BASOS ABS: 0 10*3/uL (ref 0.0–0.1)
Basophils Relative: 0.3 % (ref 0.0–3.0)
EOS ABS: 0.1 10*3/uL (ref 0.0–0.7)
Eosinophils Relative: 0.6 % (ref 0.0–5.0)
HEMATOCRIT: 46.8 % (ref 39.0–52.0)
Hemoglobin: 16.4 g/dL (ref 13.0–17.0)
LYMPHS PCT: 10.1 % — AB (ref 12.0–46.0)
Lymphs Abs: 1 10*3/uL (ref 0.7–4.0)
MCHC: 34.9 g/dL (ref 30.0–36.0)
MCV: 95.1 fl (ref 78.0–100.0)
MONOS PCT: 7 % (ref 3.0–12.0)
Monocytes Absolute: 0.7 10*3/uL (ref 0.1–1.0)
NEUTROS PCT: 82 % — AB (ref 43.0–77.0)
Neutro Abs: 8.4 10*3/uL — ABNORMAL HIGH (ref 1.4–7.7)
PLATELETS: 252 10*3/uL (ref 150.0–400.0)
RBC: 4.92 Mil/uL (ref 4.22–5.81)
RDW: 13.6 % (ref 11.5–15.5)
WBC: 10.2 10*3/uL (ref 4.0–10.5)

## 2015-09-11 LAB — URIC ACID: Uric Acid, Serum: 7.1 mg/dL (ref 4.0–7.8)

## 2015-09-11 MED ORDER — HYDROCODONE-ACETAMINOPHEN 10-325 MG PO TABS: 1.0000 | ORAL_TABLET | Freq: Four times a day (QID) | ORAL | Status: DC | PRN

## 2015-09-11 MED ORDER — PREDNISONE 10 MG PO TABS: ORAL_TABLET | ORAL | Status: DC

## 2015-09-11 NOTE — Progress Notes (Signed)
   Subjective:    Patient ID: [name], male    DOB: [birth year], 65 y.o.   MRN: [id number]  HPI Here for the onset yesterday of pain and swelling in the left elbow and to a lesser degree the left wrist. It feels warm to the touch. No fever. No recent trauma. The left elbow did this same thing about a month ago and he took aspirin for a few days. At that time is went back down for a few weeks. He currently takes a 10 mg prednisone every other day for degenerative arthritis.    Review of Systems  Constitutional: Negative.   Respiratory: Negative.   Cardiovascular: Negative.   Musculoskeletal: Positive for joint swelling and arthralgias.       Objective:   Physical Exam  Constitutional: He appears well-developed and well-nourished.  Cardiovascular: Normal rate, regular rhythm, normal heart sounds and intact distal pulses.   Pulmonary/Chest: Effort normal and breath sounds normal.  Musculoskeletal:  Left elbow is swollen, warm, and quite tender. The entire joint is involved, not just the olecranon bursa. ROM is full. The left wrist is mildly swollen and mildly tender.           Assessment & Plan:  Probable gout attack. Treat with a Prednisone taper. Use Norco for pain. Check a uric acid level and a CBC today.  Laurey Morale, MD

## 2015-09-11 NOTE — Progress Notes (Signed)
Pre visit review using our clinic review tool, if applicable. No additional management support is needed unless otherwise documented below in the visit note. 

## 2015-09-13 ENCOUNTER — Telehealth: Payer: Self-pay | Admitting: Family Medicine

## 2015-09-13 NOTE — Telephone Encounter (Signed)
Pt would like results of labs done yesterday. Pt states he still has elbow pain.

## 2015-09-13 NOTE — Telephone Encounter (Signed)
His labs were normal including a normal uric acid level. This makes gout unlikely. His WBC count was normal which makes infection unlikely. It is not clear what the cause of the pain is. The steroids should help it however. If he is not better by next week, we should have a follow up exam

## 2015-09-13 NOTE — Telephone Encounter (Signed)
I spoke with pt  

## 2015-12-26 IMAGING — CR DG ABDOMEN 2V
3 series · 3 of 3 positions shown · non-contrast
Comparison: CT 05/01/2013.

CLINICAL DATA: Foreign body ingestion.

EXAM:
ABDOMEN - 2 VIEW

[view not recorded (1 of 3)]
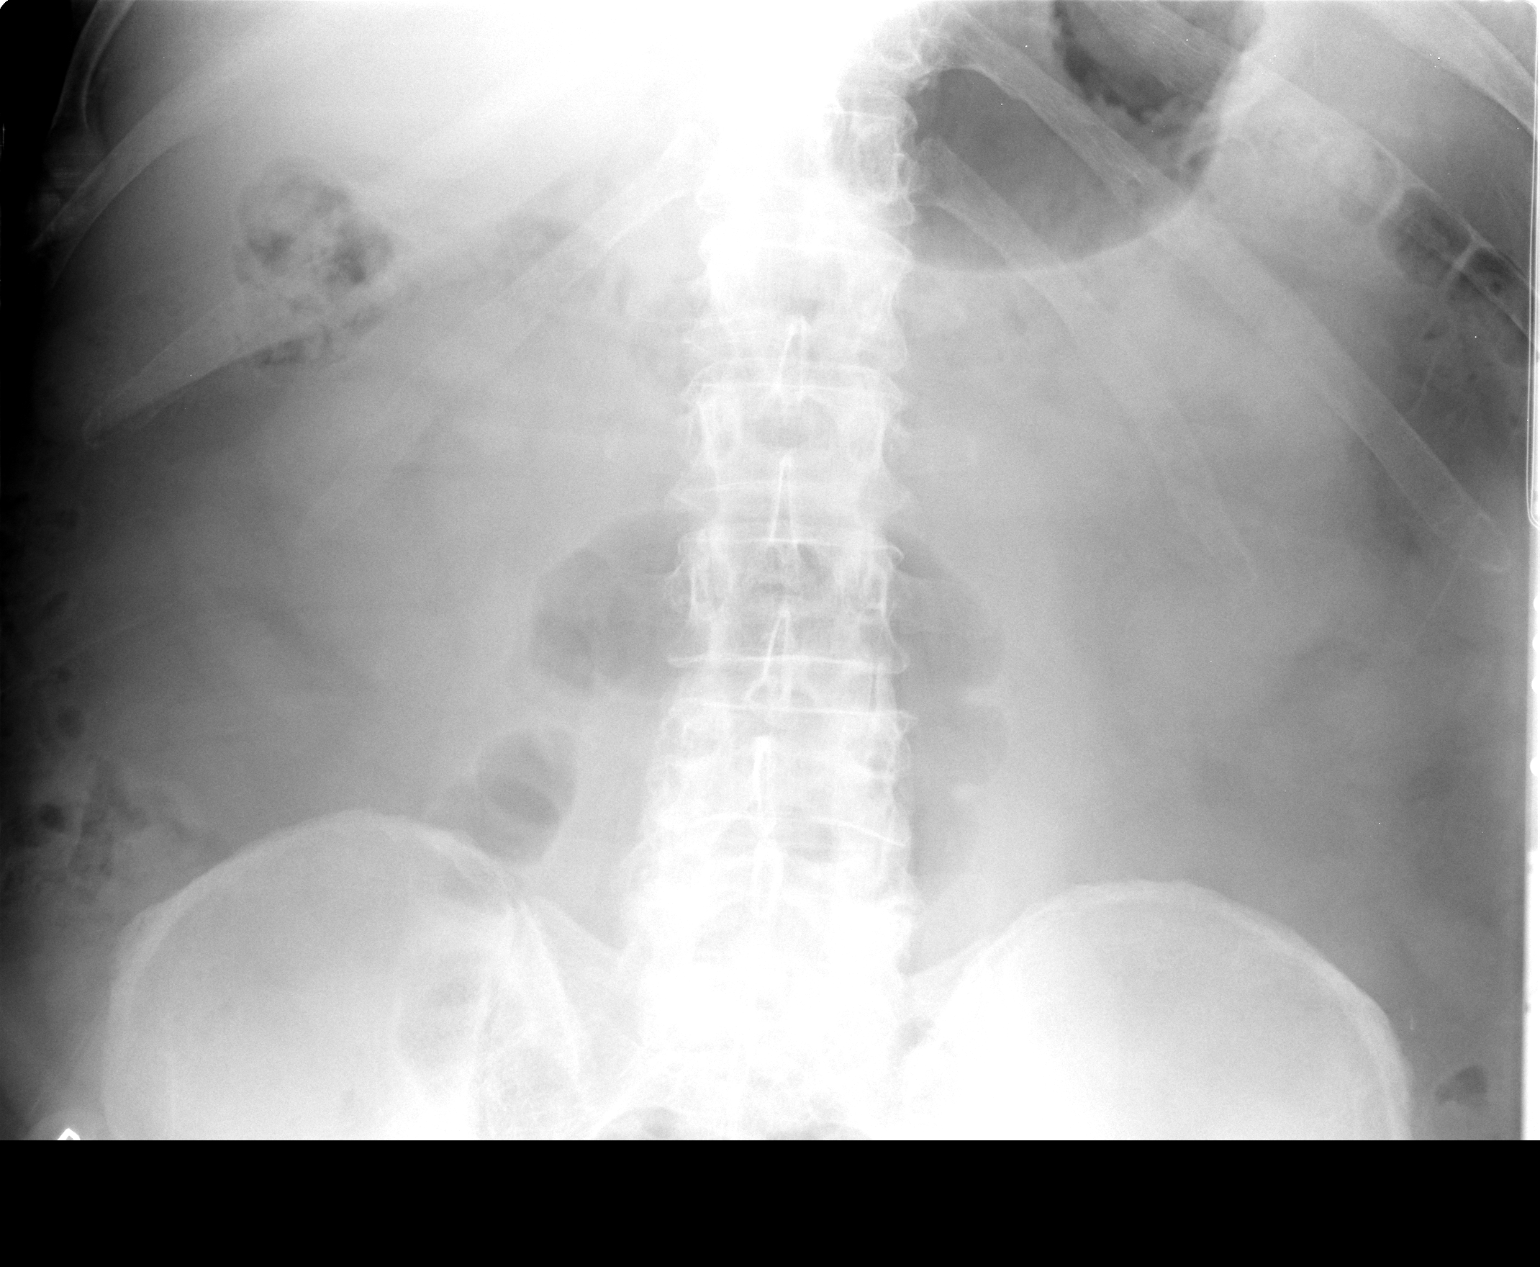

[view not recorded (2 of 3)]
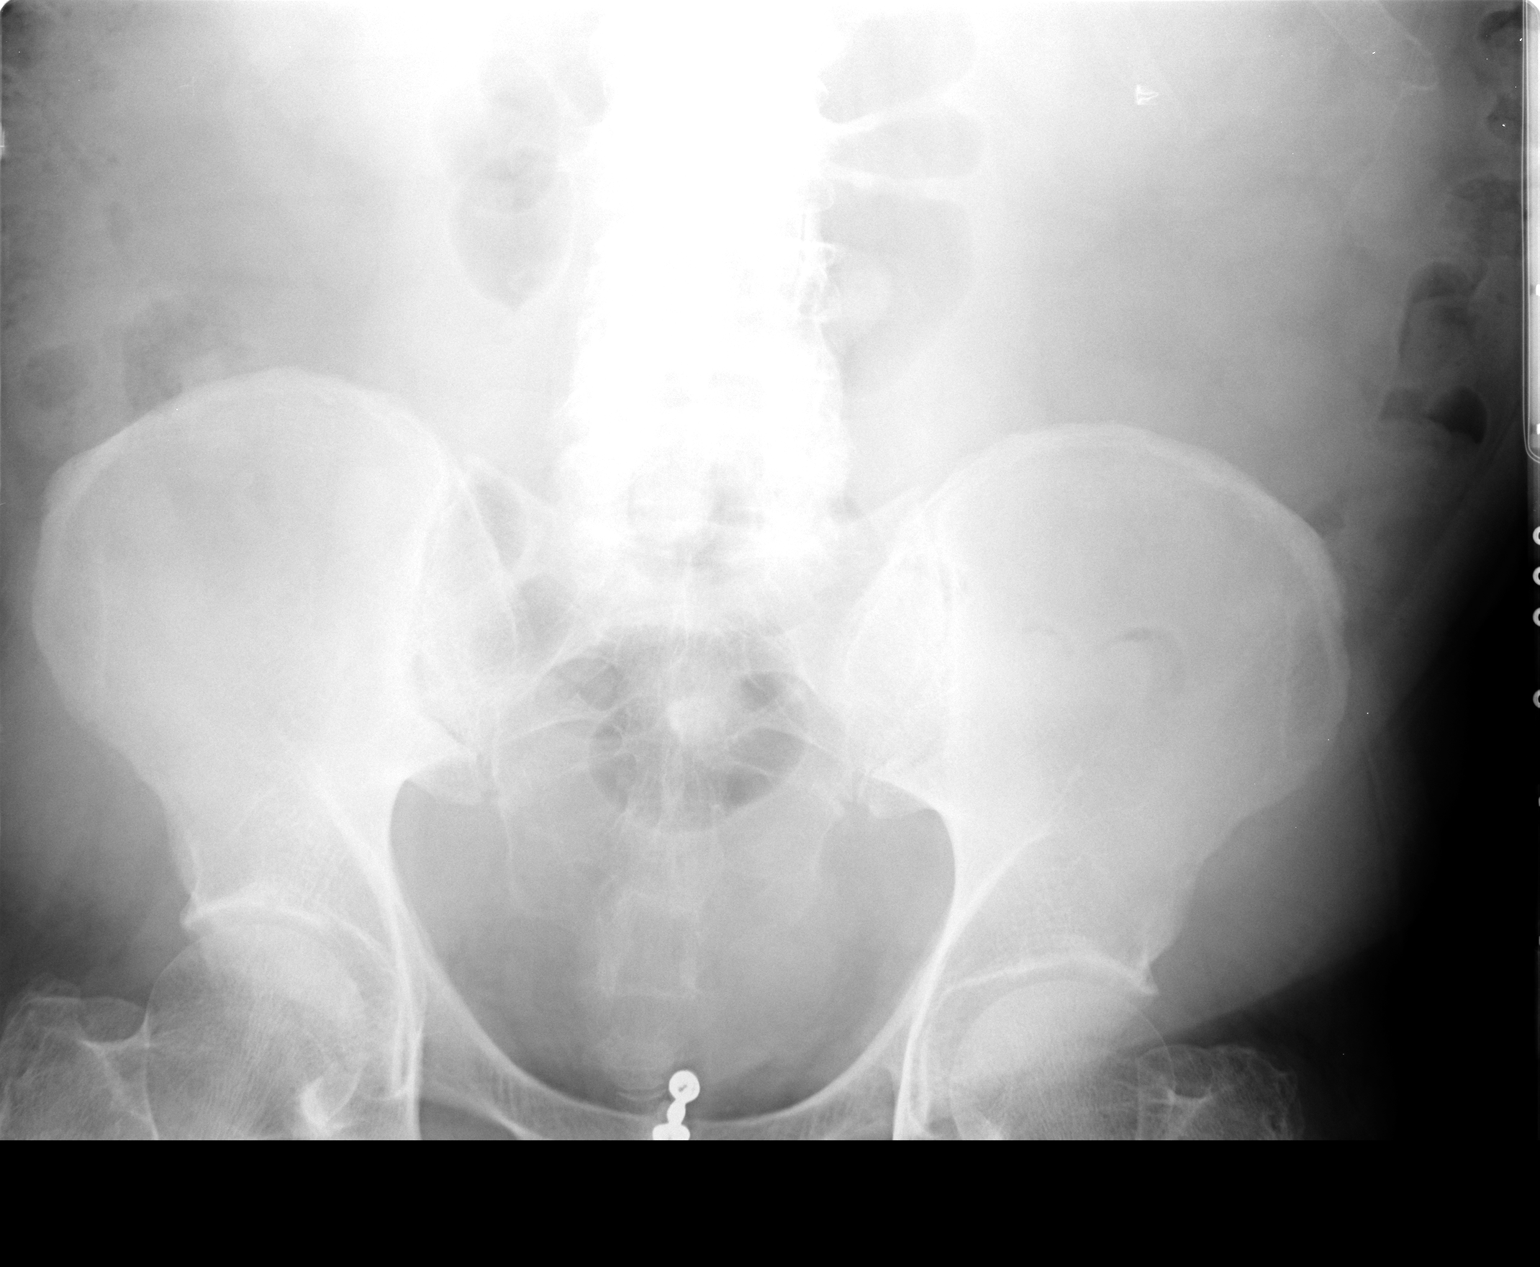

[view not recorded (3 of 3)]
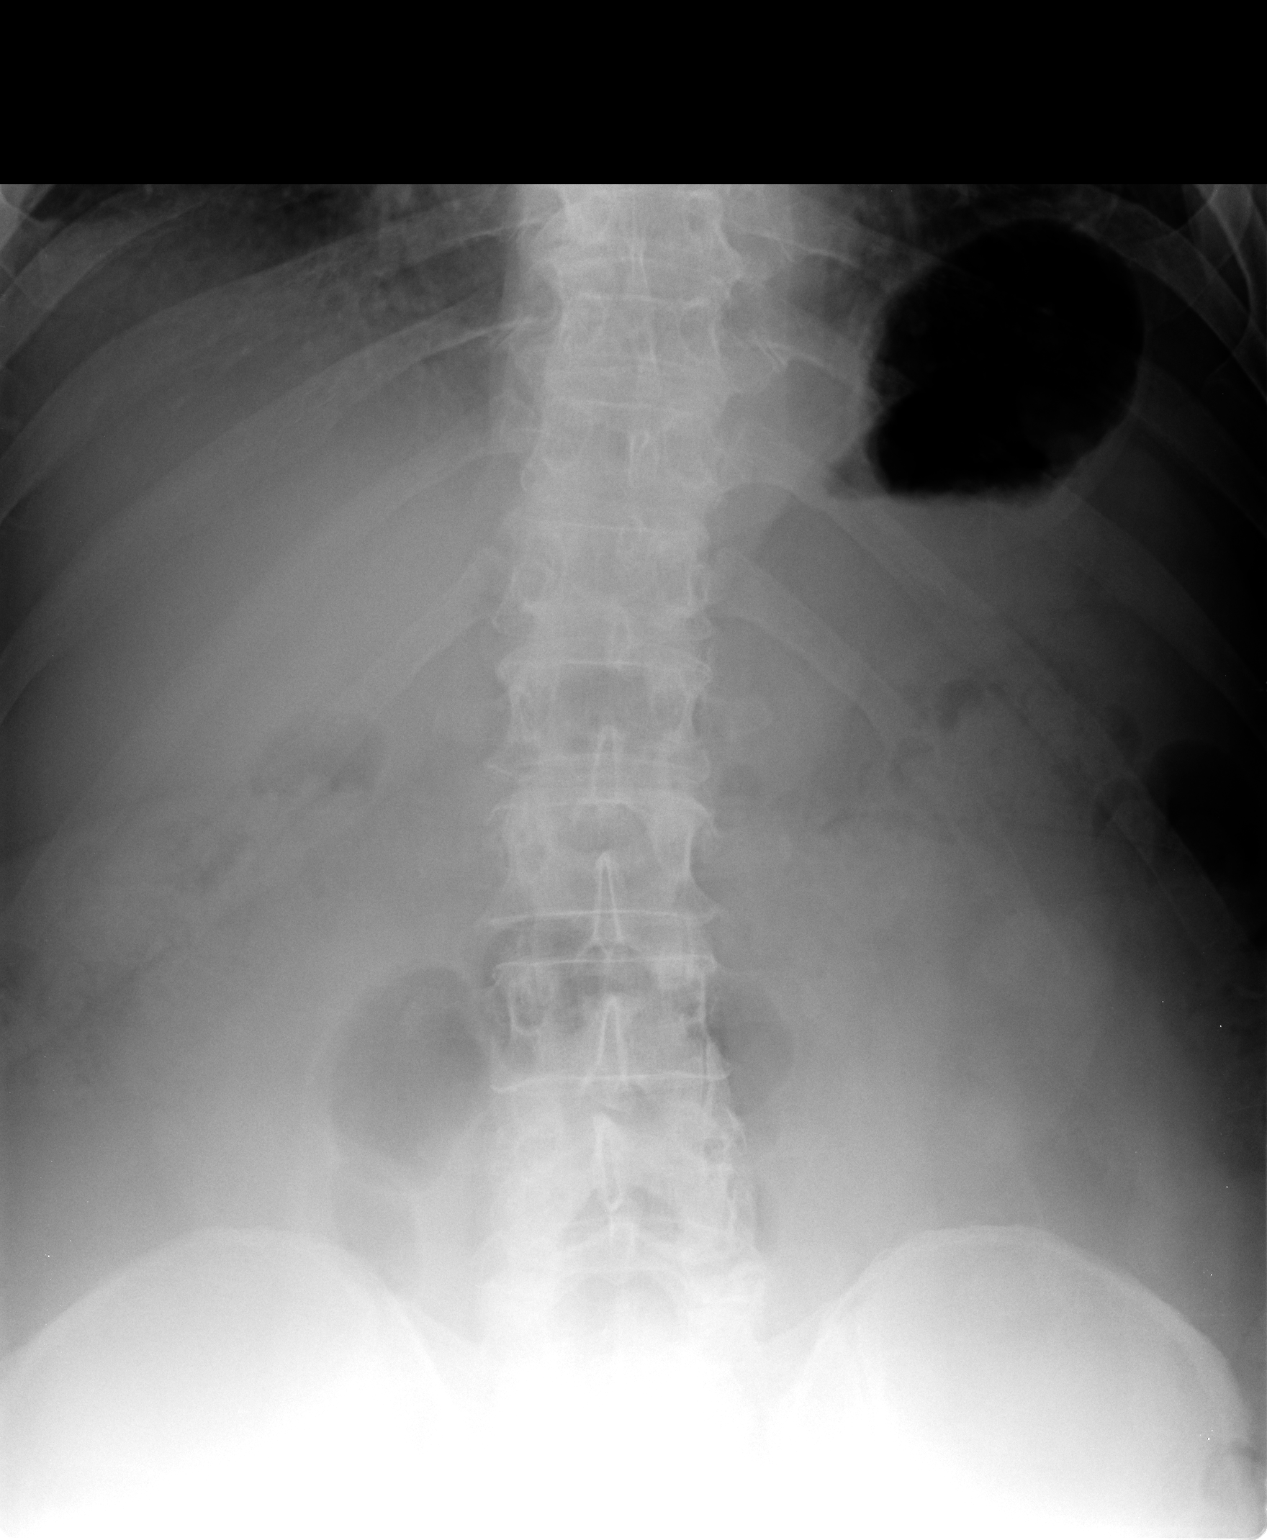

[3 of 3 positions shown; findings below may reference images not displayed]

FINDINGS: Metallic density noted projected over the region of the rectum. This
could represent ingested foreign body (dental appliance). No bowel
distention. Degenerative changes lumbar spine.
IMPRESSION: Metallic density noted projected over the region rectum. This could
represent ingested foreign body (dental appliance).

## 2016-01-21 ENCOUNTER — Other Ambulatory Visit: Payer: Self-pay | Admitting: Family Medicine

## 2016-03-03 ENCOUNTER — Other Ambulatory Visit (INDEPENDENT_AMBULATORY_CARE_PROVIDER_SITE_OTHER): Payer: PPO

## 2016-03-03 DIAGNOSIS — Z0000 Encounter for general adult medical examination without abnormal findings: Secondary | ICD-10-CM

## 2016-03-03 LAB — BASIC METABOLIC PANEL
BUN: 11 mg/dL (ref 6–23)
CALCIUM: 9.2 mg/dL (ref 8.4–10.5)
CO2: 29 meq/L (ref 19–32)
CREATININE: 1.04 mg/dL (ref 0.40–1.50)
Chloride: 106 mEq/L (ref 96–112)
GFR: 75.74 mL/min (ref >60.00)
Glucose, Bld: 98 mg/dL (ref 70–99)
Potassium: 4.7 mEq/L (ref 3.5–5.1)
SODIUM: 142 meq/L (ref 135–145)

## 2016-03-03 LAB — POC URINALSYSI DIPSTICK (AUTOMATED)
Bilirubin, UA: NEGATIVE
KETONES UA: NEGATIVE
Leukocytes, UA: NEGATIVE
Nitrite, UA: NEGATIVE
Protein, UA: NEGATIVE
RBC UA: NEGATIVE
SPEC GRAV UA: 1.025
UROBILINOGEN UA: 1
pH, UA: 5.5

## 2016-03-03 LAB — CBC WITH DIFFERENTIAL/PLATELET
BASOS ABS: 0.1 10*3/uL (ref 0.0–0.1)
Basophils Relative: 1.3 % (ref 0.0–3.0)
Eosinophils Absolute: 0.4 10*3/uL (ref 0.0–0.7)
Eosinophils Relative: 6 % — ABNORMAL HIGH (ref 0.0–5.0)
HEMATOCRIT: 45.9 % (ref 39.0–52.0)
Hemoglobin: 15.7 g/dL (ref 13.0–17.0)
LYMPHS PCT: 18.2 % (ref 12.0–46.0)
Lymphs Abs: 1.3 10*3/uL (ref 0.7–4.0)
MCHC: 34.2 g/dL (ref 30.0–36.0)
MCV: 94.7 fl (ref 78.0–100.0)
MONOS PCT: 10.3 % (ref 3.0–12.0)
Monocytes Absolute: 0.7 10*3/uL (ref 0.1–1.0)
Neutro Abs: 4.6 10*3/uL (ref 1.4–7.7)
Neutrophils Relative %: 64.2 % (ref 43.0–77.0)
Platelets: 223 10*3/uL (ref 150.0–400.0)
RBC: 4.85 Mil/uL (ref 4.22–5.81)
RDW: 13.1 % (ref 11.5–15.5)
WBC: 7.2 10*3/uL (ref 4.0–10.5)

## 2016-03-03 LAB — TSH: TSH: 3.21 u[IU]/mL (ref 0.35–4.50)

## 2016-03-03 LAB — HEPATIC FUNCTION PANEL
ALBUMIN: 4.1 g/dL (ref 3.5–5.2)
ALK PHOS: 47 U/L (ref 39–117)
ALT: 28 U/L (ref 0–53)
AST: 16 U/L (ref 0–37)
Bilirubin, Direct: 0.1 mg/dL (ref 0.0–0.3)
TOTAL PROTEIN: 6.4 g/dL (ref 6.0–8.3)
Total Bilirubin: 0.6 mg/dL (ref 0.2–1.2)

## 2016-03-03 LAB — LIPID PANEL
Cholesterol: 218 mg/dL — ABNORMAL HIGH (ref 0–200)
HDL: 43.9 mg/dL (ref >39.00)
LDL Cholesterol: 154 mg/dL — ABNORMAL HIGH (ref 0–99)
NONHDL: 173.98
Total CHOL/HDL Ratio: 5
Triglycerides: 100 mg/dL (ref 0.0–149.0)
VLDL: 20 mg/dL (ref 0.0–40.0)

## 2016-03-03 LAB — PSA: PSA: 0.19 ng/mL (ref 0.10–4.00)

## 2016-03-10 ENCOUNTER — Encounter: Payer: Self-pay | Admitting: Family Medicine

## 2016-03-10 ENCOUNTER — Ambulatory Visit (INDEPENDENT_AMBULATORY_CARE_PROVIDER_SITE_OTHER): Payer: PPO | Admitting: Family Medicine

## 2016-03-10 VITALS — BP 158/86 | HR 106 | Temp 98.2°F | Ht 69.5 in | Wt 255.3 lb

## 2016-03-10 DIAGNOSIS — Z23 Encounter for immunization: Secondary | ICD-10-CM

## 2016-03-10 DIAGNOSIS — H833X3 Noise effects on inner ear, bilateral: Secondary | ICD-10-CM | POA: Diagnosis not present

## 2016-03-10 DIAGNOSIS — Z0000 Encounter for general adult medical examination without abnormal findings: Secondary | ICD-10-CM | POA: Diagnosis not present

## 2016-03-10 MED ORDER — PREDNISONE 10 MG PO TABS
ORAL_TABLET | ORAL | 4 refills | Status: DC
Start: 2016-03-10 — End: 2016-09-03

## 2016-03-10 MED ORDER — PREDNISONE 1 MG PO TABS: ORAL_TABLET | ORAL | 1 refills | Status: DC

## 2016-03-10 MED ORDER — SILDENAFIL CITRATE 20 MG PO TABS: ORAL_TABLET | ORAL | 11 refills | Status: DC

## 2016-03-10 NOTE — Patient Instructions (Signed)
Let's try to decrease your prednisone dose,,,,,,,,,,,, 9 mg daily for 3 months,,,,,,,, then 8 mg daily for 3 months,,,,,,,,, follow-up in 6 months  Walk 30 minutes daily  Carbohydrate free diet  Omron pump up digital blood pressure cuff,,,,,,,,,, [employer]  Check your blood pressure daily in the morning for 3 weeks,,,,,,, blood pressure goal 135/85 or less,,,,,,,, if not at goal return with the data and the device for follow-up,

## 2016-03-10 NOTE — Progress Notes (Signed)
Wren is a 65 year old married male nonsmoker who comes in today for general physical examination because of a history of obesity, severe dyshidrotic eczema affecting both hands, occasional kidney stone.... Only one in the last 12 months he passed spontaneously.........  His biggest long-term issue is the chronic severe eczema of his hands. The past it was so bad his hands would crack bleeding get infected. We've sent him to number specialist nobody can find a solution except 10 mg of prednisone daily. This is To his hands fairly well symptom-free for the couple years. However we discussed in the past the negative effects of steroids a long-term basis.  His weight is 255 pounds. BMI is 37.67. Discussed these 2 variables and a goal to try to taper off the prednisone over a year or more. We'll try to do this by decreasing dose by 1 mg every 2-3 months.  He gets routine eye care, dental care, colonoscopy 2007 was normal he does not recall getting a recall card.  He's got soreness in both right and left knees. No history of trauma. He also has some soreness in his left hip the tends to radiate down to his left lateral thigh. He also has difficulty with left wrist and left shoulder. When it does bother him a lot he will take 20 mg prednisone for couple days and things quiet down. Family history unchanged  Vaccination history is given a Pneumovax 23 today and a flu shot. Advised to get the shingles vaccine.  Cognitive function normal he walks daily home health safety reviewed no issues identified, no guns in the house, he does have a healthcare power of attorney and living well  Social histories can retire at the end of next month. 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  14 point review of systems are reviewed and are negative except for above  Physical evaluation.vs BP (!) 158/86 (BP Location: Left Arm, Patient Position: Sitting, Cuff Size: Normal)   Pulse (!) 106    Temp 98.2 F (36.8 C) (Oral)   Ht 5' 9.5" (1.765 m)   Wt 255 lb 4.8 oz (115.8 kg)   BMI 37.16 kg/m  Examination of the HEENT were negative neck was supple no adenopathy thyroid normal no carotid bruits cardiopulmonary exam normal except for a grade 2/6 systolic ejection murmur left lower sternal border consistent with some mitral regurg.  Abdominal exam shows a very massive panniculus with a ventral hernia. No palpable masses. 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  Impression #1 severe eczema........ try to taper prednisone by 1 mg every 3 months  #2 obesity...... again as we have in the past discussed diet exercise and weight loss  #3 elevated blood pressure........Marland Kitchen monitor blood pressure at home with digital blood pressure cuff return in 3 weeks if blood pressure not normal......Marland Kitchen 130/80 or less  #4 hearing loss........ continue bilateral hearing aids  #5 erectile dysfunction........ trial of generic Viagra

## 2016-03-10 NOTE — Progress Notes (Signed)
Pre visit review using our clinic review tool, if applicable. No additional management support is needed unless otherwise documented below in the visit note. 

## 2016-05-11 ENCOUNTER — Encounter: Payer: Self-pay | Admitting: Family Medicine

## 2016-05-11 ENCOUNTER — Ambulatory Visit (INDEPENDENT_AMBULATORY_CARE_PROVIDER_SITE_OTHER): Payer: PPO | Admitting: Family Medicine

## 2016-05-11 DIAGNOSIS — M25561 Pain in right knee: Secondary | ICD-10-CM | POA: Insufficient documentation

## 2016-05-11 NOTE — Patient Instructions (Signed)
Motrin 400 mg twice daily with food  Elevation and ice as needed  We'll get you is set up to see the knee specialist at [place]
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
## 2016-05-11 NOTE — Progress Notes (Signed)
Pre visit review using our clinic review tool, if applicable. No additional management support is needed unless otherwise documented below in the visit note. 

## 2016-05-21 DIAGNOSIS — M7541 Impingement syndrome of right shoulder: Secondary | ICD-10-CM | POA: Diagnosis not present

## 2016-05-21 DIAGNOSIS — M25511 Pain in right shoulder: Secondary | ICD-10-CM | POA: Diagnosis not present

## 2016-05-21 DIAGNOSIS — G8929 Other chronic pain: Secondary | ICD-10-CM | POA: Diagnosis not present

## 2016-05-21 DIAGNOSIS — M25561 Pain in right knee: Secondary | ICD-10-CM | POA: Diagnosis not present

## 2016-05-21 DIAGNOSIS — M1711 Unilateral primary osteoarthritis, right knee: Secondary | ICD-10-CM | POA: Diagnosis not present

## 2016-06-09 ENCOUNTER — Encounter: Payer: Self-pay | Admitting: Gastroenterology

## 2016-06-12 ENCOUNTER — Ambulatory Visit (INDEPENDENT_AMBULATORY_CARE_PROVIDER_SITE_OTHER): Payer: PPO | Admitting: Family Medicine

## 2016-06-12 ENCOUNTER — Encounter: Payer: Self-pay | Admitting: Family Medicine

## 2016-06-12 VITALS — BP 145/89 | HR 99 | Temp 100.1°F | Ht 69.5 in | Wt 249.0 lb

## 2016-06-12 DIAGNOSIS — J209 Acute bronchitis, unspecified: Secondary | ICD-10-CM

## 2016-06-12 MED ORDER — AZITHROMYCIN 250 MG PO TABS: ORAL_TABLET | ORAL | 0 refills | Status: DC

## 2016-06-12 MED ORDER — HYDROCODONE-HOMATROPINE 5-1.5 MG/5ML PO SYRP: 5.0000 mL | ORAL_SOLUTION | ORAL | 0 refills | Status: DC | PRN

## 2016-06-12 NOTE — Progress Notes (Signed)
Pre visit review using our clinic review tool, if applicable. No additional management support is needed unless otherwise documented below in the visit note. 

## 2016-06-12 NOTE — Progress Notes (Signed)
   Subjective:    Patient ID: [name], male    DOB: 02-May-1950, 66 y.o.   MRN: [id number]  HPI Here for 3 days of fever, body aches, chest congestion, and a dry cough. Drinking fluids.    Review of Systems  Constitutional: Positive for fever.  HENT: Positive for congestion. Negative for postnasal drip, sinus pain and sinus pressure.   Eyes: Negative.   Respiratory: Positive for cough and chest tightness.        Objective:   Physical Exam  Constitutional: He appears well-developed and well-nourished.  HENT:  Right Ear: External ear normal.  Left Ear: External ear normal.  Nose: Nose normal.  Mouth/Throat: Oropharynx is clear and moist.  Eyes: Conjunctivae are normal.  Neck: No thyromegaly present.  Pulmonary/Chest: Effort normal. No respiratory distress. He has no rales.  Scattered rhonchi and wheezes   Lymphadenopathy:    He has no cervical adenopathy.          Assessment & Plan:  Bronchitis, treat with a Zpack. Add mucinex.  Alysia Penna, MD

## 2016-07-02 DIAGNOSIS — M25561 Pain in right knee: Secondary | ICD-10-CM | POA: Diagnosis not present

## 2016-07-02 DIAGNOSIS — M1711 Unilateral primary osteoarthritis, right knee: Secondary | ICD-10-CM | POA: Diagnosis not present

## 2016-07-02 DIAGNOSIS — G8929 Other chronic pain: Secondary | ICD-10-CM | POA: Diagnosis not present

## 2016-07-02 DIAGNOSIS — M7541 Impingement syndrome of right shoulder: Secondary | ICD-10-CM | POA: Diagnosis not present

## 2016-07-08 ENCOUNTER — Encounter: Payer: Self-pay | Admitting: Gastroenterology

## 2016-07-11 DIAGNOSIS — G8929 Other chronic pain: Secondary | ICD-10-CM | POA: Diagnosis not present

## 2016-07-11 DIAGNOSIS — M25561 Pain in right knee: Secondary | ICD-10-CM | POA: Diagnosis not present

## 2016-07-11 DIAGNOSIS — M7541 Impingement syndrome of right shoulder: Secondary | ICD-10-CM | POA: Diagnosis not present

## 2016-07-16 DIAGNOSIS — M7541 Impingement syndrome of right shoulder: Secondary | ICD-10-CM | POA: Diagnosis not present

## 2016-07-16 DIAGNOSIS — M1711 Unilateral primary osteoarthritis, right knee: Secondary | ICD-10-CM | POA: Diagnosis not present

## 2016-07-29 ENCOUNTER — Other Ambulatory Visit: Payer: Self-pay | Admitting: Family Medicine

## 2016-08-20 ENCOUNTER — Encounter: Payer: PPO | Admitting: Gastroenterology

## 2016-08-25 ENCOUNTER — Ambulatory Visit (AMBULATORY_SURGERY_CENTER): Payer: Self-pay

## 2016-08-25 ENCOUNTER — Encounter: Payer: Self-pay | Admitting: Gastroenterology

## 2016-08-25 VITALS — Ht 70.0 in | Wt 245.6 lb

## 2016-08-25 DIAGNOSIS — Z860100 Personal history of colon polyps, unspecified: Secondary | ICD-10-CM

## 2016-08-25 HISTORY — PX: COLONOSCOPY: SHX174

## 2016-08-25 MED ORDER — SUPREP BOWEL PREP KIT 17.5-3.13-1.6 GM/177ML PO SOLN: 1.0000 | Freq: Once | ORAL | 0 refills | Status: AC

## 2016-08-25 NOTE — Progress Notes (Signed)
No allergies to eggs or soy No past problems with anesthesia No diet meds No home oxygen  Registered emmi 

## 2016-09-03 ENCOUNTER — Encounter: Payer: Self-pay | Admitting: Gastroenterology

## 2016-09-03 ENCOUNTER — Ambulatory Visit (AMBULATORY_SURGERY_CENTER): Payer: PPO | Admitting: Gastroenterology

## 2016-09-03 VITALS — BP 144/65 | HR 77 | Temp 98.6°F | Resp 14 | Ht 70.0 in | Wt 245.0 lb

## 2016-09-03 DIAGNOSIS — D122 Benign neoplasm of ascending colon: Secondary | ICD-10-CM | POA: Diagnosis not present

## 2016-09-03 DIAGNOSIS — Z1211 Encounter for screening for malignant neoplasm of colon: Secondary | ICD-10-CM

## 2016-09-03 DIAGNOSIS — Z86010 Personal history of colonic polyps: Secondary | ICD-10-CM | POA: Diagnosis not present

## 2016-09-03 DIAGNOSIS — J45909 Unspecified asthma, uncomplicated: Secondary | ICD-10-CM | POA: Diagnosis not present

## 2016-09-03 DIAGNOSIS — Z1212 Encounter for screening for malignant neoplasm of rectum: Secondary | ICD-10-CM

## 2016-09-03 DIAGNOSIS — F419 Anxiety disorder, unspecified: Secondary | ICD-10-CM | POA: Diagnosis not present

## 2016-09-03 MED ORDER — SODIUM CHLORIDE 0.9 % IV SOLN: 500.0000 mL | INTRAVENOUS | Status: DC

## 2016-09-03 NOTE — Progress Notes (Signed)
To PACU, vss patent aw report to rn 

## 2016-09-03 NOTE — Patient Instructions (Signed)
YOU HAD AN ENDOSCOPIC PROCEDURE TODAY AT THE [facility]:   Refer to the procedure report that was given to you for any specific questions about what was found during the examination.  If the procedure report does not answer your questions, please call your gastroenterologist to clarify.  If you requested that your care partner not be given the details of your procedure findings, then the procedure report has been included in a sealed envelope for you to review at your convenience later.  YOU SHOULD EXPECT: Some feelings of bloating in the abdomen. Passage of more gas than usual.  Walking can help get rid of the air that was put into your GI tract during the procedure and reduce the bloating. If you had a lower endoscopy (such as a colonoscopy or flexible sigmoidoscopy) you may notice spotting of blood in your stool or on the toilet paper. If you underwent a bowel prep for your procedure, you may not have a normal bowel movement for a few days.  Please Note:  You might notice some irritation and congestion in your nose or some drainage.  This is from the oxygen used during your procedure.  There is no need for concern and it should clear up in a day or so.  SYMPTOMS TO REPORT IMMEDIATELY:   Following lower endoscopy (colonoscopy or flexible sigmoidoscopy):  Excessive amounts of blood in the stool  Significant tenderness or worsening of abdominal pains  Swelling of the abdomen that is new, acute  Fever of 100F or higher   For urgent or emergent issues, a gastroenterologist can be reached at any hour by calling [phone]. Please read all handouts given to you by your recovery nurse.   DIET:  We do recommend a small meal at first, but then you may proceed to your regular diet.  Drink plenty of fluids but you should avoid alcoholic beverages for 24 hours.  ACTIVITY:  You should plan to take it easy for the rest of today and you should NOT DRIVE or use heavy machinery until  tomorrow (because of the sedation medicines used during the test).    FOLLOW UP: Our staff will call the number listed on your records the next business day following your procedure to check on you and address any questions or concerns that you may have regarding the information given to you following your procedure. If we do not reach you, we will leave a message.  However, if you are feeling well and you are not experiencing any problems, there is no need to return our call.  We will assume that you have returned to your regular daily activities without incident.  If any biopsies were taken you will be contacted by phone or by letter within the next 1-3 weeks.  Please call us at [phone] if you have not heard about the biopsies in 3 weeks.    SIGNATURES/CONFIDENTIALITY: You and/or your care partner have signed paperwork which will be entered into your electronic medical record.  These signatures attest to the fact that that the information above on your After Visit Summary has been reviewed and is understood.  Full responsibility of the confidentiality of this discharge information lies with you and/or your care-partner.  Thank you for letting us take care of your healthcare needs today. 

## 2016-09-03 NOTE — Progress Notes (Signed)
No changes in medical or surgical hx since PV 

## 2016-09-03 NOTE — Op Note (Signed)
[place] Patient Name: [name] Procedure Date: 09/03/2016 8:02 AM MRN: [id number] Endoscopist: Ladene Artist , MD Age: 66 Referring MD:  Date of Birth: 04/12/1951 Gender: Male Account #: [id number] Procedure:                Colonoscopy Indications:              Screening for colorectal malignant neoplasm Medicines:                Monitored Anesthesia Care Procedure:                Pre-Anesthesia Assessment:                           - Prior to the procedure, a History and Physical                            was performed, and patient medications and                            allergies were reviewed. The patient's tolerance of                            previous anesthesia was also reviewed. The risks                            and benefits of the procedure and the sedation                            options and risks were discussed with the patient.                            All questions were answered, and informed consent                            was obtained. Prior Anticoagulants: The patient has                            taken no previous anticoagulant or antiplatelet                            agents. ASA Grade Assessment: II - A patient with                            mild systemic disease. After reviewing the risks                            and benefits, the patient was deemed in                            satisfactory condition to undergo the procedure.                           After obtaining informed consent, the colonoscope  was passed under direct vision. Throughout the                            procedure, the patient's blood pressure, pulse, and                            oxygen saturations were monitored continuously. The                            Colonoscope was introduced through the anus and                            advanced to the the cecum, identified by                            appendiceal orifice and  ileocecal valve. The                            ileocecal valve, appendiceal orifice, and rectum                            were photographed. The quality of the bowel                            preparation was excellent. The colonoscopy was                            performed without difficulty. The patient tolerated                            the procedure well. Scope In: 8:08:35 AM Scope Out: 8:20:34 AM Scope Withdrawal Time: 0 hours 9 minutes 28 seconds  Total Procedure Duration: 0 hours 11 minutes 59 seconds  Findings:                 The perianal and digital rectal examinations were                            normal.                           A 6 mm polyp was found in the ascending colon. The                            polyp was sessile. The polyp was removed with a                            cold snare. Resection and retrieval were complete.                           Scattered small-mouthed diverticula were found in                            the sigmoid colon, transverse colon and ascending  colon. There was no evidence of diverticular                            bleeding.                           The exam was otherwise without abnormality on                            direct and retroflexion views. Complications:            No immediate complications. Estimated blood loss:                            None. Estimated Blood Loss:     Estimated blood loss: none. Impression:               - One 6 mm polyp in the ascending colon, removed                            with a cold snare. Resected and retrieved.                           - Mild diverticulosis in the sigmoid colon, in the                            transverse colon and in the ascending colon. There                            was no evidence of diverticular bleeding.                           - The examination was otherwise normal on direct                            and retroflexion  views. Recommendation:           - Repeat colonoscopy in 5 years for surveillance if                            polyp is precancerous, otherwise 10 years.                           - Patient has a contact number available for                            emergencies. The signs and symptoms of potential                            delayed complications were discussed with the                            patient. Return to normal activities tomorrow.                            Written discharge instructions  were provided to the                            patient.                           - Resume previous diet.                           - Continue present medications.                           - Await pathology results. Ladene Artist, MD 09/03/2016 8:23:37 AM This report has been signed electronically.

## 2016-09-03 NOTE — Progress Notes (Signed)
Called to room to assist during endoscopic procedure.  Patient ID and intended procedure confirmed with present staff. Received instructions for my participation in the procedure from the performing physician.  

## 2016-09-04 ENCOUNTER — Telehealth: Payer: Self-pay | Admitting: *Deleted

## 2016-09-04 NOTE — Telephone Encounter (Signed)
  Follow up Call-  Call back number 09/03/2016  Post procedure Call Back phone  # [phone]  Permission to leave phone message Yes  Some recent data might be hidden     Patient questions:  Do you have a fever, pain , or abdominal swelling? No. Pain Score  0 *  Have you tolerated food without any problems? Yes.    Have you been able to return to your normal activities? Yes.    Do you have any questions about your discharge instructions: Diet   No. Medications  No. Follow up visit  No.  Do you have questions or concerns about your Care? No.  Actions: * If pain score is 4 or above: No action needed, pain <4.  Pt.doing ok.just had a sore throat from oxygen but is doing okay.

## 2016-09-08 ENCOUNTER — Encounter: Payer: Self-pay | Admitting: Family Medicine

## 2016-09-08 ENCOUNTER — Ambulatory Visit (INDEPENDENT_AMBULATORY_CARE_PROVIDER_SITE_OTHER): Payer: PPO | Admitting: Family Medicine

## 2016-09-08 VITALS — BP 126/82 | Temp 98.2°F | Wt 246.0 lb

## 2016-09-08 DIAGNOSIS — J301 Allergic rhinitis due to pollen: Secondary | ICD-10-CM | POA: Diagnosis not present

## 2016-09-08 DIAGNOSIS — M25511 Pain in right shoulder: Secondary | ICD-10-CM | POA: Diagnosis not present

## 2016-09-08 DIAGNOSIS — G8929 Other chronic pain: Secondary | ICD-10-CM | POA: Diagnosis not present

## 2016-09-08 DIAGNOSIS — M7541 Impingement syndrome of right shoulder: Secondary | ICD-10-CM | POA: Diagnosis not present

## 2016-09-08 MED ORDER — PREDNISONE 20 MG PO TABS: ORAL_TABLET | ORAL | 1 refills | Status: DC

## 2016-09-08 MED ORDER — ALBUTEROL SULFATE HFA 108 (90 BASE) MCG/ACT IN AERS: 2.0000 | INHALATION_SPRAY | Freq: Four times a day (QID) | RESPIRATORY_TRACT | 1 refills | Status: DC | PRN

## 2016-09-08 NOTE — Patient Instructions (Signed)
Prednisone 20 mg,,,,,,,,,,,,,,,,,,, 2 tabs 3 days taper as outlined  Albuterol,,,,,,,,,,,,,, 2+3 times a day until the cough stops  Return when necessary

## 2016-09-08 NOTE — Progress Notes (Signed)
Pre visit review using our clinic review tool, if applicable. No additional management support is needed unless otherwise documented below in the visit note. 

## 2016-09-08 NOTE — Progress Notes (Signed)
[name] is a 66 year old male nonsmoker who comes in today for evaluation of a cough  About 10 days ago he began mowing his grass and after one episode of grass mowing developed a cough. It hasn't gone away. He's had a history of asthma in the past when he is exposed to pollen.  Review of systems otherwise negative  BP 126/82 (BP Location: Left Arm)   Temp 98.2 F (36.8 C) (Oral)   Wt 246 lb (111.6 kg)   BMI 35.30 kg/m  Examination HEENT were negative neck was supple thyroid not enlarged no carotid bruits cardiopulmonary exam normal except for some mild wheezing on forced expiration  #1 asthma...............Marland Kitchen prednisone burst and taper along with albuterol 2 puffs 3 times a day when necessary

## 2016-09-16 ENCOUNTER — Encounter: Payer: Self-pay | Admitting: Gastroenterology

## 2016-09-17 NOTE — Progress Notes (Signed)
Please place orders in epic for 6-6- surgery

## 2016-09-22 DIAGNOSIS — S61303A Unspecified open wound of left middle finger with damage to nail, initial encounter: Secondary | ICD-10-CM | POA: Diagnosis not present

## 2016-09-22 DIAGNOSIS — S61213A Laceration without foreign body of left middle finger without damage to nail, initial encounter: Secondary | ICD-10-CM | POA: Diagnosis not present

## 2016-09-22 DIAGNOSIS — Z881 Allergy status to other antibiotic agents status: Secondary | ICD-10-CM | POA: Diagnosis not present

## 2016-09-22 DIAGNOSIS — Z23 Encounter for immunization: Secondary | ICD-10-CM | POA: Diagnosis not present

## 2016-09-22 DIAGNOSIS — S62633A Displaced fracture of distal phalanx of left middle finger, initial encounter for closed fracture: Secondary | ICD-10-CM | POA: Diagnosis not present

## 2016-09-22 DIAGNOSIS — M7989 Other specified soft tissue disorders: Secondary | ICD-10-CM | POA: Diagnosis not present

## 2016-09-22 DIAGNOSIS — S67193A Crushing injury of left middle finger, initial encounter: Secondary | ICD-10-CM | POA: Diagnosis not present

## 2016-09-22 DIAGNOSIS — S6992XA Unspecified injury of left wrist, hand and finger(s), initial encounter: Secondary | ICD-10-CM | POA: Diagnosis not present

## 2016-09-22 DIAGNOSIS — Z7952 Long term (current) use of systemic steroids: Secondary | ICD-10-CM | POA: Diagnosis not present

## 2016-09-22 DIAGNOSIS — S62633B Displaced fracture of distal phalanx of left middle finger, initial encounter for open fracture: Secondary | ICD-10-CM | POA: Diagnosis not present

## 2016-09-22 DIAGNOSIS — W230XXA Caught, crushed, jammed, or pinched between moving objects, initial encounter: Secondary | ICD-10-CM | POA: Diagnosis not present

## 2016-09-22 NOTE — Patient Instructions (Addendum)
Rodarius Kichline Horkey  09/22/2016   Your procedure is scheduled on: 09/30/2016    Report to [facility] Main  Entrance Take [place]  elevators to 3rd floor to  [place] at   1130 AM.    Call this number if you have problems the morning of surgery [phone]    Remember: ONLY 1 PERSON MAY GO WITH YOU TO SHORT STAY TO GET  READY MORNING OF [place].  Do not eat food after midnite, May have clear liquids from 12 midnite until 0700am then nothing by mouth.       Take these medicines the morning of surgery with A SIP OF WATER: Albuterol Inhaler if needed and bring                                 You may not have any metal on your body including hair pins and              piercings  Do not wear jewelry,  lotions, powders or perfumes, deodorant                          Men may shave face and neck.   Do not bring valuables to the hospital. [place].  Contacts, dentures or bridgework may not be worn into surgery.  Leave suitcase in the car. After surgery it may be brought to your room.                   Please read over the following fact sheets you were given: _____________________________________________________________________             [facility] - Preparing for Surgery Before surgery, you can play an important role.  Because skin is not sterile, your skin needs to be as free of germs as possible.  You can reduce the number of germs on your skin by washing with CHG (chlorahexidine gluconate) soap before surgery.  CHG is an antiseptic cleaner which kills germs and bonds with the skin to continue killing germs even after washing. Please DO NOT use if you have an allergy to CHG or antibacterial soaps.  If your skin becomes reddened/irritated stop using the CHG and inform your nurse when you arrive at Short Stay. Do not shave (including legs and underarms) for at least 48 hours prior to the first CHG  shower.  You may shave your face/neck. Please follow these instructions carefully:  1.  Shower with CHG Soap the night before surgery and the  morning of Surgery.  2.  If you choose to wash your hair, wash your hair first as usual with your  normal  shampoo.  3.  After you shampoo, rinse your hair and body thoroughly to remove the  shampoo.                           4.  Use CHG as you would any other liquid soap.  You can apply chg directly  to the skin and wash                       Gently with a  scrungie or clean washcloth.  5.  Apply the CHG Soap to your body ONLY FROM THE NECK DOWN.   Do not use on face/ open                           Wound or open sores. Avoid contact with eyes, ears mouth and genitals (private parts).                       Wash face,  Genitals (private parts) with your normal soap.             6.  Wash thoroughly, paying special attention to the area where your surgery  will be performed.  7.  Thoroughly rinse your body with warm water from the neck down.  8.  DO NOT shower/wash with your normal soap after using and rinsing off  the CHG Soap.                9.  Pat yourself dry with a clean towel.            10.  Wear clean pajamas.            11.  Place clean sheets on your bed the night of your first shower and do not  sleep with pets. Day of Surgery : Do not apply any lotions/deodorants the morning of surgery.  Please wear clean clothes to the hospital/surgery center.  FAILURE TO FOLLOW THESE INSTRUCTIONS MAY RESULT IN THE CANCELLATION OF YOUR SURGERY PATIENT SIGNATURE_________________________________  NURSE SIGNATURE__________________________________  ________________________________________________________________________    CLEAR LIQUID DIET   Foods Allowed                                                                     Foods Excluded  Coffee and tea, regular and decaf                             liquids that you cannot  Plain Jell-O in any flavor                                              see through such as: Fruit ices (not with fruit pulp)                                     milk, soups, orange juice  Iced Popsicles                                    All solid food Carbonated beverages, regular and diet                                    Cranberry, grape and apple juices Sports drinks like Gatorade Lightly seasoned clear broth or consume(fat free) Sugar, honey syrup  Sample Menu Breakfast  Lunch                                     Supper Cranberry juice                    Beef broth                            Chicken broth Jell-O                                     Grape juice                           Apple juice Coffee or tea                        Jell-O                                      Popsicle                                                Coffee or tea                        Coffee or tea  _____________________________________________________________________    Incentive Spirometer  An incentive spirometer is a tool that can help keep your lungs clear and active. This tool measures how well you are filling your lungs with each breath. Taking long deep breaths may help reverse or decrease the chance of developing breathing (pulmonary) problems (especially infection) following:  A long period of time when you are unable to move or be active. BEFORE THE PROCEDURE   If the spirometer includes an indicator to show your best effort, your nurse or respiratory therapist will set it to a desired goal.  If possible, sit up straight or lean slightly forward. Try not to slouch.  Hold the incentive spirometer in an upright position. INSTRUCTIONS FOR USE  1. Sit on the edge of your bed if possible, or sit up as far as you can in bed or on a chair. 2. Hold the incentive spirometer in an upright position. 3. Breathe out normally. 4. Place the mouthpiece in your mouth and seal your lips tightly around  it. 5. Breathe in slowly and as deeply as possible, raising the piston or the ball toward the top of the column. 6. Hold your breath for 3-5 seconds or for as long as possible. Allow the piston or ball to fall to the bottom of the column. 7. Remove the mouthpiece from your mouth and breathe out normally. 8. Rest for a few seconds and repeat Steps 1 through 7 at least 10 times every 1-2 hours when you are awake. Take your time and take a few normal breaths between deep breaths. 9. The spirometer may include an indicator to show your best effort. Use the indicator as a goal to work toward during each repetition. 10. After each set of 10 deep breaths,  practice coughing to be sure your lungs are clear. If you have an incision (the cut made at the time of surgery), support your incision when coughing by placing a pillow or rolled up towels firmly against it. Once you are able to get out of bed, walk around indoors and cough well. You may stop using the incentive spirometer when instructed by your caregiver.  RISKS AND COMPLICATIONS  Take your time so you do not get dizzy or light-headed.  If you are in pain, you may need to take or ask for pain medication before doing incentive spirometry. It is harder to take a deep breath if you are having pain. AFTER USE  Rest and breathe slowly and easily.  It can be helpful to keep track of a log of your progress. Your caregiver can provide you with a simple table to help with this. If you are using the spirometer at home, follow these instructions: [place] IF:   You are having difficultly using the spirometer.  You have trouble using the spirometer as often as instructed.  Your pain medication is not giving enough relief while using the spirometer.  You develop fever of 100.5 F (38.1 C) or higher. SEEK IMMEDIATE MEDICAL CARE IF:   You cough up bloody sputum that had not been present before.  You develop fever of 102 F (38.9 C) or  greater.  You develop worsening pain at or near the incision site. MAKE SURE YOU:   Understand these instructions.  Will watch your condition.  Will get help right away if you are not doing well or get worse. Document Released: 08/24/2006 Document Revised: 07/06/2011 Document Reviewed: 10/25/2006 [facility] Patient Information 2014 [place], [place].   ________________________________________________________________________

## 2016-09-22 NOTE — Progress Notes (Signed)
Please place orders in EPIC as patient has a pre-op appointment 09/24/2016! Thank you!

## 2016-09-23 DIAGNOSIS — M25572 Pain in left ankle and joints of left foot: Secondary | ICD-10-CM | POA: Diagnosis not present

## 2016-09-23 DIAGNOSIS — S9032XA Contusion of left foot, initial encounter: Secondary | ICD-10-CM | POA: Diagnosis not present

## 2016-09-23 NOTE — Progress Notes (Signed)
Please place orders in EPIC as patient has a pre-op appointment on 09/24/2016! Thank you!

## 2016-09-24 ENCOUNTER — Encounter: Payer: Self-pay | Admitting: Family Medicine

## 2016-09-24 ENCOUNTER — Encounter (HOSPITAL_COMMUNITY): Payer: Self-pay

## 2016-09-24 ENCOUNTER — Encounter (HOSPITAL_COMMUNITY)
Admission: RE | Admit: 2016-09-24 | Discharge: 2016-09-24 | Disposition: A | Discharge status: Home / Self Care | Payer: PPO | Source: Ambulatory Visit | Attending: Orthopedic Surgery | Admitting: Orthopedic Surgery

## 2016-09-24 ENCOUNTER — Ambulatory Visit (INDEPENDENT_AMBULATORY_CARE_PROVIDER_SITE_OTHER): Payer: PPO | Admitting: Family Medicine

## 2016-09-24 VITALS — BP 128/82 | HR 98 | Resp 12 | Ht 70.0 in

## 2016-09-24 DIAGNOSIS — Z01812 Encounter for preprocedural laboratory examination: Secondary | ICD-10-CM | POA: Diagnosis not present

## 2016-09-24 DIAGNOSIS — L739 Follicular disorder, unspecified: Secondary | ICD-10-CM | POA: Diagnosis not present

## 2016-09-24 DIAGNOSIS — M7591 Shoulder lesion, unspecified, right shoulder: Secondary | ICD-10-CM | POA: Insufficient documentation

## 2016-09-24 DIAGNOSIS — B354 Tinea corporis: Secondary | ICD-10-CM | POA: Diagnosis not present

## 2016-09-24 HISTORY — DX: Rash and other nonspecific skin eruption: R21

## 2016-09-24 HISTORY — DX: Unspecified injury of left ankle, initial encounter: S99.912A

## 2016-09-24 HISTORY — DX: Personal history of urinary calculi: Z87.442

## 2016-09-24 HISTORY — DX: Pneumonia, unspecified organism: J18.9

## 2016-09-24 HISTORY — DX: Unspecified open wound of unspecified finger without damage to nail, initial encounter: S61.209A

## 2016-09-24 LAB — CBC
HCT: 42.6 % (ref 39.0–52.0)
Hemoglobin: 14.8 g/dL (ref 13.0–17.0)
MCH: 33.9 pg (ref 26.0–34.0)
MCHC: 34.7 g/dL (ref 30.0–36.0)
MCV: 97.5 fL (ref 78.0–100.0)
Platelets: 200 10*3/uL (ref 150–400)
RBC: 4.37 MIL/uL (ref 4.22–5.81)
RDW: 12.8 % (ref 11.5–15.5)
WBC: 13.3 10*3/uL — ABNORMAL HIGH (ref 4.0–10.5)

## 2016-09-24 LAB — ABO/RH: ABO/RH(D): A NEG

## 2016-09-24 MED ORDER — FLUCONAZOLE 150 MG PO TABS: 150.0000 mg | ORAL_TABLET | Freq: Once | ORAL | 0 refills | Status: AC

## 2016-09-24 MED ORDER — DOXYCYCLINE HYCLATE 100 MG PO TABS: 100.0000 mg | ORAL_TABLET | Freq: Two times a day (BID) | ORAL | 0 refills | Status: AC

## 2016-09-24 MED ORDER — KETOCONAZOLE 2 % EX CREA: 1.0000 "application " | TOPICAL_CREAM | Freq: Every day | CUTANEOUS | 0 refills | Status: DC

## 2016-09-24 NOTE — Patient Instructions (Signed)
  [name] I have seen you today for an acute visit.  A few things to remember from today's visit:   Tinea corporis - Plan: fluconazole (DIFLUCAN) 150 MG tablet, ketoconazole (NIZORAL) 2 % cream  Folliculitis - Plan: doxycycline (VIBRA-TABS) 100 MG tablet    Medications prescribed today are intended for short period of time and will not be refill upon request, a follow up appointment might be necessary to discuss continuation of of treatment if appropriate.     In general please monitor for signs of worsening symptoms and seek immediate medical attention if any concerning.  If symptoms are not resolved in 2-3 weeks you should schedule a follow up appointment with your doctor, before if needed.

## 2016-09-24 NOTE — Progress Notes (Addendum)
LVMM for [employer] and made her aware of trauma to left middle index finger with stitches and patient on antibiotics and pain meds along with rash to right leg extending from above knee to halfway of calf.  Patient to be seen today by Dr Emogene Morgan Daws at [facility] for followup with finger and to evaluate rash on right leg,  Daughter with patient and patient and daughter aware to keep Dr Gladstone Lighter aware of rash and progress of right index finger.. Also informed Velvet McBride that white count elevated at 13.3 on preop labs today.

## 2016-09-24 NOTE — Progress Notes (Signed)
HPI:   Adam VISIT:  Chief Complaint  Patient presents with  . rash on Ellis    [name] is a 66 y.o. male, who is here today with his wife complaining of erythematous rash around right Ellis. According to patient, he has had these lesions for "a while" but because he is planning on having right shoulder surgery, he was recommended to have this check before having procedure done.  Rash seems to be stable, some lesions have resolved spontaneously, it is not tender or pruritic, and he has not noted drainage.  He is currently on oral Prednisone, states that he has been on this medication intermittently for years. Currently he is also taking oral antibiotics, Keflex, started 09/22/16 because crush injury left middle finger.    Rash  This is a recurrent problem. The current episode started more than 1 month ago. The problem is unchanged. The affected locations include the right lower leg. Pertinent negatives include no congestion, fatigue, fever, rhinorrhea, shortness of breath, sore throat or vomiting. Past treatments include nothing.  He is not aware of insect/tick bite and no sick contact.  He has not tried OTC treatment for rash.  Review of Systems  Constitutional: Negative for chills, fatigue and fever.  HENT: Negative for congestion, mouth sores, rhinorrhea and sore throat.   Eyes: Negative for redness and itching.  Respiratory: Negative for chest tightness, shortness of breath and wheezing.   Cardiovascular: Negative for leg swelling.  Gastrointestinal: Negative for abdominal pain, nausea and vomiting.  Musculoskeletal: Negative for myalgias.  Skin: Positive for rash. Negative for wound.  Allergic/Immunologic: Negative for environmental allergies and food allergies.  Neurological: Negative for weakness and numbness.      Current Outpatient Prescriptions on File Prior to Visit  Medication Sig Dispense Refill  . albuterol (PROVENTIL HFA;VENTOLIN HFA) 108 (90 Base)  MCG/ACT inhaler Inhale 2 puffs into the lungs every 6 (six) hours as needed for wheezing or shortness of breath. 1 Inhaler 1  . COCONUT OIL PO Take 1 capsule by mouth daily.     . Cyanocobalamin (VITAMIN B-12) 5000 MCG TBDP Take 5,000 mcg by mouth daily.    Marland Kitchen oxyCODONE-acetaminophen (PERCOCET/ROXICET) 5-325 MG tablet Take by mouth every 4 (four) hours as needed for severe pain.    . predniSONE (DELTASONE) 10 MG tablet TAKE 1 TABLET BY MOUTH MONDAY, WEDNESDAY AND FRIDAY 100 tablet 0  . predniSONE (DELTASONE) 20 MG tablet 2 tabs x 3 days, 1 tab x 3 days, 1/2 tab x 3 days, 1/2 tab M,W,F x 2 weeks (Patient taking differently: Take 20 mg by mouth See admin instructions. 2 tabs x 3 days, 1 tab x 3 days, 1/2 tab x 3 days, 1/2 tab M,W,F x 2 weeks) 40 tablet 1   Current Facility-Administered Medications on File Prior to Visit  Medication Dose Route Frequency Provider Last Rate Last Dose  . 0.9 %  sodium chloride infusion  500 mL Intravenous Continuous Ladene Artist, MD         Past Medical History:  Diagnosis Date  . Anxiety   . Arthritis   . Asthma   . At risk for sleep apnea    STOP-BANG = 4  SENT TO PCP 05-24-2013  . Eczema   . Edema of foot   . Finger wound, simple, open    left middle index finger - stitches and dressing occured on 09/22/2016- followed by Dr Daws at [facility]   . H/O hiatal hernia   .  History of kidney stones   . HOH (hard of hearing)   . Hyperlipidemia   . Influenza A   . Influenza B    symptoms started 05-16-2013/  dx 05-17-2013 by pcp  . Left ankle injury    twisted left ankle   . Left ureteral calculus   . Pneumonia    hx of   . Rash    right leg- calf -   . Wears dentures    bottom   Allergies  Allergen Reactions  . Sesame Oil Itching and Swelling  . Tobramycin Itching and Swelling    Used in an eye oint-reaction    Social History   Social History  . Marital status: Married    Spouse name: N/A  . Number of children: 2  . Years of  education: N/A   Occupational History  . [employer]    Social History Main Topics  . Smoking status: Never Smoker  . Smokeless tobacco: Never Used  . Alcohol use 4.2 oz/week    7 Cans of beer per week     Comment: beer- 2 beers day sometimes   . Drug use: No  . Sexual activity: Not Asked   Other Topics Concern  . None   Social History Narrative  . None    Vitals:   09/24/16 1533  BP: 128/82  Pulse: 98  Resp: 12   Body mass index is 35.3 kg/m.  Physical Exam  Nursing note and vitals reviewed. Constitutional: He appears well-developed. No distress.  HENT:  Head: Atraumatic.  Mouth/Throat: Oropharynx is clear and moist and mucous membranes are normal.  Eyes: Conjunctivae are normal.  Respiratory: Effort normal and breath sounds normal. No respiratory distress.  Musculoskeletal: He exhibits no edema or tenderness.       Right Ellis: He exhibits no swelling. No tenderness found.  Lymphadenopathy:    He has no cervical adenopathy.  Skin: Skin is warm. Rash noted. Rash is macular and pustular. Rash is not vesicular. There is erythema.     Medial aspect of right Ellis extending to proximal medial and pretibial area erythematous macular rash with clear center, mildly scaly, some are confluent. A few scattered lesions on lateral proximal aspect of calf. Lesions are not tender,no local heat or induration. There are also micro-pustular lesion scattered on lateral upper aspect of calf and a few on medial aspect of right Ellis. No tender,dry, no local heat or induration.  Psychiatric: He has a normal mood and affect.  Well groomed, good eye contact.     ASSESSMENT AND PLAN:   Reo was seen today for rash on Ellis.  Diagnoses and all orders for this visit:  Tinea corporis  We discussed Dx and treatment options, I recommended topical treatment and one time oral Diflucan. He could repeat Diflucan 150 mg in a week if no significant improvement.  F/U with PCP  as needed.  -     fluconazole (DIFLUCAN) 150 MG tablet; Take 1 tablet (150 mg total) by mouth once. -     ketoconazole (NIZORAL) 2 % cream; Apply 1 application topically daily.  Folliculitis  There are a few pustular lesions, which could be due to some scratching. Instructed to avoid scratching area. Recommend changing Keflex for Doxycycline. Keep area clear. F/U as needed.  -     doxycycline (VIBRA-TABS) 100 MG tablet; Take 1 tablet (100 mg total) by mouth 2 (two) times daily.       -Mr.Fedora Bonito advised  to seek immediate medical attention is symptoms suddenly get worse or to follow if symptoms persist or new concerns arise.       Lealon Vanputten G. Martinique, MD  [facility]. [place] office.

## 2016-09-24 NOTE — Progress Notes (Signed)
CBC done 09/24/16 faxed via epic to Dr Gladstone Lighter.

## 2016-09-25 ENCOUNTER — Encounter: Payer: Self-pay | Admitting: Family Medicine

## 2016-09-25 ENCOUNTER — Telehealth: Payer: Self-pay | Admitting: Family Medicine

## 2016-09-25 ENCOUNTER — Ambulatory Visit (INDEPENDENT_AMBULATORY_CARE_PROVIDER_SITE_OTHER): Payer: PPO | Admitting: Family Medicine

## 2016-09-25 VITALS — BP 142/80 | HR 97 | Temp 99.1°F | Resp 16 | Ht 70.0 in

## 2016-09-25 DIAGNOSIS — R030 Elevated blood-pressure reading, without diagnosis of hypertension: Secondary | ICD-10-CM | POA: Diagnosis not present

## 2016-09-25 DIAGNOSIS — J45901 Unspecified asthma with (acute) exacerbation: Secondary | ICD-10-CM

## 2016-09-25 MED ORDER — IPRATROPIUM-ALBUTEROL 0.5-2.5 (3) MG/3ML IN SOLN
3.0000 mL | Freq: Once | RESPIRATORY_TRACT | Status: AC
  Administered 2016-09-25: 3 mL via RESPIRATORY_TRACT

## 2016-09-25 NOTE — Telephone Encounter (Signed)
Patient Name: [name] DOB: [birth year] Initial Comment Caller states he is to have ortho surgey Wednesday. He was seen for rash yesterday, rx ketoconazole, flucatisone, and doxycycline. Now has cough and mucus. Nurse Assessment Nurse: Erling Cruz, RN, Morey Hummingbird Date/Time (Eastern Time): 09/25/2016 9:11:59 AM Confirm and document reason for call. If symptomatic, describe symptoms. ---Caller states he is to have ortho (rotator cuff) surgery Thursday. He was seen for rash (had for a couple of weeks) yesterday, rx ketoconazole, flucticsone, and doxycycline. Now has cough and mucus. No fever. Hx of asthma & PNA. Was in ED for smashed finger 3 nights ago & given another abx for that but stopped it to take the abx given yesterday. 4 weeks ago given Prednisone for respiratory problems after having O2 during colonoscopy. Does the patient have any new or worsening symptoms? ---Yes Will a triage be completed? ---Yes Related visit to physician within the last 2 weeks? ---Yes Does the PT have any chronic conditions? (i.e. diabetes, asthma, etc.) ---Yes List chronic conditions. ---Asthma Is this a behavioral health or substance abuse call? ---No Guidelines Guideline Title Affirmed Question Affirmed Notes Asthma Attack [1] Continuous (nonstop) coughing AND [2] keeps from working or sleeping AND [3] not improved after 2 nebulizer or inhaler treatments given 20 minutes apart Final Disposition User See Physician within 4 Hours (or PCP triage) Love, RN, Morey Hummingbird Comments Appt scheduled w/ Dr. Martinique for 11am today. Referrals REFERRED TO PCP OFFICE Disagree/Comply: Comply

## 2016-09-25 NOTE — Progress Notes (Signed)
HPI:   ACUTE VISIT:  Chief Complaint  Patient presents with  . Asthma  . Cough    [name] is a 66 y.o. male, who is here today complaining of asthma symptoms. I saw him yesterday because skin rash, started on Diflucan and Doxycycline among some. States that he woke up with cough and wheezing last night. He states that he took Diflucan last night before going to bed, wonders if this medication caused exacerbation. He denies skin rash, facial edema, oral edema,nasuea,vomiting,diarreha,or abdominal pain.  He reports improvement of rash today.  He has Hx of asthma, last exacerbation 3 weeks ago. He used Albuterol 2 puff about 3 hours ago.  Cough  This is a new problem. The current episode started yesterday. The problem has been gradually worsening. The cough is productive of sputum. Associated symptoms include nasal congestion, rhinorrhea, shortness of breath and wheezing. Pertinent negatives include no chest pain, chills, ear congestion, ear pain, eye redness, fever, headaches, heartburn, hemoptysis, myalgias, postnasal drip, rash, sore throat or sweats. Nothing aggravates the symptoms. He has tried a beta-agonist inhaler for the symptoms. The treatment provided mild relief. His past medical history is significant for asthma and environmental allergies.   Productive cough with yellowish/clear sputum.  Denies sick contact or recent travel. No Hx of tobacco use.  -BP mildly elevated today, no Hx of HTN. Denies severe/frequent headache, visual changes, palpitation, claudication, focal weakness, or edema.  He is on daily Prednisone 20 mg, taper started 09/08/16 for acute allergic rhinitis and cough.   Review of Systems  Constitutional: Negative for chills and fever.  HENT: Positive for rhinorrhea. Negative for congestion, ear pain, facial swelling, postnasal drip, sinus pain, sore throat, trouble swallowing and voice change.   Eyes: Negative for discharge, redness and  visual disturbance.  Respiratory: Positive for cough, shortness of breath and wheezing. Negative for hemoptysis.   Cardiovascular: Negative for chest pain, palpitations and leg swelling.  Gastrointestinal: Negative for abdominal pain, diarrhea, heartburn, nausea and vomiting.  Musculoskeletal: Negative for gait problem and myalgias.  Skin: Negative for pallor and rash.  Allergic/Immunologic: Positive for environmental allergies.  Neurological: Negative for syncope, weakness and headaches.  Hematological: Negative for adenopathy. Does not bruise/bleed easily.  Psychiatric/Behavioral: Negative for confusion. The patient is nervous/anxious.       Current Outpatient Prescriptions on File Prior to Visit  Medication Sig Dispense Refill  . albuterol (PROVENTIL HFA;VENTOLIN HFA) 108 (90 Base) MCG/ACT inhaler Inhale 2 puffs into the lungs every 6 (six) hours as needed for wheezing or shortness of breath. 1 Inhaler 1  . COCONUT OIL PO Take 1 capsule by mouth daily.     . Cyanocobalamin (VITAMIN B-12) 5000 MCG TBDP Take 5,000 mcg by mouth daily.    Marland Kitchen doxycycline (VIBRA-TABS) 100 MG tablet Take 1 tablet (100 mg total) by mouth 2 (two) times daily. 14 tablet 0  . ketoconazole (NIZORAL) 2 % cream Apply 1 application topically daily. 45 g 0  . oxyCODONE-acetaminophen (PERCOCET/ROXICET) 5-325 MG tablet Take by mouth every 4 (four) hours as needed for severe pain.    . predniSONE (DELTASONE) 20 MG tablet 2 tabs x 3 days, 1 tab x 3 days, 1/2 tab x 3 days, 1/2 tab M,W,F x 2 weeks (Patient taking differently: Take 20 mg by mouth See admin instructions. 2 tabs x 3 days, 1 tab x 3 days, 1/2 tab x 3 days, 1/2 tab M,W,F x 2 weeks) 40 tablet 1   Current Facility-Administered  Medications on File Prior to Visit  Medication Dose Route Frequency Provider Last Rate Last Dose  . 0.9 %  sodium chloride infusion  500 mL Intravenous Continuous Ladene Artist, MD         Past Medical History:  Diagnosis Date  .  Anxiety   . Arthritis   . Asthma   . At risk for sleep apnea    STOP-BANG = 4  SENT TO PCP 05-24-2013  . Eczema   . Edema of foot   . Finger wound, simple, open    left middle index finger - stitches and dressing occured on 09/22/2016- followed [facility] Dr Daws at [facility]   . H/O hiatal hernia   . History of kidney stones   . HOH (hard of hearing)   . Hyperlipidemia   . Influenza A   . Influenza B    symptoms started 05-16-2013/  dx 05-17-2013 by pcp  . Left ankle injury    twisted left ankle   . Left ureteral calculus   . Pneumonia    hx of   . Rash    right leg- calf -   . Wears dentures    bottom   Allergies  Allergen Reactions  . Sesame Oil Itching and Swelling  . Tobramycin Itching and Swelling    Used in an eye oint-reaction    Social History   Social History  . Marital status: Married    Spouse name: N/A  . Number of children: 2  . Years of education: N/A   Occupational History  . [employer]    Social History Main Topics  . Smoking status: Never Smoker  . Smokeless tobacco: Never Used  . Alcohol use 4.2 oz/week    7 Cans of beer per week     Comment: beer- 2 beers day sometimes   . Drug use: No  . Sexual activity: Not Asked   Other Topics Concern  . None   Social History Narrative  . None    Vitals:   09/25/16 1044  BP: (!) 142/80  Pulse: 97  Resp: 16  Temp: 99.1 F (37.3 C)   There is no height or weight on file to calculate BMI.   Physical Exam  Nursing note and vitals reviewed. Constitutional: He is oriented to person, place, and time. He appears well-developed. He does not appear ill. No distress.  HENT:  Head: Atraumatic.  Nose: Rhinorrhea present. Right sinus exhibits no maxillary sinus tenderness and no frontal sinus tenderness. Left sinus exhibits no maxillary sinus tenderness and no frontal sinus tenderness.  Mouth/Throat: Oropharynx is clear and moist and mucous membranes are normal.  Post nasal  drainage. Nasal voice.  Eyes: Conjunctivae and EOM are normal.  Cardiovascular: Normal rate and regular rhythm.   No murmur heard. Respiratory: Effort normal. No stridor. No respiratory distress. He has wheezes. He has no rales.  Lymphadenopathy:    He has no cervical adenopathy.  Neurological: He is alert and oriented to person, place, and time. He has normal strength.  Skin: Skin is warm. Rash noted. No erythema.  Psychiatric: His mood appears anxious.  Fairly groomed, good eye contact.    ASSESSMENT AND PLAN:    Deen was seen today for asthma and cough.  Diagnoses and all orders for this visit:  Moderate asthma with exacerbation, unspecified whether persistent  Explained that it is unlikely related to Diflucan.  Tolerated Duoneb well, wheezing improved. Continue Prednisone taper. He is on Doxycycline ,  started yesterday. No rales on lung auscultation but because he is planning on having shoulder surgery next week, CXR ordered. Albuterol inh 2 puff every 6 hours for a week then as needed for wheezing or shortness of breath.  Instructed about warning signs. F/U in 1 week if needed.  -     ipratropium-albuterol (DUONEB) 0.5-2.5 (3) MG/3ML nebulizer solution 3 mL; Take 3 mLs by nebulization once. -     DG Chest 2 View; Future  Elevated blood pressure reading  Mildly elevated. Monitor BP at home if possible. Most BP in the past < 140/90.    -Mr.Loleta Chance Fadness advised to seek immediate medical attention is symptoms suddenly get worse or to follow if symptoms persist or new concerns arise.       Ayren Zumbro G. Martinique, MD  [facility]. [place] office.

## 2016-09-25 NOTE — Patient Instructions (Addendum)
A few things to remember from today's visit:   Moderate asthma with exacerbation, unspecified whether persistent - Plan: ipratropium-albuterol (DUONEB) 0.5-2.5 (3) MG/3ML nebulizer solution 3 mL, DG Chest 2 View  Albuterol inh 2 puff every 6 hours for a week then as needed for wheezing or shortness of breath.  Continue Doxycycline and Prednisone.  Today X ray was ordered.  This can be done at [facility] at [facility] between 8 am and 5 pm: [place]. [phone].   Please be sure medication list is accurate. If a new problem present, please set up appointment sooner than planned today.

## 2016-09-25 NOTE — Telephone Encounter (Signed)
Noted  

## 2016-09-28 ENCOUNTER — Ambulatory Visit (INDEPENDENT_AMBULATORY_CARE_PROVIDER_SITE_OTHER)
Admission: RE | Admit: 2016-09-28 | Discharge: 2016-09-28 | Disposition: A | Discharge status: Home / Self Care | Payer: PPO | Source: Ambulatory Visit | Attending: Family Medicine | Admitting: Family Medicine

## 2016-09-28 DIAGNOSIS — J45901 Unspecified asthma with (acute) exacerbation: Secondary | ICD-10-CM

## 2016-09-28 DIAGNOSIS — R918 Other nonspecific abnormal finding of lung field: Secondary | ICD-10-CM | POA: Diagnosis not present

## 2016-09-30 ENCOUNTER — Encounter (HOSPITAL_COMMUNITY): Admission: RE | Payer: Self-pay | Source: Ambulatory Visit

## 2016-09-30 ENCOUNTER — Ambulatory Visit (HOSPITAL_COMMUNITY): Admission: RE | Admit: 2016-09-30 | Payer: PPO | Source: Ambulatory Visit | Admitting: Orthopedic Surgery

## 2016-09-30 LAB — TYPE AND SCREEN
ABO/RH(D): A NEG
Antibody Screen: NEGATIVE

## 2016-09-30 SURGERY — REPAIR, ROTATOR CUFF, OPEN
Anesthesia: General | Laterality: Right

## 2016-10-05 ENCOUNTER — Ambulatory Visit (INDEPENDENT_AMBULATORY_CARE_PROVIDER_SITE_OTHER): Payer: PPO | Admitting: Family Medicine

## 2016-10-05 ENCOUNTER — Encounter: Payer: Self-pay | Admitting: Family Medicine

## 2016-10-05 VITALS — BP 140/94 | HR 90 | Temp 98.3°F | Ht 70.0 in | Wt 244.0 lb

## 2016-10-05 DIAGNOSIS — J209 Acute bronchitis, unspecified: Secondary | ICD-10-CM

## 2016-10-05 MED ORDER — LEVOFLOXACIN 500 MG PO TABS: 500.0000 mg | ORAL_TABLET | Freq: Every day | ORAL | 0 refills | Status: AC

## 2016-10-05 MED ORDER — HYDROCOD POLST-CPM POLST ER 10-8 MG/5ML PO SUER: 5.0000 mL | Freq: Two times a day (BID) | ORAL | 0 refills | Status: DC | PRN

## 2016-10-05 NOTE — Patient Instructions (Signed)
WE NOW OFFER   [name] FAST TRACK!!!  SAME DAY Appointments for ACUTE CARE  Such as: Sprains, Injuries, cuts, abrasions, rashes, muscle pain, joint pain, back pain Colds, flu, sore throats, headache, allergies, cough, fever  Ear pain, sinus and eye infections Abdominal pain, nausea, vomiting, diarrhea, upset stomach Animal/insect bites  3 Easy Ways to Schedule: Walk-In Scheduling Call in scheduling Mychart Sign-up: [email or web address]         

## 2016-10-05 NOTE — Progress Notes (Signed)
   Subjective:    Patient ID: JOFFREY KERCE, male    DOB: December 04, 1950, 66 y.o.   MRN: [id number]  HPI Here for several weeks of chest congestion, wheezing, and coughing up yellow sputum. No fever. He just finished a round of Doxycycline for a skin infection but this did not help his chest symptoms. He is already on Prednisone, he has been tapering down slowly and he is currently taking 20 mg daily.    Review of Systems  Constitutional: Negative.   HENT: Negative.   Eyes: Negative.   Respiratory: Positive for cough, chest tightness, shortness of breath and wheezing.   Cardiovascular: Negative.        Objective:   Physical Exam  Constitutional: He appears well-developed and well-nourished. No distress.  HENT:  Right Ear: External ear normal.  Left Ear: External ear normal.  Nose: Nose normal.  Mouth/Throat: Oropharynx is clear and moist.  Eyes: Conjunctivae are normal.  Cardiovascular: Normal rate, regular rhythm, normal heart sounds and intact distal pulses.   Pulmonary/Chest: Effort normal. No respiratory distress. He has no rales.  Scattered wheezes and rhonchi           Assessment & Plan:  Bronchitis, treat with Levaquin. Use the inhaler as needed.  Alysia Penna, MD

## 2016-10-13 ENCOUNTER — Other Ambulatory Visit: Payer: Self-pay | Admitting: Family Medicine

## 2016-10-13 DIAGNOSIS — B354 Tinea corporis: Secondary | ICD-10-CM

## 2016-10-15 DIAGNOSIS — S62639D Displaced fracture of distal phalanx of unspecified finger, subsequent encounter for fracture with routine healing: Secondary | ICD-10-CM | POA: Diagnosis not present

## 2016-10-15 DIAGNOSIS — S61319D Laceration without foreign body of unspecified finger with damage to nail, subsequent encounter: Secondary | ICD-10-CM | POA: Diagnosis not present

## 2016-10-21 DIAGNOSIS — M1711 Unilateral primary osteoarthritis, right knee: Secondary | ICD-10-CM | POA: Diagnosis not present

## 2016-10-28 ENCOUNTER — Other Ambulatory Visit: Payer: Self-pay | Admitting: Family Medicine

## 2016-10-29 NOTE — Telephone Encounter (Signed)
Denied.  Message sent to the pharmacy to have pt call the office.

## 2016-11-12 DIAGNOSIS — S62639D Displaced fracture of distal phalanx of unspecified finger, subsequent encounter for fracture with routine healing: Secondary | ICD-10-CM | POA: Diagnosis not present

## 2016-11-12 DIAGNOSIS — S61319D Laceration without foreign body of unspecified finger with damage to nail, subsequent encounter: Secondary | ICD-10-CM | POA: Diagnosis not present

## 2016-11-18 DIAGNOSIS — M75121 Complete rotator cuff tear or rupture of right shoulder, not specified as traumatic: Secondary | ICD-10-CM | POA: Diagnosis not present

## 2016-11-18 DIAGNOSIS — M75111 Incomplete rotator cuff tear or rupture of right shoulder, not specified as traumatic: Secondary | ICD-10-CM | POA: Diagnosis not present

## 2016-11-18 DIAGNOSIS — G8918 Other acute postprocedural pain: Secondary | ICD-10-CM | POA: Diagnosis not present

## 2016-11-18 DIAGNOSIS — M7541 Impingement syndrome of right shoulder: Secondary | ICD-10-CM | POA: Diagnosis not present

## 2016-12-04 ENCOUNTER — Telehealth: Payer: Self-pay | Admitting: Family Medicine

## 2016-12-04 ENCOUNTER — Encounter: Payer: Self-pay | Admitting: Family Medicine

## 2016-12-04 ENCOUNTER — Ambulatory Visit (INDEPENDENT_AMBULATORY_CARE_PROVIDER_SITE_OTHER): Payer: PPO | Admitting: Family Medicine

## 2016-12-04 VITALS — BP 138/99 | HR 94 | Temp 98.5°F | Ht 70.0 in | Wt 243.0 lb

## 2016-12-04 DIAGNOSIS — B029 Zoster without complications: Secondary | ICD-10-CM | POA: Diagnosis not present

## 2016-12-04 MED ORDER — METHYLPREDNISOLONE 4 MG PO TBPK: ORAL_TABLET | ORAL | 0 refills | Status: DC

## 2016-12-04 MED ORDER — VALACYCLOVIR HCL 1 G PO TABS: 1000.0000 mg | ORAL_TABLET | Freq: Three times a day (TID) | ORAL | 0 refills | Status: DC

## 2016-12-04 MED ORDER — LIDOCAINE 5 % EX CREA: 1.0000 "application " | TOPICAL_CREAM | Freq: Two times a day (BID) | CUTANEOUS | 2 refills | Status: DC

## 2016-12-04 NOTE — Progress Notes (Signed)
   Subjective:    Patient ID: [name], male    DOB: 02/11/1951, 66 y.o.   MRN: [id number]  HPI Here for possible shingles. This started 2 weeks ago with clusters of red blisters in the left flank that itched. He was seeing his orthopedic doctor who diagnosed shingles. He gave him a prednisone taper pack and he felt better for a few days. Then the itching returned and now it has become more painful.    Review of Systems  Constitutional: Negative.   Respiratory: Negative.   Cardiovascular: Negative.   Skin: Positive for rash.  Neurological: Negative.        Objective:   Physical Exam  Constitutional: He appears well-developed and well-nourished.  Cardiovascular: Normal rate, regular rhythm, normal heart sounds and intact distal pulses.   Pulmonary/Chest: Effort normal and breath sounds normal. No respiratory distress. He has no wheezes. He has no rales.  Skin:  There is a band of maculopapular erythema on the left chest, left flank, and left middle back          Assessment & Plan:  This is in fact shingles. We will treat him with Valtrex 1000 mg tid and another Medrol dose pack. Add Lidocaine ointment prn. Recheck prn.  Alysia Penna, MD

## 2016-12-04 NOTE — Telephone Encounter (Signed)
Error

## 2016-12-04 NOTE — Patient Instructions (Signed)
WE NOW OFFER   [facility] FAST TRACK!!!  SAME DAY Appointments for ACUTE CARE  Such as: Sprains, Injuries, cuts, abrasions, rashes, muscle pain, joint pain, back pain Colds, flu, sore throats, headache, allergies, cough, fever  Ear pain, sinus and eye infections Abdominal pain, nausea, vomiting, diarrhea, upset stomach Animal/insect bites  3 Easy Ways to Schedule: Walk-In Scheduling Call in scheduling Mychart Sign-up: [email or web address]         

## 2016-12-30 DIAGNOSIS — M25611 Stiffness of right shoulder, not elsewhere classified: Secondary | ICD-10-CM | POA: Diagnosis not present

## 2017-01-01 DIAGNOSIS — M25611 Stiffness of right shoulder, not elsewhere classified: Secondary | ICD-10-CM | POA: Diagnosis not present

## 2017-01-04 DIAGNOSIS — M25611 Stiffness of right shoulder, not elsewhere classified: Secondary | ICD-10-CM | POA: Diagnosis not present

## 2017-01-07 DIAGNOSIS — G8929 Other chronic pain: Secondary | ICD-10-CM | POA: Diagnosis not present

## 2017-01-07 DIAGNOSIS — M25611 Stiffness of right shoulder, not elsewhere classified: Secondary | ICD-10-CM | POA: Diagnosis not present

## 2017-01-07 DIAGNOSIS — M25561 Pain in right knee: Secondary | ICD-10-CM | POA: Diagnosis not present

## 2017-01-22 ENCOUNTER — Telehealth: Payer: Self-pay | Admitting: Family Medicine

## 2017-01-22 NOTE — Telephone Encounter (Signed)
Patient had shingles in the past on his left side and the wounds are gone but he still has the pain. He was just wanting to know if he needs to use a topical cream or is there a medication that he needs to be prescribed to help ease the pain.   Please advise

## 2017-01-25 ENCOUNTER — Other Ambulatory Visit: Payer: Self-pay

## 2017-01-25 MED ORDER — GABAPENTIN 300 MG PO CAPS: 300.0000 mg | ORAL_CAPSULE | Freq: Every day | ORAL | 0 refills | Status: DC

## 2017-01-25 NOTE — Telephone Encounter (Signed)
Please Advise

## 2017-01-25 NOTE — Telephone Encounter (Signed)
Spoke with pt explained dr Sherren Mocha recommendation of Gabapentin 300 mg, Rx was sent to pt pharmacy with direction, pt is aware that if he does not feel better to get a referral to a neurology for further evaluation.

## 2017-01-25 NOTE — Telephone Encounter (Signed)
Gabapentin 300 mg......... directions day 1,,,,,,,,,,, 1 tablet, day 2,,,,,,,,, 1 tablet twice a day,,,,,,,, day 3 take 1 tablet 3 times daily. This is 4-6 weeks until the pain subsides. If he does not  improvement in a week or 2 or the pain gets worse then I would refer him to neurology for further evaluation

## 2017-02-18 ENCOUNTER — Ambulatory Visit (INDEPENDENT_AMBULATORY_CARE_PROVIDER_SITE_OTHER): Payer: PPO | Admitting: *Deleted

## 2017-02-18 DIAGNOSIS — Z23 Encounter for immunization: Secondary | ICD-10-CM

## 2017-03-15 ENCOUNTER — Ambulatory Visit (INDEPENDENT_AMBULATORY_CARE_PROVIDER_SITE_OTHER): Payer: PPO | Admitting: Family Medicine

## 2017-03-15 ENCOUNTER — Encounter: Payer: Self-pay | Admitting: Family Medicine

## 2017-03-15 VITALS — BP 128/86 | HR 88 | Temp 98.1°F | Ht 70.0 in | Wt 244.0 lb

## 2017-03-15 DIAGNOSIS — E7800 Pure hypercholesterolemia, unspecified: Secondary | ICD-10-CM

## 2017-03-15 DIAGNOSIS — Z125 Encounter for screening for malignant neoplasm of prostate: Secondary | ICD-10-CM

## 2017-03-15 DIAGNOSIS — L301 Dyshidrosis [pompholyx]: Secondary | ICD-10-CM

## 2017-03-15 DIAGNOSIS — H833X3 Noise effects on inner ear, bilateral: Secondary | ICD-10-CM | POA: Diagnosis not present

## 2017-03-15 DIAGNOSIS — Z0000 Encounter for general adult medical examination without abnormal findings: Secondary | ICD-10-CM | POA: Diagnosis not present

## 2017-03-15 LAB — CBC WITH DIFFERENTIAL/PLATELET
Basophils Absolute: 0.1 10*3/uL (ref 0.0–0.1)
Basophils Relative: 1.1 % (ref 0.0–3.0)
EOS ABS: 0.4 10*3/uL (ref 0.0–0.7)
Eosinophils Relative: 5.8 % — ABNORMAL HIGH (ref 0.0–5.0)
HCT: 46.5 % (ref 39.0–52.0)
Hemoglobin: 15.8 g/dL (ref 13.0–17.0)
LYMPHS ABS: 1.5 10*3/uL (ref 0.7–4.0)
LYMPHS PCT: 20.8 % (ref 12.0–46.0)
MCHC: 34 g/dL (ref 30.0–36.0)
MCV: 97.9 fl (ref 78.0–100.0)
MONOS PCT: 10.8 % (ref 3.0–12.0)
Monocytes Absolute: 0.8 10*3/uL (ref 0.1–1.0)
NEUTROS ABS: 4.5 10*3/uL (ref 1.4–7.7)
NEUTROS PCT: 61.5 % (ref 43.0–77.0)
PLATELETS: 232 10*3/uL (ref 150.0–400.0)
RBC: 4.75 Mil/uL (ref 4.22–5.81)
RDW: 13.4 % (ref 11.5–15.5)
WBC: 7.3 10*3/uL (ref 4.0–10.5)

## 2017-03-15 LAB — HEPATIC FUNCTION PANEL
ALT: 23 U/L (ref 0–53)
AST: 17 U/L (ref 0–37)
Albumin: 4.3 g/dL (ref 3.5–5.2)
Alkaline Phosphatase: 48 U/L (ref 39–117)
BILIRUBIN DIRECT: 0.1 mg/dL (ref 0.0–0.3)
BILIRUBIN TOTAL: 0.8 mg/dL (ref 0.2–1.2)
Total Protein: 6.6 g/dL (ref 6.0–8.3)

## 2017-03-15 LAB — POCT URINALYSIS DIPSTICK
Bilirubin, UA: NEGATIVE
Glucose, UA: NEGATIVE
Ketones, UA: NEGATIVE
LEUKOCYTES UA: NEGATIVE
NITRITE UA: NEGATIVE
PH UA: 5.5 (ref 5.0–8.0)
PROTEIN UA: NEGATIVE
RBC UA: NEGATIVE
Spec Grav, UA: 1.02 (ref 1.010–1.025)
UROBILINOGEN UA: 0.2 U/dL

## 2017-03-15 LAB — BASIC METABOLIC PANEL
BUN: 19 mg/dL (ref 6–23)
CALCIUM: 9.5 mg/dL (ref 8.4–10.5)
CO2: 31 mEq/L (ref 19–32)
Chloride: 103 mEq/L (ref 96–112)
Creatinine, Ser: 1.12 mg/dL (ref 0.40–1.50)
GFR: 69.32 mL/min (ref >60.00)
Glucose, Bld: 108 mg/dL — ABNORMAL HIGH (ref 70–99)
POTASSIUM: 4.4 meq/L (ref 3.5–5.1)
SODIUM: 139 meq/L (ref 135–145)

## 2017-03-15 LAB — LIPID PANEL
CHOLESTEROL: 217 mg/dL — AB (ref 0–200)
HDL: 42.4 mg/dL (ref >39.00)
LDL CALC: 153 mg/dL — AB (ref 0–99)
NonHDL: 174.29
Total CHOL/HDL Ratio: 5
Triglycerides: 107 mg/dL (ref 0.0–149.0)
VLDL: 21.4 mg/dL (ref 0.0–40.0)

## 2017-03-15 LAB — TSH: TSH: 2.53 u[IU]/mL (ref 0.35–4.50)

## 2017-03-15 LAB — PSA: PSA: 0.26 ng/mL (ref 0.10–4.00)

## 2017-03-15 MED ORDER — PREDNISONE 20 MG PO TABS: ORAL_TABLET | ORAL | 1 refills | Status: DC

## 2017-03-15 NOTE — Patient Instructions (Signed)
Labs today........ I will call if anything abnormal  Continue diet and exercise program.........Marland Kitchen good job losing weight........ continue that program  Follow-up in one year sooner if any problems

## 2017-03-15 NOTE — Progress Notes (Signed)
[name] is a 66 year old married male nonsmoker who comes in today for general physical examination  He had surgery on his right shoulder this past summer by Dr. Dellis Filbert at [facility] orth[facility]. He subsequently developed shingles. The shingles have partially resolved although he still has some tingling in his left anterior chest wall.  He saw urologist last spring for follow-up of a kidney stone. They did do a prostate check there so will not B. Here.  He gets routine eye care, dental care, colonoscopy 2018 normal  Vaccinations up-to-date. Discussed getting the shingles vaccine in a year from now since he just had the disease this past spring and summer.  Cognitive function normal he walks daily home health safety reviewed no issues identified, no guns in the house, he does have a healthcare power of attorney and living well  He uses albuterol 1 or 2 puffs 2-3 times daily when he has a flare up of asthma. She is a triggered by a viral infection. He's been asymptomatic in the last 12 months has not had to use any albuterol at all.  He has a history of severe eczema. It mainly involves his hands. Once or twice a year he'll take a short course of oral prednisone. We tried creams lotions [place] consult all of which is been on effective however the short course of prednisone resolves his severe hand eczema.  14 point review of systems is otherwise negative  BP 128/86 (BP Location: Left Arm, Patient Position: Sitting, Cuff Size: Normal)   Pulse 88   Temp 98.1 F (36.7 C) (Oral)   Ht 5\' 10"  (1.778 m)   Wt 244 lb (110.7 kg)   BMI 35.01 kg/m  HEENT were negative except for bilateral hearing aids  Thyroid is not enlarged no carotid bruits cardiopulmonary exam normal abdominal exam normal except for ventral hernia and a rather large abdomen. Genitalia normal circumcised . Rectal done by urologist therefore not repeated. Extremities normal skin normal peripheral pulses normal  #1 healthy male  #2  status post right rotator cuff surgery July 2018  #3 recent bout of shingles now with residual pain left anterior chest wall  #4 history of asthma associated with viral infections......... asymptomatic 12 months  #5 history of severe eczema hands............ short course of oral prednisone when necessary

## 2017-03-25 ENCOUNTER — Other Ambulatory Visit: Payer: Self-pay | Admitting: Family Medicine

## 2017-05-25 ENCOUNTER — Other Ambulatory Visit: Payer: Self-pay | Admitting: Family Medicine

## 2017-05-25 DIAGNOSIS — M1711 Unilateral primary osteoarthritis, right knee: Secondary | ICD-10-CM | POA: Diagnosis not present

## 2017-08-16 ENCOUNTER — Encounter: Payer: Self-pay | Admitting: Family Medicine

## 2017-08-16 ENCOUNTER — Ambulatory Visit (INDEPENDENT_AMBULATORY_CARE_PROVIDER_SITE_OTHER): Payer: PPO | Admitting: Family Medicine

## 2017-08-16 VITALS — BP 140/84 | HR 88 | Temp 98.2°F | Wt 238.0 lb

## 2017-08-16 DIAGNOSIS — L301 Dyshidrosis [pompholyx]: Secondary | ICD-10-CM

## 2017-08-16 DIAGNOSIS — L309 Dermatitis, unspecified: Secondary | ICD-10-CM

## 2017-08-16 DIAGNOSIS — M67432 Ganglion, left wrist: Secondary | ICD-10-CM

## 2017-08-16 MED ORDER — PREDNISONE 20 MG PO TABS: ORAL_TABLET | ORAL | 2 refills | Status: DC

## 2017-08-16 MED ORDER — FLUOCINONIDE 0.05 % EX GEL: 1.0000 "application " | Freq: Two times a day (BID) | CUTANEOUS | 2 refills | Status: DC

## 2017-08-16 NOTE — Progress Notes (Signed)
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 Been to see numerous dermatologist and no cure can never be found. When he has a severe flare once or twice a year he'll take a short course of oral prednisone that resolves the issue  He also has some skin lesions on his right lower extremity he would like checked  He also has a lump in his left wrist he would like checked  BP 140/84 (BP Location: Left Arm, Patient Position: Sitting, Cuff Size: Normal)   Pulse 88   Temp 98.2 F (36.8 C) (Oral)   Wt 238 lb (108 kg)   BMI 34.15 kg/m  Well-developed well-nourished male no acute distress vital signs stable he is afebrile examination hand shows the beginnings of his dyshidrotic eczema. No evidence of secondary infection  Examination wrist shows a marble-sized lesion dorsum of his right left wrist. It's hard fixed and nonmovable  Examination lower extremity shows some eczematoid-like lesions on his left and right lower calf  #1 dyshidrotic eczema of the hands.........Marland Kitchen refill medication  #2 cystic lesion left wrist.........Marland Kitchen refer to hand surgeon  #3 eczema lower extremities..........Marland Kitchen Lidex gel

## 2017-08-16 NOTE — Patient Instructions (Signed)
Lidex gel............ small amounts twice daily to the lesions on your lower extremities  Prednisone 20 mg........ uses directed  I would recommend you see a hand surgeon.......Marland Kitchen Dr. Fredna Dow......... at the hand center for evaluation of the lesion on the dorsum of your right wrist.

## 2017-08-31 NOTE — Progress Notes (Addendum)
Subjective:   [name] is a 67 y.o. male who presents for an Initial Medicare Annual Wellness Visit.  Reports health as  Just seen Dr. Sherren Mocha 07/2017 -  On prednisone for eczema  Labs 02/2017  Sister with HD  Dad in a nursing home ( he is 79)  Married 40 years 2 children;  dtr is in [place] and son is [place]. 3 grands   Diet  BMI 35  Chol/hdl ratio 5; chol 217; hdl 42 ldl 153 and trig 107  BS A1c 5.7 in 2016; FBS  108  Trying to lose weight   Exercise hdl 42; ldl 153  Walks at least 2  [place] and busy    ETOH 2 beers per day  Non smoker   There are no preventive care reminders to display for this patient.  Cardiac Risk Factors include: advanced age (>59men, >80 women);family history of premature cardiovascular disease;hypertension;male gender;obesity (BMI >30kg/m2)   Colonoscopy 08/2016 - due 2023 PSA -checked at physical   Shingrix education/ history of shingles Educated on shingrix    Objective:    Today's Vitals   09/01/17 0819  BP: (!) 150/86  Pulse: 91  SpO2: 97%  Weight: 238 lb (108 kg)  Height: 5\' 11"  (1.803 m)   Body mass index is 33.19 kg/m.  Advanced Directives 09/01/2017 09/24/2016 09/03/2016 08/25/2016 05/26/2013 05/13/2012  Does Patient Have a Medical Advance Directive? Yes Yes Yes Yes Patient has advance directive, copy not in chart Patient does not have advance directive;Patient would not like information  Type of Advance Directive - [place];Living will Healthcare Power of [place];Living will Living will;Healthcare Power of Attorney -  Does patient want to make changes to medical advance directive? - - - - No -  Copy of [place] in Chart? - No - copy requested - No - copy requested - -    Current Medications (verified) Outpatient Encounter Medications as of 09/01/2017  Medication Sig  . albuterol (PROVENTIL HFA;VENTOLIN HFA) 108 (90 Base) MCG/ACT inhaler Inhale 2 puffs into the lungs  every 6 (six) hours as needed for wheezing or shortness of breath.  . COCONUT OIL PO Take 1 capsule by mouth daily.   . Cyanocobalamin (VITAMIN B-12) 5000 MCG TBDP Take 5,000 mcg by mouth daily.  . fluocinonide gel (LIDEX) 2.02 % Apply 1 application topically 2 (two) times daily.  . predniSONE (DELTASONE) 10 MG tablet TAKE 1 TABLET BY MOUTH MONDAY, WEDNESDAY AND FRIDAY  . predniSONE (DELTASONE) 20 MG tablet 2 tabs x 3 days, 1 tab x 3 days, 1/2 tab x 3 days, 1/2 tab M,W,F x 2 weeks  . predniSONE (DELTASONE) 20 MG tablet 2 tabs x 3 days, 1 tab x 3 days, 1/2 tab x 3 days, 1/2 tab M,W,F x 2 weeks  . Lidocaine 5 % CREA Apply 1 application topically 2 (two) times daily. (Patient not taking: Reported on 09/01/2017)   Facility-Administered Encounter Medications as of 09/01/2017  Medication  . 0.9 %  sodium chloride infusion    Allergies (verified) Sesame oil and Tobramycin   History: Past Medical History:  Diagnosis Date  . Anxiety   . Arthritis   . Asthma   . At risk for sleep apnea    STOP-BANG = 4  SENT TO PCP 05-24-2013  . Eczema   . Edema of foot   . Finger wound, simple, open    left middle index finger - stitches and dressing  occured on 09/22/2016- followed by Dr Daws at [facility]   . H/O hiatal hernia   . History of kidney stones   . HOH (hard of hearing)   . Hyperlipidemia   . Influenza A   . Influenza B    symptoms started 05-16-2013/  dx 05-17-2013 by pcp  . Left ankle injury    twisted left ankle   . Left ureteral calculus   . Pneumonia    hx of   . Rash    right leg- calf -   . Wears dentures    bottom   Past Surgical History:  Procedure Laterality Date  . ANTERIOR CERVICAL DECOMP/DISCECTOMY FUSION  01-27-2007   C6  --- C7  . COLONOSCOPY    . CYSTOSCOPY WITH URETEROSCOPY Left 05/26/2013   Procedure: CYSTOSCOPY WITH URETEROSCOPY, URETHERAL DILATATION,LEFT RETROGRADE, LASER LITHOTRIPSY, STENT PLACMENT, LEFT URETEROSCOPY;  Surgeon: Ailene Rud, MD;   Location: [facility];  Service: Urology;  Laterality: Left;  . FINGER FRACTURE SURGERY  AS CHILD  . HOLMIUM LASER APPLICATION Left 12/08/4816   Procedure: HOLMIUM LASER APPLICATION;  Surgeon: Ailene Rud, MD;  Location: [facility];  Service: Urology;  Laterality: Left;  . LIPOMA EXCISION  05/17/2012   Procedure: EXCISION LIPOMA;  Surgeon: Gayland Curry, MD,FACS;  Location: [place];  Service: General;  Laterality: Left;  incisional biopsy of left shoulder soft tissue mass  . NASAL FRACTURE SURGERY  AS CHILD  . ORIF ULNAR FRACTURE Right AS CHILD  . ROTATOR CUFF REPAIR     right shoulder [id number] by [place]   Family History  Problem Relation Age of Onset  . Arthritis Other   . Hypertension Other   . Osteoporosis Other   . Heart disease Other   . Lung disease Other   . Prostate cancer Father   . Heart disease Sister   . Colon cancer Neg Hx   . Colon polyps Neg Hx   . Esophageal cancer Neg Hx   . Kidney disease Neg Hx   . Rectal cancer Neg Hx   . Stomach cancer Neg Hx    Social History   Socioeconomic History  . Marital status: Married    Spouse name: Not on file  . Number of children: 2  . Years of education: Not on file  . Highest education level: Not on file  Occupational History  . Occupation: [employer]  . Financial resource strain: Not on file  . Food insecurity:    Worry: Not on file    Inability: Not on file  . Transportation needs:    Medical: Not on file    Non-medical: Not on file  Tobacco Use  . Smoking status: Never Smoker  . Smokeless tobacco: Never Used  Substance and Sexual Activity  . Alcohol use: Yes    Alcohol/week: 4.2 oz    Types: 7 Cans of beer per week    Comment: beer- 2 beers day sometimes   . Drug use: No  . Sexual activity: Not on file  Lifestyle  . Physical activity:    Days per week: Not on file    Minutes per session:  Not on file  . Stress: Not on file  Relationships  . Social connections:    Talks on phone: Not on file    Gets together: Not on file    Attends religious service: Not on file  Active member of club or organization: Not on file    Attends meetings of clubs or organizations: Not on file    Relationship status: Not on file  Other Topics Concern  . Not on file  Social History Narrative  . Not on file   Tobacco Counseling Counseling given: Yes   Clinical Intake:    Activities of Daily Living In your present state of health, do you have any difficulty performing the following activities: 09/01/2017 09/24/2016  Hearing? N Y  Comment - hearing aids   Vision? N N  Comment - glasses   Difficulty concentrating or making decisions? N N  Walking or climbing stairs? N N  Dressing or bathing? N N  Doing errands, shopping? N N  Preparing Food and eating ? N -  Using the Toilet? N -  In the past six months, have you accidently leaked urine? N -  Do you have problems with loss of bowel control? N -  Managing your Medications? N -  Managing your Finances? N -  Housekeeping or managing your Housekeeping? N -  Some recent data might be hidden     Immunizations and Health Maintenance Immunization History  Administered Date(s) Administered  . Influenza Split 02/16/2013  . Influenza Whole 01/23/2009, 01/16/2010  . Influenza, High Dose Seasonal PF 02/25/2015, 03/10/2016, 02/18/2017  . Influenza,inj,Quad PF,6+ Mos 02/06/2014  . Pneumococcal Conjugate-13 02/06/2014  . Pneumococcal Polysaccharide-23 03/27/1998, 04/14/2007, 12/08/2007, 03/10/2016  . Td 04/27/2004  . Tdap 02/06/2014   There are no preventive care reminders to display for this patient.  Patient Care Team: Dorena Cookey, MD as PCP - General  Indicate any recent Medical Services you may have received from other than [facility] providers in the past year (date may be approximate).    Assessment:   This is a routine wellness  examination for [employer].  Hearing/Vision screen Hearing Screening Comments: Has hearing aids  Vision Screening Comments: Vision checks is due   Dietary issues and exercise activities discussed: Current Exercise Habits: Home exercise routine, Type of exercise: strength training/weights, Time (Minutes): 60, Frequency (Times/Week): 5, Weekly Exercise (Minutes/Week): 300  Goals    . Weight (lb) < 225 lb (102.1 kg)     Cutting back on portions  Change happens slowly       Depression Screen PHQ 2/9 Scores 09/01/2017 03/15/2017 03/10/2016 06/07/2014  PHQ - 2 Score 0 0 0 0    Fall Risk Fall Risk  09/01/2017 03/10/2016 06/07/2014  Falls in the past year? No No No     Cognitive Function: MMSE - Mini Mental State Exam 09/01/2017  Not completed: (No Data)     Ad8 score reviewed for issues:  Issues making decisions:  Less interest in hobbies / activities:  Repeats questions, stories (family complaining):  Trouble using ordinary gadgets (microwave, computer, phone):  Forgets the month or year:   Mismanaging finances:   Remembering appts:  Daily problems with thinking and/or memory: Ad8 score is=0      Screening Tests Health Maintenance  Topic Date Due  . Hepatitis C Screening  09/07/2017 (Originally Jul 24, 1950)  . INFLUENZA VACCINE  11/25/2017  . COLONOSCOPY  09/03/2021  . TETANUS/TDAP  02/07/2024  . PNA vac Low Risk Adult  Completed         Plan:      PCP Notes   Health Maintenance Will schedule an eye visit soon Will come in NPO at the next CPE in November   Abnormal Screens  BP elevated  moderately today; Resistant to education Encouraged to check his BP periodically but he declines Father is in a snf; wife recovering from surgery;   Referrals  none  Patient concerns; None; is trying to lose weight   Nurse Concerns; States he will postpone shingrix as he just had shingles x 8 months ago Education provided and he is aware of vaccinations. Will defer  to Dr. Sherren Mocha on November  Next PCP apt To schedule in Nov.    I have personally reviewed and noted the following in the patient's chart:   . Medical and social history . Use of alcohol, tobacco or illicit drugs  . Current medications and supplements . Functional ability and status . Nutritional status . Physical activity . Advanced directives . List of other physicians . Hospitalizations, surgeries, and ER visits in previous 12 months . Vitals . Screenings to include cognitive, depression, and falls . Referrals and appointments  In addition, I have reviewed and discussed with patient certain preventive protocols, quality metrics, and best practice recommendations. A written personalized care plan for preventive services as well as general preventive health recommendations were provided to patient.     XLEZV,GJFTN, RN   09/01/2017   Above notes reviewed in absence of primary provider.  Agree with assessment and plan as above.  Eulas Post MD [facility]

## 2017-09-01 ENCOUNTER — Ambulatory Visit (INDEPENDENT_AMBULATORY_CARE_PROVIDER_SITE_OTHER): Payer: PPO

## 2017-09-01 VITALS — BP 150/86 | HR 91 | Ht 71.0 in | Wt 238.0 lb

## 2017-09-01 DIAGNOSIS — Z0000 Encounter for general adult medical examination without abnormal findings: Secondary | ICD-10-CM

## 2017-09-01 NOTE — Patient Instructions (Addendum)
Mr. Sermeno , Thank you for taking time to come for your Medicare Wellness Visit. I appreciate your ongoing commitment to your health goals. Please review the following plan we discussed and let me know if I can assist you in the future.   Your labs and physical are due in Nov. Sometime after the 19th Also make another wellness apt when you leave today with Manuela Schwartz  Minimal Blood Pressure Goal= AVERAGE < 140/90; Ideal is an AVERAGE < 135/85. This AVERAGE should be calculated from @ least 5-7 BP readings taken @ different times of day on different days of week. You should not respond to isolated BP readings , but rather the AVERAGE for that week .Please bring your blood pressure cuff to office visits to verify that it is reliable.It can also be checked against the blood pressure device at the pharmacy. Finger or wrist cuffs are not dependable; an arm cuff is.  Also, monitor sodium intake in label of any canned food, as well as sodium in common everyday condiments; ketchup; salad dressing, sauces and any fast food restaurant.     Will make an apt for eyes to be examined   Shingrix is a vaccine for the prevention of Shingles in Adults 50 and older.  If you are on Medicare, the shingrix is covered under your Part D plan, so you will take both of the vaccines in the series at your pharmacy. Please check with your benefits regarding applicable copays or out of pocket expenses.  The Shingrix is given in 2 vaccines approx 8 weeks apart. You must receive the 2nd dose prior to 6 months from receipt of the first. Please have the pharmacist print out you Immunization  dates for our office records    These are the goals we discussed: Goals    . Weight (lb) < 225 lb (102.1 kg)     Cutting back on portions  Change happens slowly        This is a list of the screening recommended for you and due dates:  Health Maintenance  Topic Date Due  .  Hepatitis C: One time screening is recommended by Center  for Disease Control  (CDC) for  adults born from [birth year] through 1965.   09/07/2017*  . Flu Shot  11/25/2017  . Colon Cancer Screening  09/03/2021  . Tetanus Vaccine  02/07/2024  . Pneumonia vaccines  Completed  *Topic was postponed. The date shown is not the original due date.      Screening for Type 2 Diabetes A screening test for type 2 diabetes (type 2 diabetes mellitus) is a blood test to measure your blood sugar (glucose) level. This test is done to check for early signs of diabetes, before you develop symptoms. Type 2 diabetes is a long-term (chronic) disease that occurs when the pancreas does not make enough of a hormone called insulin. This results in high blood glucose levels, which can cause many complications. You may be screened for type 2 diabetes as part of your regular health care, especially if you have a high risk for diabetes. Screening can help identify type 2 diabetes at its early stage (prediabetes). Identifying and treating prediabetes may delay or prevent development of type 2 diabetes. What are the risk factors for type 2 diabetes? The following factors may make you more likely to develop type 2 diabetes:  Having a parent or sibling (first-degree relative) who has diabetes.  Being overweight or obese.  Being of American-Indian, Pacific Islander, Hispanic,  Latino, Asian, or African-American descent.  Not getting enough exercise.  Being older than 50.  Having a history of diabetes during pregnancy (gestational diabetes).  Having low levels of good cholesterol (HDL-C) or high levels of blood fats (triglycerides).  Having high blood glucose in a previous blood test.  Having high blood pressure.  Having certain diseases or conditions, including: ? Acanthosis nigricans. This is a condition that causes dark skin on the neck, armpits, and groin. ? Polycystic ovary syndrome (PCOS). ? Heart disease.  Having delivered a baby who weighed more than 9 lb (4.1  kg).  Who should be screened for type 2 diabetes? Adults  Adults age 58 and older. These adults should be screened at least once every three years.  Adults who are younger than 54, overweight, and have at least one other risk factor. These adults should be screened at least once every three years.  Adults who have normal blood glucose levels and two or more risk factors. These adults may be screened once every year (annually).  Women who have had gestational diabetes in the past. These women should be screened at least once every three years.  Pregnant women who have risk factors. These women should be screened at their first prenatal visit.  Pregnant women with no risk factors. These women should be screened between weeks 24 and 28 of pregnancy. Children and adolescents  Children and adolescents should be screened for type 2 diabetes if they are overweight and have 2 of the following risk factors: ? A family history of type 2 diabetes. ? Being a member of a high risk race or ethnic group. ? Signs of insulin resistance or conditions associated with insulin resistance. ? A mother who had gestational diabetes while pregnant with him or her.  Screening should be done at least once every three years, starting at age 49. Your health care provider or your child's health care provider may recommend having a screening more or less often. What happens during screening? During screening, your health care provider may ask questions about:  Your health and your risk factors, including your activity level and any medical conditions that you have.  The health of your first-degree relatives.  Past pregnancies, if this applies.  Your health care provider will also do a physical exam, including a blood pressure measurement and blood tests. There are four blood tests that can be used to screen for type 2 diabetes. You may have one or more of the following:  A fasting plasma glucose test (FBG). You  will not be allowed to eat for at least eight hours before a blood sample is taken.  A random blood glucose test. This test checks your blood glucose at any time of the day regardless of when you ate.  An oral glucose tolerance test (OGTT). This test measures your blood glucose at two times: ? After you have not eaten (have fasted) overnight. ? Two hours after you drink a glucose-containing beverage. A diagnosis can be made if the level is greater than 200 mg/dL.  An A1c test. This test provides information about blood glucose control over the previous three months.  What do the results mean? Your test results are a measurement of how much glucose is in your blood. Normal blood glucose levels mean that you do not have diabetes or prediabetes. High blood glucose levels may mean that you have prediabetes or diabetes. Depending on the results, other tests may be needed to confirm the diagnosis. This information  is not intended to replace advice given to you by your health care provider. Make sure you discuss any questions you have with your health care provider. Document Released: 02/07/2009 Document Revised: 09/19/2015 Document Reviewed: 02/08/2015 Elsevier Interactive Patient Education  2017 [place] Prevention in the Home Falls can cause injuries. They can happen to people of all ages. There are many things you can do to make your home safe and to help prevent falls. What can I do on the outside of my home?  Regularly fix the edges of walkways and driveways and fix any cracks.  Remove anything that might make you trip as you walk through a door, such as a raised step or threshold.  Trim any bushes or trees on the path to your home.  Use bright outdoor lighting.  Clear any walking paths of anything that might make someone trip, such as rocks or tools.  Regularly check to see if handrails are loose or broken. Make sure that both sides of any steps have handrails.  Any  raised decks and porches should have guardrails on the edges.  Have any leaves, snow, or ice cleared regularly.  Use sand or salt on walking paths during winter.  Clean up any spills in your garage right away. This includes oil or grease spills. What can I do in the bathroom?  Use night lights.  Install grab bars by the toilet and in the tub and shower. Do not use towel bars as grab bars.  Use non-skid mats or decals in the tub or shower.  If you need to sit down in the shower, use a plastic, non-slip stool.  Keep the floor dry. Clean up any water that spills on the floor as soon as it happens.  Remove soap buildup in the tub or shower regularly.  Attach bath mats securely with double-sided non-slip rug tape.  Do not have throw rugs and other things on the floor that can make you trip. What can I do in the bedroom?  Use night lights.  Make sure that you have a light by your bed that is easy to reach.  Do not use any sheets or blankets that are too big for your bed. They should not hang down onto the floor.  Have a firm chair that has side arms. You can use this for support while you get dressed.  Do not have throw rugs and other things on the floor that can make you trip. What can I do in the kitchen?  Clean up any spills right away.  Avoid walking on wet floors.  Keep items that you use a lot in easy-to-reach places.  If you need to reach something above you, use a strong step stool that has a grab bar.  Keep electrical cords out of the way.  Do not use floor polish or wax that makes floors slippery. If you must use wax, use non-skid floor wax.  Do not have throw rugs and other things on the floor that can make you trip. What can I do with my stairs?  Do not leave any items on the stairs.  Make sure that there are handrails on both sides of the stairs and use them. Fix handrails that are broken or loose. Make sure that handrails are as long as the  stairways.  Check any carpeting to make sure that it is firmly attached to the stairs. Fix any carpet that is loose or worn.  Avoid having throw  rugs at the top or bottom of the stairs. If you do have throw rugs, attach them to the floor with carpet tape.  Make sure that you have a light switch at the top of the stairs and the bottom of the stairs. If you do not have them, ask someone to add them for you. What else can I do to help prevent falls?  Wear shoes that: ? Do not have high heels. ? Have rubber bottoms. ? Are comfortable and fit you well. ? Are closed at the toe. Do not wear sandals.  If you use a stepladder: ? Make sure that it is fully opened. Do not climb a closed stepladder. ? Make sure that both sides of the stepladder are locked into place. ? Ask someone to hold it for you, if possible.  Clearly mark and make sure that you can see: ? Any grab bars or handrails. ? First and last steps. ? Where the edge of each step is.  Use tools that help you move around (mobility aids) if they are needed. These include: ? Canes. ? Walkers. ? Scooters. ? Crutches.  Turn on the lights when you go into a dark area. Replace any light bulbs as soon as they burn out.  Set up your furniture so you have a clear path. Avoid moving your furniture around.  If any of your floors are uneven, fix them.  If there are any pets around you, be aware of where they are.  Review your medicines with your doctor. Some medicines can make you feel dizzy. This can increase your chance of falling. Ask your doctor what other things that you can do to help prevent falls. This information is not intended to replace advice given to you by your health care provider. Make sure you discuss any questions you have with your health care provider. Document Released: 02/07/2009 Document Revised: 09/19/2015 Document Reviewed: 05/18/2014 Elsevier Interactive Patient Education  2018 [name]  Maintenance, Male A healthy lifestyle and preventive care is important for your health and wellness. Ask your health care provider about what schedule of regular examinations is right for you. What should I know about weight and diet? Eat a Healthy Diet  Eat plenty of vegetables, fruits, whole grains, low-fat dairy products, and lean protein.  Do not eat a lot of foods high in solid fats, added sugars, or salt.  Maintain a Healthy Weight Regular exercise can help you achieve or maintain a healthy weight. You should:  Do at least 150 minutes of exercise each week. The exercise should increase your heart rate and make you sweat (moderate-intensity exercise).  Do strength-training exercises at least twice a week.  Watch Your Levels of Cholesterol and Blood Lipids  Have your blood tested for lipids and cholesterol every 5 years starting at 69 years of age. If you are at high risk for heart disease, you should start having your blood tested when you are 69 years old. You may need to have your cholesterol levels checked more often if: ? Your lipid or cholesterol levels are high. ? You are older than 69 years of age. ? You are at high risk for heart disease.  What should I know about cancer screening? Many types of cancers can be detected early and may often be prevented. Lung Cancer  You should be screened every year for lung cancer if: ? You are a current smoker who has smoked for at least 30 years. ? You are  a former smoker who has quit within the past 15 years.  Talk to your health care provider about your screening options, when you should start screening, and how often you should be screened.  Colorectal Cancer  Routine colorectal cancer screening usually begins at 69 years of age and should be repeated every 5-10 years until you are 69 years old. You may need to be screened more often if early forms of precancerous polyps or small growths are found. Your health care provider may  recommend screening at an earlier age if you have risk factors for colon cancer.  Your health care provider may recommend using home test kits to check for hidden blood in the stool.  A small camera at the end of a tube can be used to examine your colon (sigmoidoscopy or colonoscopy). This checks for the earliest forms of colorectal cancer.  Prostate and Testicular Cancer  Depending on your age and overall health, your health care provider may do certain tests to screen for prostate and testicular cancer.  Talk to your health care provider about any symptoms or concerns you have about testicular or prostate cancer.  Skin Cancer  Check your skin from head to toe regularly.  Tell your health care provider about any new moles or changes in moles, especially if: ? There is a change in a mole's size, shape, or color. ? You have a mole that is larger than a pencil eraser.  Always use sunscreen. Apply sunscreen liberally and repeat throughout the day.  Protect yourself by wearing long sleeves, pants, a wide-brimmed hat, and sunglasses when outside.  What should I know about heart disease, diabetes, and high blood pressure?  If you are 7-29 years of age, have your blood pressure checked every 3-5 years. If you are 50 years of age or older, have your blood pressure checked every year. You should have your blood pressure measured twice-once when you are at a hospital or clinic, and once when you are not at a hospital or clinic. Record the average of the two measurements. To check your blood pressure when you are not at a hospital or clinic, you can use: ? An automated blood pressure machine at a pharmacy. ? A home blood pressure monitor.  Talk to your health care provider about your target blood pressure.  If you are between 75-53 years old, ask your health care provider if you should take aspirin to prevent heart disease.  Have regular diabetes screenings by checking your fasting blood  sugar level. ? If you are at a normal weight and have a low risk for diabetes, have this test once every three years after the age of 21. ? If you are overweight and have a high risk for diabetes, consider being tested at a younger age or more often.  A one-time screening for abdominal aortic aneurysm (AAA) by ultrasound is recommended for men aged 79-75 years who are current or former smokers. What should I know about preventing infection? Hepatitis B If you have a higher risk for hepatitis B, you should be screened for this virus. Talk with your health care provider to find out if you are at risk for hepatitis B infection. Hepatitis C Blood testing is recommended for:  Everyone born from [birth year] through 1967.  Anyone with known risk factors for hepatitis C.  Sexually Transmitted Diseases (STDs)  You should be screened each year for STDs including gonorrhea and chlamydia if: ? You are sexually active and are younger than  69 years of age. ? You are older than 69 years of age and your health care provider tells you that you are at risk for this type of infection. ? Your sexual activity has changed since you were last screened and you are at an increased risk for chlamydia or gonorrhea. Ask your health care provider if you are at risk.  Talk with your health care provider about whether you are at high risk of being infected with HIV. Your health care provider may recommend a prescription medicine to help prevent HIV infection.  What else can I do?  Schedule regular health, dental, and eye exams.  Stay current with your vaccines (immunizations).  Do not use any tobacco products, such as cigarettes, chewing tobacco, and e-cigarettes. If you need help quitting, ask your health care provider.  Limit alcohol intake to no more than 2 drinks per day. One drink equals 12 ounces of beer, 5 ounces of wine, or 1 ounces of hard liquor.  Do not use street drugs.  Do not share needles.  Ask your  health care provider for help if you need support or information about quitting drugs.  Tell your health care provider if you often feel depressed.  Tell your health care provider if you have ever been abused or do not feel safe at home. This information is not intended to replace advice given to you by your health care provider. Make sure you discuss any questions you have with your health care provider. Document Released: 10/10/2007 Document Revised: 12/11/2015 Document Reviewed: 01/15/2015 Elsevier Interactive Patient Education  [employer].

## 2017-09-15 ENCOUNTER — Encounter: Payer: Self-pay | Admitting: Family Medicine

## 2017-09-15 ENCOUNTER — Ambulatory Visit (INDEPENDENT_AMBULATORY_CARE_PROVIDER_SITE_OTHER): Payer: PPO | Admitting: Family Medicine

## 2017-09-15 VITALS — BP 112/74 | HR 83 | Temp 98.3°F | Wt 236.0 lb

## 2017-09-15 DIAGNOSIS — J019 Acute sinusitis, unspecified: Secondary | ICD-10-CM | POA: Diagnosis not present

## 2017-09-15 MED ORDER — HYDROCODONE-HOMATROPINE 5-1.5 MG/5ML PO SYRP: 5.0000 mL | ORAL_SOLUTION | ORAL | 0 refills | Status: DC | PRN

## 2017-09-15 MED ORDER — LEVOFLOXACIN 500 MG PO TABS: 500.0000 mg | ORAL_TABLET | Freq: Every day | ORAL | 0 refills | Status: AC

## 2017-09-15 NOTE — Progress Notes (Signed)
   Subjective:    Patient ID: [name], male    DOB: 1950/10/30, 67 y.o.   MRN: [id number]  HPI Here for 3 days of sinus pressure, PND, and coughing up green sputum.    Review of Systems  Constitutional: Negative.   HENT: Positive for congestion, postnasal drip, sinus pressure and sinus pain. Negative for sore throat.   Eyes: Negative.   Respiratory: Positive for cough.        Objective:   Physical Exam  Constitutional: He appears well-developed and well-nourished.  HENT:  Right Ear: External ear normal.  Left Ear: External ear normal.  Nose: Nose normal.  Mouth/Throat: Oropharynx is clear and moist.  Eyes: Conjunctivae are normal.  Neck: No thyromegaly present.  Pulmonary/Chest: Effort normal and breath sounds normal. No stridor. No respiratory distress. He has no wheezes. He has no rales.  Lymphadenopathy:    He has no cervical adenopathy.          Assessment & Plan:  Sinusitis, treat with Levaquin. Alysia Penna, MD

## 2017-11-08 ENCOUNTER — Other Ambulatory Visit: Payer: Self-pay

## 2017-11-08 ENCOUNTER — Telehealth: Payer: Self-pay | Admitting: Family Medicine

## 2017-11-08 MED ORDER — FLUOCINONIDE 0.05 % EX GEL: 1.0000 "application " | Freq: Two times a day (BID) | CUTANEOUS | 2 refills | Status: DC

## 2017-11-08 NOTE — Telephone Encounter (Signed)
Patient needs refill on:  fluocinonide gel (LIDEX) 0.05 %   Sent to: [id number] - [place], [place] - [address] AT [address] [phone] (Phone) [phone] (Fax)

## 2017-11-08 NOTE — Telephone Encounter (Signed)
Pt picked up Rx from the office this morning

## 2017-12-07 ENCOUNTER — Encounter: Payer: Self-pay | Admitting: Family Medicine

## 2017-12-07 ENCOUNTER — Ambulatory Visit (INDEPENDENT_AMBULATORY_CARE_PROVIDER_SITE_OTHER): Payer: PPO | Admitting: Family Medicine

## 2017-12-07 VITALS — BP 140/72 | HR 104 | Temp 98.4°F | Ht 71.0 in | Wt 243.8 lb

## 2017-12-07 DIAGNOSIS — R21 Rash and other nonspecific skin eruption: Secondary | ICD-10-CM | POA: Diagnosis not present

## 2017-12-08 ENCOUNTER — Encounter: Payer: Self-pay | Admitting: Family Medicine

## 2017-12-08 NOTE — Progress Notes (Signed)
   Subjective:    Patient ID: [name], male    DOB: 1950/09/11, 67 y.o.   MRN: [id number]  HPI Here for a rash on the right leg that started 5 months ago but which has been slowly spreading since then. He has been applying Lidex gel bid with no relief. The rash itches but is not painful.    Review of Systems  Constitutional: Negative.   Respiratory: Negative.   Cardiovascular: Negative.   Skin: Positive for rash.  Neurological: Negative.        Objective:   Physical Exam  Constitutional: He appears well-developed and well-nourished.  Cardiovascular: Normal rate, regular rhythm, normal heart sounds and intact distal pulses.  Pulmonary/Chest: Effort normal and breath sounds normal.  Skin:  The entire medial thigh and part of the medial lower leg has a flat red scaly rash in large contiguous patches           Assessment & Plan:  Rash of uncertain etiology. Refer to Dermatology.  Alysia Penna, MD

## 2017-12-09 DIAGNOSIS — M1711 Unilateral primary osteoarthritis, right knee: Secondary | ICD-10-CM | POA: Diagnosis not present

## 2017-12-28 DIAGNOSIS — B354 Tinea corporis: Secondary | ICD-10-CM | POA: Diagnosis not present

## 2018-01-25 ENCOUNTER — Encounter: Payer: Self-pay | Admitting: Family Medicine

## 2018-01-25 ENCOUNTER — Ambulatory Visit (INDEPENDENT_AMBULATORY_CARE_PROVIDER_SITE_OTHER): Payer: PPO | Admitting: Family Medicine

## 2018-01-25 VITALS — BP 120/72 | HR 93 | Temp 98.9°F | Wt 242.0 lb

## 2018-01-25 DIAGNOSIS — J45909 Unspecified asthma, uncomplicated: Secondary | ICD-10-CM

## 2018-01-25 MED ORDER — METHYLPREDNISOLONE ACETATE 40 MG/ML IJ SUSP
40.0000 mg | Freq: Once | INTRAMUSCULAR | Status: AC
  Administered 2018-01-25: 40 mg via INTRAMUSCULAR

## 2018-01-25 MED ORDER — PREDNISONE 20 MG PO TABS: ORAL_TABLET | ORAL | 1 refills | Status: DC

## 2018-01-25 MED ORDER — ALBUTEROL SULFATE HFA 108 (90 BASE) MCG/ACT IN AERS: 2.0000 | INHALATION_SPRAY | Freq: Four times a day (QID) | RESPIRATORY_TRACT | 2 refills | Status: DC | PRN

## 2018-01-25 NOTE — Patient Instructions (Signed)
Nebulizer with albuterol now.......... 40 mg of Depo-Medrol now  Begin the prednisone 20 mg........ 2 tabs x3 days.....Marland Kitchen or until you are significantly improved alone questions you  Return next Monday for follow-up

## 2018-01-25 NOTE — Progress Notes (Signed)
[name] is a 67 year old married male non-smoker comes in today with a 3-day history of wheezing  On Saturday they mowed the field next to his house which was full of ragweed.  The next day he began wheezing.  He has had this problem in the past.  Review of systems otherwise negative  .vs BP 120/72 (BP Location: Right Arm, Patient Position: Sitting, Cuff Size: Large)   Pulse 93   Temp 98.9 F (37.2 C) (Oral)   Wt 242 lb (109.8 kg)   SpO2 96%   BMI 33.75 kg/m  Well-developed well-nourished male no acute distress vital signs stable he is afebrile HEENT were negative pulmonary exam shows symmetrical breath sounds inspiratory and expiratory wheezing  1.  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 nebulizer with albuterol now......Marland Kitchen prednisone burst and taper... 40 mg of Depo-Medrol IM

## 2018-01-27 DIAGNOSIS — B354 Tinea corporis: Secondary | ICD-10-CM | POA: Diagnosis not present

## 2018-01-31 ENCOUNTER — Ambulatory Visit (INDEPENDENT_AMBULATORY_CARE_PROVIDER_SITE_OTHER): Payer: PPO | Admitting: Family Medicine

## 2018-01-31 ENCOUNTER — Encounter: Payer: Self-pay | Admitting: Family Medicine

## 2018-01-31 VITALS — BP 122/80 | HR 68 | Temp 98.4°F | Wt 246.1 lb

## 2018-01-31 DIAGNOSIS — J45909 Unspecified asthma, uncomplicated: Secondary | ICD-10-CM | POA: Diagnosis not present

## 2018-01-31 MED ORDER — DOXYCYCLINE HYCLATE 100 MG PO TABS: 100.0000 mg | ORAL_TABLET | Freq: Two times a day (BID) | ORAL | 0 refills | Status: DC

## 2018-01-31 NOTE — Progress Notes (Signed)
[name] is a 67 year old male who comes in today for follow-up of asthma  We saw him last Wednesday with a severe asthma attack.  We gave him a shot of Depo-Lupron start on oral prednisone and gave him albuterol inhaler to use 2 puffs 3 times daily.  He comes back today saying he is somewhat better but now is bringing up a lot of yellow-green sputum.  BP 122/80 (BP Location: Right Arm, Patient Position: Sitting, Cuff Size: Large)   Pulse 68   Temp 98.4 F (36.9 C) (Oral)   Wt 246 lb 1.6 oz (111.6 kg)   SpO2 99%   BMI 34.32 kg/m  Well-developed well-nourished male no acute distress vital signs stable he is afebrile pulmonary exam shows symmetrical bilateral wheezing question crackles right base  1.  Asthma with question secondary right lower lobe pneumonia......... start doxycycline 100 mg twice daily continue prednisone follow-up in 1 week

## 2018-01-31 NOTE — Patient Instructions (Signed)
Prednisone 20 mg........... 2 tabs x4 days then decrease to 1 tablet daily until I see you next Monday.  Doxycycline 100 mg,,,,,,,,,,, 1 twice daily for 10 days  Continue the albuterol............ decrease to 2 puffs twice daily  Drink lots of water  Follow-up next Monday

## 2018-02-07 ENCOUNTER — Encounter: Payer: Self-pay | Admitting: Family Medicine

## 2018-02-07 ENCOUNTER — Ambulatory Visit (INDEPENDENT_AMBULATORY_CARE_PROVIDER_SITE_OTHER): Payer: PPO | Admitting: Family Medicine

## 2018-02-07 VITALS — BP 122/70 | HR 93 | Wt 246.0 lb

## 2018-02-07 DIAGNOSIS — J45909 Unspecified asthma, uncomplicated: Secondary | ICD-10-CM

## 2018-02-07 IMAGING — DX DG CHEST 2V
2 series · 2 of 2 positions shown · non-contrast
Comparison: PA and lateral chest x-ray January 26, 2007

CLINICAL DATA: Preoperative exam prior right shoulder surgery this
week. No current chest complaints. History of asthma and bronchitis.
Nonsmoker.

EXAM:
CHEST  2 VIEW

[chest pa]
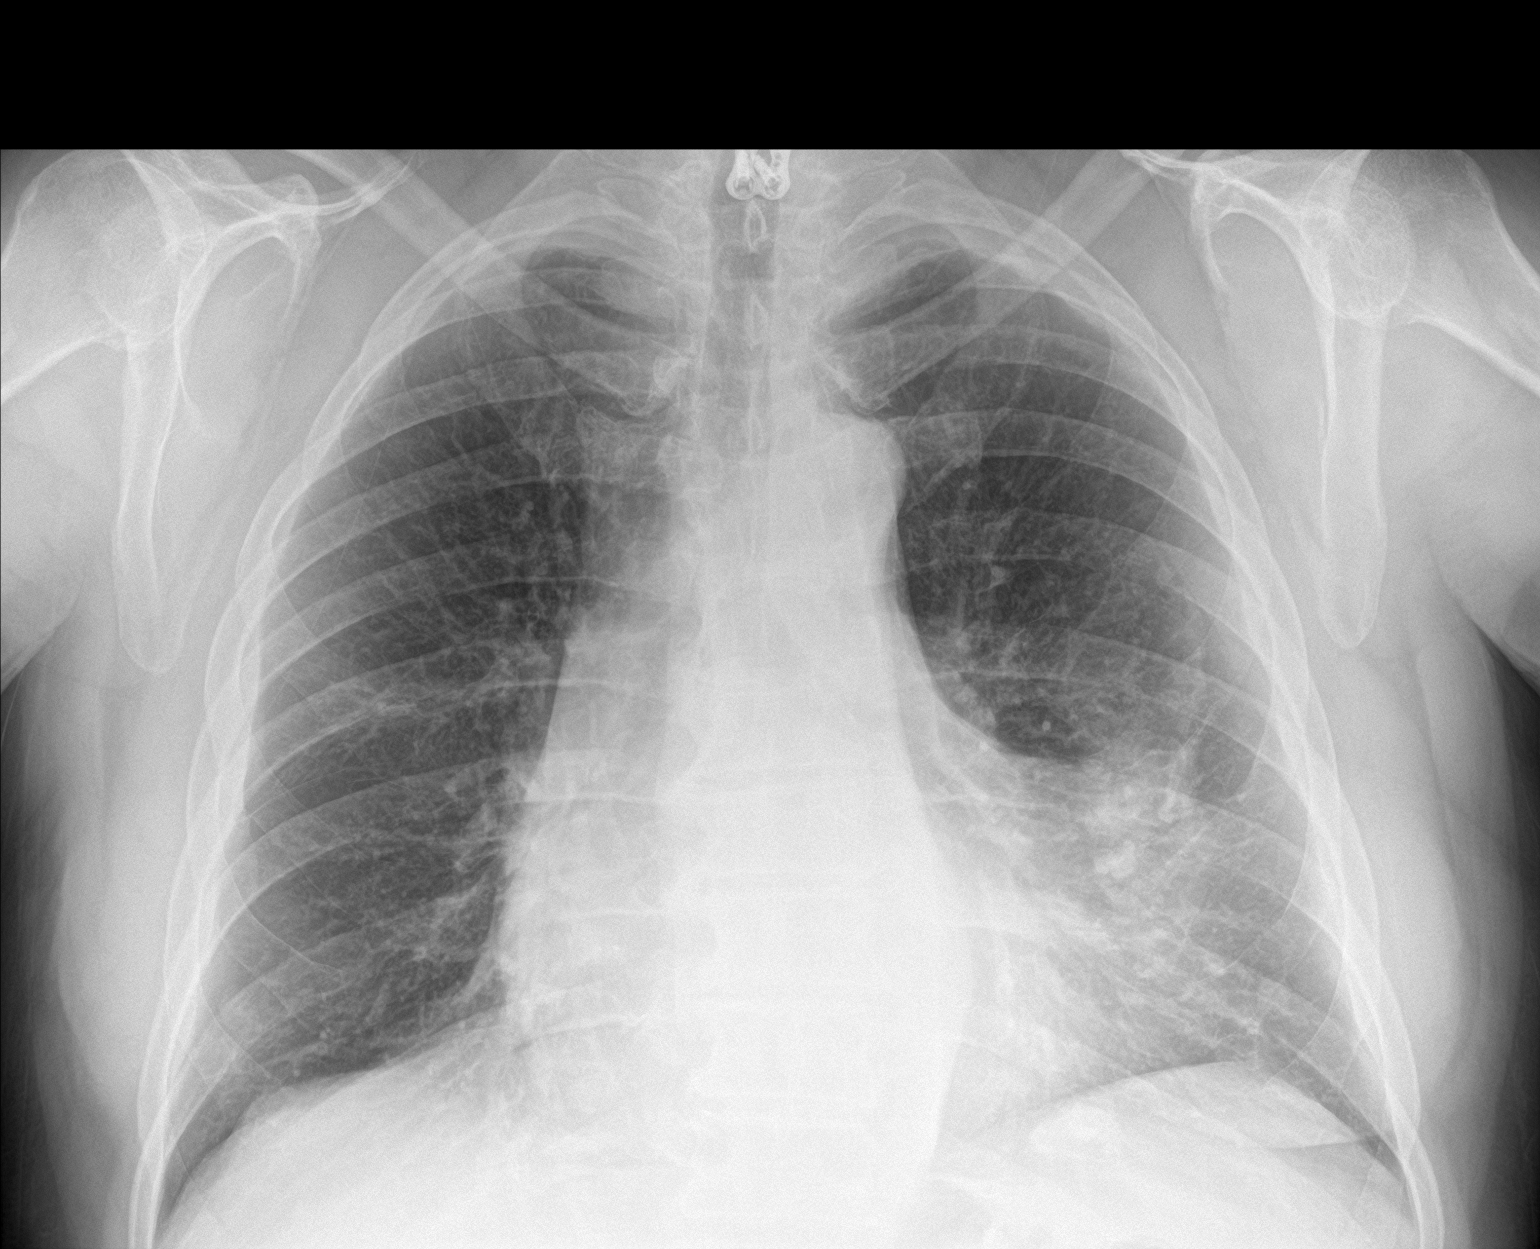

[chest lat]
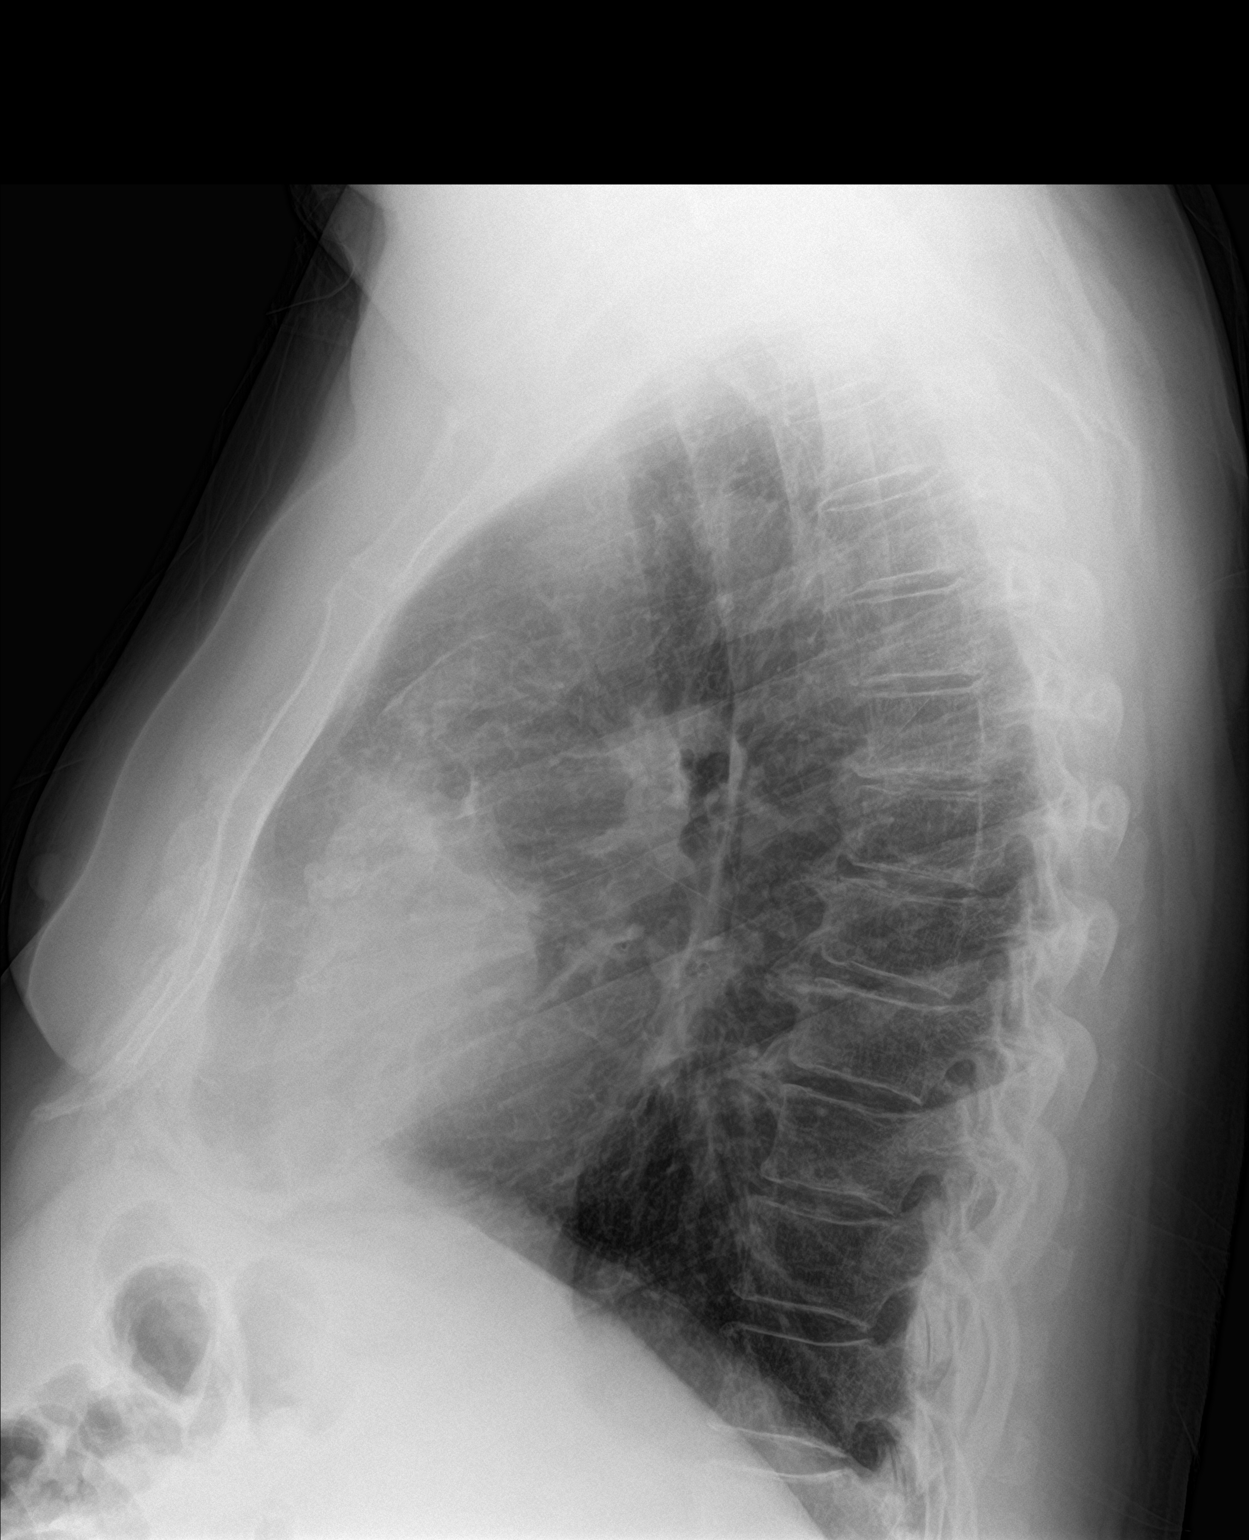

[2 of 2 positions shown; findings below may reference images not displayed]

FINDINGS: There is chronic increased density projecting over the left heart
border anteriorly and along the right lateral thoracic wall
consistent with pleural plaques. These have become slightly more
conspicuous. The heart is normal in size. The pulmonary vascularity
is not engorged. The mediastinum is normal in width. There is
calcification within the wall of the thoracic aorta. The bony thorax
exhibits no acute abnormality.
IMPRESSION: Bilateral pleural plaques slightly more conspicuous since the study
January 2007 which may reflect previous asbestos exposure. No
acute cardiopulmonary abnormality.

Thoracic aortic atherosclerosis.

## 2018-02-07 NOTE — Progress Notes (Signed)
[name] is a delightful 67 year old married male non-smoker who comes in today for follow-up of asthma  Is noted since previous note he developed a viral syndrome which triggered his asthma.  He was started on doxycycline 100 mg twice daily and prednisone 40 mg daily.  He comes in today asymptomatic feeling 75% better  No side effects from medication  .vs BP 122/70 (BP Location: Right Arm, Patient Position: Sitting, Cuff Size: Large)   Pulse 93   Wt 246 lb (111.6 kg)   SpO2 96%   BMI 34.31 kg/m  Well-developed well-nourished male no acute distress vital signs stable is afebrile lungs are clear to auscultation  1.  Asthma resolved plan taper prednisone

## 2018-02-07 NOTE — Patient Instructions (Signed)
Prednisone 20 mg,,,,,,,,,,,,, 1 tab x3 days, half a tab x3 days, then a half a tab Monday Wednesday Friday for a 2-week taper  Return as needed

## 2018-02-08 ENCOUNTER — Ambulatory Visit (INDEPENDENT_AMBULATORY_CARE_PROVIDER_SITE_OTHER): Payer: PPO | Admitting: *Deleted

## 2018-02-08 DIAGNOSIS — Z23 Encounter for immunization: Secondary | ICD-10-CM | POA: Diagnosis not present

## 2018-03-29 DIAGNOSIS — M1711 Unilateral primary osteoarthritis, right knee: Secondary | ICD-10-CM | POA: Diagnosis not present

## 2018-04-26 ENCOUNTER — Ambulatory Visit (INDEPENDENT_AMBULATORY_CARE_PROVIDER_SITE_OTHER): Payer: PPO | Admitting: Family Medicine

## 2018-04-26 ENCOUNTER — Encounter: Payer: Self-pay | Admitting: Family Medicine

## 2018-04-26 VITALS — BP 122/80 | HR 93 | Temp 98.4°F | Resp 12 | Ht 71.0 in | Wt 248.2 lb

## 2018-04-26 DIAGNOSIS — J069 Acute upper respiratory infection, unspecified: Secondary | ICD-10-CM | POA: Diagnosis not present

## 2018-04-26 DIAGNOSIS — J301 Allergic rhinitis due to pollen: Secondary | ICD-10-CM | POA: Diagnosis not present

## 2018-04-26 LAB — POCT INFLUENZA A/B
Influenza A, POC: NEGATIVE
Influenza B, POC: NEGATIVE

## 2018-04-26 MED ORDER — FLUTICASONE PROPIONATE 50 MCG/ACT NA SUSP: 1.0000 | Freq: Two times a day (BID) | NASAL | 3 refills | Status: DC

## 2018-04-26 NOTE — Patient Instructions (Addendum)
A few things to remember from today's visit:   URI, acute  Allergic rhinitis due to pollen, unspecified seasonality - Plan: fluticasone (FLONASE) 50 MCG/ACT nasal spray viral infections are self-limited and we treat each symptom depending of severity.  Over the counter medications as decongestants and cold medications usually help, they need to be taken with caution if there is a history of high blood pressure or palpitations. Tylenol and/or Ibuprofen also helps with most symptoms (headache, muscle aching, fever,etc) Plenty of fluids. Honey helps with cough. Steam inhalations helps with runny nose, nasal congestion, and may prevent sinus infections.  Please follow in not any better in 1-2 weeks or if symptoms get worse.   Please be sure medication list is accurate. If a new problem present, please set up appointment sooner than planned today.

## 2018-04-26 NOTE — Progress Notes (Signed)
ACUTE VISIT  HPI:  Chief Complaint  Patient presents with  . Cough    sx started Saturday  . Nasal Congestion  . Sinus pressure    started 2 days ago    [name] is a 67 y.o.male here today complaining of 2-3 days of respiratory symptoms. Having nasal congestion, rhinorrhea, and postnasal drainage. "Little" of sore throat in the morning. He has not identified exacerbating or alleviating factors.  Denies fever, chills, body aches. He was fatigued until yesterday. Negative for cough, wheezing, or dyspnea. Frontal pressure headache, mild.  No associated nausea, vomiting, or visual changes.   URI   This is a new problem. The current episode started in the past 7 days. The problem has been gradually improving. There has been no fever. Associated symptoms include congestion, headaches (Frontal pressure headache), rhinorrhea and a sore throat. Pertinent negatives include no abdominal pain, coughing, diarrhea, ear pain, nausea, plugged ear sensation, rash, vomiting or wheezing. He has tried decongestant for the symptoms. The treatment provided moderate relief.   No Hx of recent travel. His wife was sick with similar symptoms. No known insect bite.  Hx of allergies: History of allergy rhinitis, currently he is not on treatment.  OTC medications for this problem: Sudafed.  He is planning on visiting his father, who is in a nursing home, so he wants to be sure he is not contagious. He is also concerned because he usually develops "pneumonia very easy" when he has had URI in the past.  Review of Systems  Constitutional: Positive for activity change and fatigue. Negative for appetite change, chills and fever.  HENT: Positive for congestion, postnasal drip, rhinorrhea, sinus pressure and sore throat. Negative for ear pain, mouth sores and voice change.   Eyes: Negative for photophobia, discharge and redness.  Respiratory: Negative for cough, shortness of breath and  wheezing.   Gastrointestinal: Negative for abdominal pain, diarrhea, nausea and vomiting.  Musculoskeletal: Negative for gait problem and myalgias.  Skin: Negative for rash.  Allergic/Immunologic: Positive for environmental allergies.  Neurological: Positive for headaches (Frontal pressure headache). Negative for syncope and weakness.  Hematological: Negative for adenopathy. Does not bruise/bleed easily.      Current Outpatient Medications on File Prior to Visit  Medication Sig Dispense Refill  . albuterol (PROAIR HFA) 108 (90 Base) MCG/ACT inhaler ProAir HFA 90 mcg/actuation aerosol inhaler  TAKE 2 PUFFS BY MOUTH EVERY 6 HOURS AS NEEDED FOR WHEEZE OR SHORTNESS OF BREATH    . COCONUT OIL PO Take 1 capsule by mouth daily.     . Cyanocobalamin (VITAMIN B-12) 5000 MCG TBDP Take 5,000 mcg by mouth daily.     Current Facility-Administered Medications on File Prior to Visit  Medication Dose Route Frequency Provider Last Rate Last Dose  . 0.9 %  sodium chloride infusion  500 mL Intravenous Continuous Ladene Artist, MD         Past Medical History:  Diagnosis Date  . Anxiety   . Arthritis   . Asthma   . At risk for sleep apnea    STOP-BANG = 4  SENT TO PCP 05-24-2013  . Eczema   . Edema of foot   . Finger wound, simple, open    left middle index finger - stitches and dressing occured on 09/22/2016- followed by Dr Daws at [facility]   . H/O hiatal hernia   . History of kidney stones   . HOH (hard of hearing)   .  Hyperlipidemia   . Influenza A   . Influenza B    symptoms started 05-16-2013/  dx 05-17-2013 by pcp  . Left ankle injury    twisted left ankle   . Left ureteral calculus   . Pneumonia    hx of   . Rash    right leg- calf -   . Wears dentures    bottom   Allergies  Allergen Reactions  . Sesame Oil Itching and Swelling  . Tobramycin Itching and Swelling    Used in an eye oint-reaction    Social History   Socioeconomic History  . Marital status: Married     Spouse name: Not on file  . Number of children: 2  . Years of education: Not on file  . Highest education level: Not on file  Occupational History  . Occupation: [employer]  . Financial resource strain: Not on file  . Food insecurity:    Worry: Not on file    Inability: Not on file  . Transportation needs:    Medical: Not on file    Non-medical: Not on file  Tobacco Use  . Smoking status: Never Smoker  . Smokeless tobacco: Never Used  Substance and Sexual Activity  . Alcohol use: Yes    Alcohol/week: 7.0 standard drinks    Types: 7 Cans of beer per week    Comment: beer- 2 beers day sometimes   . Drug use: No  . Sexual activity: Not on file  Lifestyle  . Physical activity:    Days per week: Not on file    Minutes per session: Not on file  . Stress: Not on file  Relationships  . Social connections:    Talks on phone: Not on file    Gets together: Not on file    Attends religious service: Not on file    Active member of club or organization: Not on file    Attends meetings of clubs or organizations: Not on file    Relationship status: Not on file  Other Topics Concern  . Not on file  Social History Narrative  . Not on file    Vitals:   04/26/18 0703  BP: 122/80  Pulse: 93  Resp: 12  Temp: 98.4 F (36.9 C)  SpO2: 97%   Body mass index is 34.62 kg/m.   Physical Exam  Nursing note and vitals reviewed. Constitutional: He is oriented to person, place, and time. He appears well-developed. He does not appear ill. No distress.  HENT:  Head: Normocephalic and atraumatic.  Right Ear: External ear and ear canal normal. Decreased hearing is noted.  Left Ear: External ear and ear canal normal. Decreased hearing is noted.  Nose: Rhinorrhea present. Right sinus exhibits no maxillary sinus tenderness and no frontal sinus tenderness. Left sinus exhibits no maxillary sinus tenderness and no frontal sinus tenderness.  Mouth/Throat: Oropharynx  is clear and moist and mucous membranes are normal.  Hearing aids bilaterally. TM with scarring changes, L>R. Nasal voice. Postnasal drainage.  Eyes: Conjunctivae are normal.  Cardiovascular: Normal rate and regular rhythm.  No murmur heard. Respiratory: Effort normal and breath sounds normal. No stridor. No respiratory distress.  Lymphadenopathy:       Head (right side): No submandibular adenopathy present.       Head (left side): No submandibular adenopathy present.    He has no cervical adenopathy.  Neurological: He is alert and oriented to person, place, and time. He has  normal strength. Gait normal.  Skin: Skin is warm. No rash noted. No erythema.  Psychiatric: He has a normal mood and affect.  Well groomed, good eye contact.    ASSESSMENT AND PLAN:  Mr. [name] was seen today for cough, nasal congestion and sinus pressure.  Diagnoses and all orders for this visit:  URI, acute Rapid flu test here in the office negative. Symptoms suggests a viral etiology, I explained patient that symptomatic treatment is recommended in this case, so I do not think abx is needed at this time. Instructed to monitor for signs of complications, including new onset of fever among some, clearly instructed about warning signs. I also explained that he may be developing cough in the next few days. Nasal congestion can last a few days and sometimes weeks. F/U as needed.  -     POC Influenza A/B  Allergic rhinitis due to pollen, unspecified seasonality This problem could be aggravating symptoms. Short course of prednisone may help. He states that he has prednisone at home, which he has taken for years for joint pain.  Educated about side effects and indications for prednisone use, recommend prednisone 40 mg daily with breakfast for 3 days.  -     fluticasone (FLONASE) 50 MCG/ACT nasal spray; Place 1 spray into both nostrils 2 (two) times daily.   Instructed patient to avoid prednisone use for  joint pain, OA. He is positive prednisone he has at home is not expired, he will let me know if he needs a prescription. He is not interested in having CXR today.    Betty G. Martinique, MD  [facility]. [place] office.

## 2018-05-06 DIAGNOSIS — M25561 Pain in right knee: Secondary | ICD-10-CM | POA: Diagnosis not present

## 2018-05-06 DIAGNOSIS — M1711 Unilateral primary osteoarthritis, right knee: Secondary | ICD-10-CM | POA: Diagnosis not present

## 2018-05-12 DIAGNOSIS — M1711 Unilateral primary osteoarthritis, right knee: Secondary | ICD-10-CM | POA: Diagnosis not present

## 2018-05-16 DIAGNOSIS — S83241A Other tear of medial meniscus, current injury, right knee, initial encounter: Secondary | ICD-10-CM | POA: Diagnosis not present

## 2018-06-01 DIAGNOSIS — S83241A Other tear of medial meniscus, current injury, right knee, initial encounter: Secondary | ICD-10-CM | POA: Diagnosis not present

## 2018-06-01 DIAGNOSIS — S83281A Other tear of lateral meniscus, current injury, right knee, initial encounter: Secondary | ICD-10-CM | POA: Diagnosis not present

## 2018-06-01 DIAGNOSIS — M1711 Unilateral primary osteoarthritis, right knee: Secondary | ICD-10-CM | POA: Diagnosis not present

## 2018-06-01 DIAGNOSIS — Y999 Unspecified external cause status: Secondary | ICD-10-CM | POA: Diagnosis not present

## 2018-06-01 DIAGNOSIS — G8918 Other acute postprocedural pain: Secondary | ICD-10-CM | POA: Diagnosis not present

## 2018-06-01 DIAGNOSIS — S83231A Complex tear of medial meniscus, current injury, right knee, initial encounter: Secondary | ICD-10-CM | POA: Diagnosis not present

## 2018-06-01 DIAGNOSIS — X58XXXA Exposure to other specified factors, initial encounter: Secondary | ICD-10-CM | POA: Diagnosis not present

## 2018-07-25 DIAGNOSIS — M1711 Unilateral primary osteoarthritis, right knee: Secondary | ICD-10-CM | POA: Diagnosis not present

## 2018-07-25 DIAGNOSIS — M1712 Unilateral primary osteoarthritis, left knee: Secondary | ICD-10-CM | POA: Diagnosis not present

## 2018-07-25 DIAGNOSIS — Z5189 Encounter for other specified aftercare: Secondary | ICD-10-CM | POA: Diagnosis not present

## 2018-07-25 DIAGNOSIS — M170 Bilateral primary osteoarthritis of knee: Secondary | ICD-10-CM | POA: Diagnosis not present

## 2018-08-09 ENCOUNTER — Ambulatory Visit (INDEPENDENT_AMBULATORY_CARE_PROVIDER_SITE_OTHER): Payer: PPO | Admitting: Internal Medicine

## 2018-08-09 ENCOUNTER — Other Ambulatory Visit: Payer: Self-pay

## 2018-08-09 DIAGNOSIS — E7800 Pure hypercholesterolemia, unspecified: Secondary | ICD-10-CM | POA: Diagnosis not present

## 2018-08-09 DIAGNOSIS — J45909 Unspecified asthma, uncomplicated: Secondary | ICD-10-CM | POA: Diagnosis not present

## 2018-08-09 NOTE — Progress Notes (Signed)
Virtual Visit via Video Note  I connected with [name] on 08/09/18 at  2:00 PM EDT by a video enabled telemedicine application and verified that I am speaking with the correct person using two identifiers.  Location patient: home Location provider: work office Persons participating in the virtual visit: patient, provider  I discussed the limitations of evaluation and management by telemedicine and the availability of in person appointments. The patient expressed understanding and agreed to proceed.   HPI: This visit is to establish care. He has no acute complaints today. He has mild asthma with maybe 1-2 flare ups a year requiring prednisone. He uses PRN albuterol infrequently. He has mild HLD not on meds. He had right knee arthroscopic meniscus repair in February and is recovering well from this. He is due for CPE. He is a non smoker.   ROS: Constitutional: Denies fever, chills, diaphoresis, appetite change and fatigue.  HEENT: Denies photophobia, eye pain, redness, hearing loss, ear pain, congestion, sore throat, rhinorrhea, sneezing, mouth sores, trouble swallowing, neck pain, neck stiffness and tinnitus.   Respiratory: Denies SOB, DOE, cough, chest tightness,  and wheezing.   Cardiovascular: Denies chest pain, palpitations and leg swelling.  Gastrointestinal: Denies nausea, vomiting, abdominal pain, diarrhea, constipation, blood in stool and abdominal distention.  Genitourinary: Denies dysuria, urgency, frequency, hematuria, flank pain and difficulty urinating.  Endocrine: Denies: hot or cold intolerance, sweats, changes in hair or nails, polyuria, polydipsia. Musculoskeletal: Denies myalgias, back pain, joint swelling, arthralgias and gait problem.  Skin: Denies pallor, rash and wound.  Neurological: Denies dizziness, seizures, syncope, weakness, light-headedness, numbness and headaches.  Hematological: Denies adenopathy. Easy bruising, personal or family bleeding history   Psychiatric/Behavioral: Denies suicidal ideation, mood changes, confusion, nervousness, sleep disturbance and agitation   Past Medical History:  Diagnosis Date  . Anxiety   . Arthritis   . Asthma   . At risk for sleep apnea    STOP-BANG = 4  SENT TO PCP 05-24-2013  . Eczema   . Edema of foot   . Finger wound, simple, open    left middle index finger - stitches and dressing occured on 09/22/2016- followed by Dr Daws at [facility]   . H/O hiatal hernia   . History of kidney stones   . HOH (hard of hearing)   . Hyperlipidemia   . Influenza A   . Influenza B    symptoms started 05-16-2013/  dx 05-17-2013 by pcp  . Left ankle injury    twisted left ankle   . Left ureteral calculus   . Pneumonia    hx of   . Rash    right leg- calf -   . Wears dentures    bottom    Past Surgical History:  Procedure Laterality Date  . ANTERIOR CERVICAL DECOMP/DISCECTOMY FUSION  01-27-2007   C6  --- C7  . COLONOSCOPY    . CYSTOSCOPY WITH URETEROSCOPY Left 05/26/2013   Procedure: CYSTOSCOPY WITH URETEROSCOPY, URETHERAL DILATATION,LEFT RETROGRADE, LASER LITHOTRIPSY, STENT PLACMENT, LEFT URETEROSCOPY;  Surgeon: Ailene Rud, MD;  Location: [facility];  Service: Urology;  Laterality: Left;  . FINGER FRACTURE SURGERY  AS CHILD  . HOLMIUM LASER APPLICATION Left 6/83/4196   Procedure: HOLMIUM LASER APPLICATION;  Surgeon: Ailene Rud, MD;  Location: [facility];  Service: Urology;  Laterality: Left;  . LIPOMA EXCISION  05/17/2012   Procedure: EXCISION LIPOMA;  Surgeon: Gayland Curry, MD,FACS;  Location: [place];  Service: General;  Laterality: Left;  incisional biopsy of left shoulder soft tissue mass  . NASAL FRACTURE SURGERY  AS CHILD  . ORIF ULNAR FRACTURE Right AS CHILD  . ROTATOR CUFF REPAIR     right shoulder [id number] by Dibble    Family History  Problem Relation Age of Onset  . Arthritis Other    . Hypertension Other   . Osteoporosis Other   . Heart disease Other   . Lung disease Other   . Prostate cancer Father   . Heart disease Sister   . Colon cancer Neg Hx   . Colon polyps Neg Hx   . Esophageal cancer Neg Hx   . Kidney disease Neg Hx   . Rectal cancer Neg Hx   . Stomach cancer Neg Hx     SOCIAL HX:   reports that he has never smoked. He has never used smokeless tobacco. He reports current alcohol use of about 7.0 standard drinks of alcohol per week. He reports that he does not use drugs.   Current Outpatient Medications:  .  albuterol (PROAIR HFA) 108 (90 Base) MCG/ACT inhaler, ProAir HFA 90 mcg/actuation aerosol inhaler  TAKE 2 PUFFS BY MOUTH EVERY 6 HOURS AS NEEDED FOR WHEEZE OR SHORTNESS OF BREATH, Disp: , Rfl:  .  COCONUT OIL PO, Take 1 capsule by mouth daily. , Disp: , Rfl:  .  Cyanocobalamin (VITAMIN B-12) 5000 MCG TBDP, Take 5,000 mcg by mouth daily., Disp: , Rfl:  .  fluticasone (FLONASE) 50 MCG/ACT nasal spray, Place 1 spray into both nostrils 2 (two) times daily., Disp: 16 g, Rfl: 3  Current Facility-Administered Medications:  .  0.9 %  sodium chloride infusion, 500 mL, Intravenous, Continuous, Fuller Plan, Pricilla Riffle, MD  EXAM:   VITALS per patient if applicable: none reported  GENERAL: alert, oriented, appears well and in no acute distress  HEENT: atraumatic, conjunttiva clear, no obvious abnormalities on inspection of external nose and ears  NECK: normal movements of the head and neck  LUNGS: on inspection no signs of respiratory distress, breathing rate appears normal, no obvious gross increased work of breathing, gasping or wheezing  CV: no obvious cyanosis  MS: moves all visible extremities without noticeable abnormality  PSYCH/NEURO: pleasant and cooperative, no obvious depression or anxiety, speech and thought processing grossly intact  ASSESSMENT AND PLAN:   Moderate asthma without complication, unspecified whether persistent -Continue PRN  albuterol. -Will notify us if/when he has any issues.  Pure hypercholesterolemia -Not on meds. -LDL 153 in 11/18.  Will schedule visit for CPE over the summer.     I discussed the assessment and treatment plan with the patient. The patient was provided an opportunity to ask questions and all were answered. The patient agreed with the plan and demonstrated an understanding of the instructions.   The patient was advised to call back or seek an in-person evaluation if the symptoms worsen or if the condition fails to improve as anticipated.    Lelon Frohlich, MD  [facility] at [facility]

## 2018-08-29 DIAGNOSIS — M1711 Unilateral primary osteoarthritis, right knee: Secondary | ICD-10-CM | POA: Diagnosis not present

## 2018-08-30 ENCOUNTER — Telehealth: Payer: Self-pay | Admitting: *Deleted

## 2018-08-30 NOTE — Telephone Encounter (Signed)
Called to inform pt AWV would need to be virtual or r/s for July. Message was left for pt to call back

## 2018-09-07 ENCOUNTER — Ambulatory Visit: Payer: PPO

## 2018-09-08 DIAGNOSIS — M1711 Unilateral primary osteoarthritis, right knee: Secondary | ICD-10-CM | POA: Diagnosis not present

## 2018-09-15 DIAGNOSIS — M1711 Unilateral primary osteoarthritis, right knee: Secondary | ICD-10-CM | POA: Diagnosis not present

## 2018-09-22 DIAGNOSIS — M1711 Unilateral primary osteoarthritis, right knee: Secondary | ICD-10-CM | POA: Diagnosis not present

## 2018-10-04 ENCOUNTER — Ambulatory Visit (HOSPITAL_COMMUNITY)
Admission: RE | Admit: 2018-10-04 | Discharge: 2018-10-04 | Disposition: A | Discharge status: Home / Self Care | Payer: PPO | Source: Ambulatory Visit | Attending: Internal Medicine | Admitting: Internal Medicine

## 2018-10-04 ENCOUNTER — Other Ambulatory Visit (HOSPITAL_COMMUNITY): Payer: Self-pay | Admitting: Orthopedic Surgery

## 2018-10-04 ENCOUNTER — Other Ambulatory Visit: Payer: Self-pay

## 2018-10-04 DIAGNOSIS — M79604 Pain in right leg: Secondary | ICD-10-CM | POA: Diagnosis not present

## 2018-10-04 DIAGNOSIS — M79605 Pain in left leg: Secondary | ICD-10-CM | POA: Diagnosis not present

## 2018-10-04 DIAGNOSIS — M7989 Other specified soft tissue disorders: Secondary | ICD-10-CM | POA: Insufficient documentation

## 2018-10-04 DIAGNOSIS — M79662 Pain in left lower leg: Secondary | ICD-10-CM | POA: Diagnosis not present

## 2018-10-04 DIAGNOSIS — M79661 Pain in right lower leg: Secondary | ICD-10-CM | POA: Diagnosis not present

## 2018-10-04 DIAGNOSIS — M1711 Unilateral primary osteoarthritis, right knee: Secondary | ICD-10-CM | POA: Diagnosis not present

## 2018-10-18 DIAGNOSIS — M1711 Unilateral primary osteoarthritis, right knee: Secondary | ICD-10-CM | POA: Diagnosis not present

## 2018-10-27 DIAGNOSIS — M1711 Unilateral primary osteoarthritis, right knee: Secondary | ICD-10-CM | POA: Diagnosis not present

## 2018-11-03 DIAGNOSIS — M25561 Pain in right knee: Secondary | ICD-10-CM | POA: Diagnosis not present

## 2018-11-03 DIAGNOSIS — M1711 Unilateral primary osteoarthritis, right knee: Secondary | ICD-10-CM | POA: Diagnosis not present

## 2018-11-14 NOTE — H&P (Signed)
TOTAL KNEE ADMISSION H&P  Patient is being admitted for right total knee arthroplasty.  Subjective:  Chief Complaint:right knee pain.  HPI: [name], 68 y.o. male, has a history of pain and functional disability in the right knee due to arthritis and has failed non-surgical conservative treatments for greater than 12 weeks to includeNSAID's and/or analgesics, corticosteriod injections and viscosupplementation injections.  Onset of symptoms was gradual, starting 2 years ago with gradually worsening course since that time. The patient noted prior procedures on the knee to include  arthroscopy and menisectomy on the right knee(s).  Patient currently rates pain in the right knee(s) at 7 out of 10 with activity. Patient has night pain and pain that interferes with activities of daily living.  Patient has evidence of bone-on-bone osteoarthritis within the medial compartment with minimal varus deformity by imaging studies. There is no active infection.  Patient Active Problem List   Diagnosis Date Noted  . Moderate asthma without complication 42/87/6811  . Eczema of lower leg 08/16/2017  . Ganglion cyst of dorsum of left wrist 08/16/2017  . Routine general medical examination at a health care facility 08/23/2014  . Pure hypercholesterolemia 04/05/2013  . 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 Kidney stone on right side 02/29/2012  . NOISE-INDUCED HEARING LOSS 12/17/2008  . DYSHIDROSIS 12/08/2007  . NEPHROLITHIASIS, HX OF 11/10/2007  . Allergic rhinitis 06/24/2007   Past Medical History:  Diagnosis Date  . Anxiety   . Arthritis   . Asthma   . At risk for sleep apnea    STOP-BANG = 4  SENT TO PCP 05-24-2013  . Eczema   . Edema of foot   . Finger wound, simple, open    left middle index finger - stitches and dressing occured on 09/22/2016- followed by Dr Daws at [facility]   . H/O hiatal hernia   . History of kidney stones   . HOH (hard of hearing)   . Hyperlipidemia    . Influenza A   . Influenza B    symptoms started 05-16-2013/  dx 05-17-2013 by pcp  . Left ankle injury    twisted left ankle   . Left ureteral calculus   . Pneumonia    hx of   . Rash    right leg- calf -   . Wears dentures    bottom    Past Surgical History:  Procedure Laterality Date  . ANTERIOR CERVICAL DECOMP/DISCECTOMY FUSION  01-27-2007   C6  --- C7  . COLONOSCOPY    . CYSTOSCOPY WITH URETEROSCOPY Left 05/26/2013   Procedure: CYSTOSCOPY WITH URETEROSCOPY, URETHERAL DILATATION,LEFT RETROGRADE, LASER LITHOTRIPSY, STENT PLACMENT, LEFT URETEROSCOPY;  Surgeon: Ailene Rud, MD;  Location[facility];  Service: Urology;  Laterality: Left;  . FINGER FRACTURE SURGERY  AS CHILD  . HOLMIUM LASER APPLICATION Left 5/72/6203   Procedure: HOLMIUM LASER APPLICATION;  Surgeon: Ailene Rud, MD;  Location: [facility];  Service: Urology;  Laterality: Left;  . LIPOMA EXCISION  05/17/2012   Procedure: EXCISION LIPOMA;  Surgeon: Gayland Curry, MD,FACS;  Location: [facility];  Service: General;  Laterality: Left;  incisional biopsy of left shoulder soft tissue mass  . NASAL FRACTURE SURGERY  AS CHILD  . ORIF ULNAR FRACTURE Right AS CHILD  . ROTATOR CUFF REPAIR     right shoulder [id number] by [place]    Current Facility-Administered Medications  Medication Dose Route Frequency Provider Last Rate  Last Dose  . 0.9 %  sodium chloride infusion  500 mL Intravenous Continuous Ladene Artist, MD       Current Outpatient Medications  Medication Sig Dispense Refill Last Dose  . albuterol (PROAIR HFA) 108 (90 Base) MCG/ACT inhaler ProAir HFA 90 mcg/actuation aerosol inhaler  TAKE 2 PUFFS BY MOUTH EVERY 6 HOURS AS NEEDED FOR WHEEZE OR SHORTNESS OF BREATH   Taking  . COCONUT OIL PO Take 1 capsule by mouth daily.    Taking  . Cyanocobalamin (VITAMIN B-12) 5000 MCG TBDP Take 5,000 mcg by mouth daily.   Taking  .  fluticasone (FLONASE) 50 MCG/ACT nasal spray Place 1 spray into both nostrils 2 (two) times daily. 16 g 3    Allergies  Allergen Reactions  . Sesame Oil Itching and Swelling  . Tobramycin Itching and Swelling    Used in an eye oint-reaction    Social History   Tobacco Use  . Smoking status: Never Smoker  . Smokeless tobacco: Never Used  Substance Use Topics  . Alcohol use: Yes    Alcohol/week: 7.0 standard drinks    Types: 7 Cans of beer per week    Comment: beer- 2 beers day sometimes     Family History  Problem Relation Age of Onset  . Arthritis Other   . Hypertension Other   . Osteoporosis Other   . Heart disease Other   . Lung disease Other   . Prostate cancer Father   . Heart disease Sister   . Colon cancer Neg Hx   . Colon polyps Neg Hx   . Esophageal cancer Neg Hx   . Kidney disease Neg Hx   . Rectal cancer Neg Hx   . Stomach cancer Neg Hx      Review of Systems  Constitutional: Negative for chills and fever.  Respiratory: Negative for cough and shortness of breath.   Cardiovascular: Negative for chest pain and palpitations.  Gastrointestinal: Negative for nausea and vomiting.  Musculoskeletal: Positive for joint pain.  Neurological: Negative for tingling and sensory change.    Objective:  Physical Exam Patient is a 68 year old male.  Well nourished and well developed. General: Alert and oriented x3, cooperative and pleasant, no acute distress. Head: normocephalic, atraumatic, neck supple. Eyes: EOMI. Respiratory: breath sounds clear in all fields, no wheezing, rales, or rhonchi. Cardiovascular: Regular rate and rhythm, no murmurs, gallops or rubs. Abdomen: non-tender to palpation and soft, normoactive bowel sounds.  Musculoskeletal: Right Knee Exam: Moderate effusion. Range of motion is 5 to 120. Minimal lateral joint line tenderness. Positive medial joint line tenderness. Knee is stable.  Calves soft and nontender. Motor function intact in  LE. Strength 5/5 LE bilaterally. Neuro: Distal pulses 2+. Sensation to light touch intact in LE.  Vital signs in last 24 hours: BP: 142/92 mmHg  Labs:  Estimated body mass index is 34.62 kg/m as calculated from the following:   Height as of 04/26/18: 5\' 11"  (1.803 m).   Weight as of 04/26/18: 112.6 kg.   Imaging Review Plain radiographs demonstrate severe degenerative joint disease of the right knee(s). The overall alignment ismild varus. The bone quality appears to be adequate for age and reported activity level.  Assessment/Plan:  End stage arthritis, right knee   The patient history, physical examination, clinical judgment of the provider and imaging studies are consistent with end stage degenerative joint disease of the right knee(s) and total knee arthroplasty is deemed medically necessary. The treatment options including medical management,  injection therapy arthroscopy and arthroplasty were discussed at length. The risks and benefits of total knee arthroplasty were presented and reviewed. The risks due to aseptic loosening, infection, stiffness, patella tracking problems, thromboembolic complications and other imponderables were discussed. The patient acknowledged the explanation, agreed to proceed with the plan and consent was signed. Patient is being admitted for inpatient treatment for surgery, pain control, PT, OT, prophylactic antibiotics, VTE prophylaxis, progressive ambulation and ADL's and discharge planning. The patient is planning to be discharged home.   Therapy Plans: outpatient therapy at [facility] PT Disposition: Home with wife Planned DVT Prophylaxis: aspirin 325mg  BID DME needed: none PCP: Dr. Jerilee Hoh TXA: IV Allergies: sesame oil - anaphylaxis, NKDA Anesthesia Concerns: none BMI: 36.4  Other: Patient wishes to have HHPT prior to OPPT.  Patient's anticipated LOS is less than 2 midnights, meeting these requirements: - Lives within 1 hour of care - Has a  competent adult at home to recover with post-op recover - NO history of  - Chronic pain requiring opiods  - Diabetes  - Coronary Artery Disease  - Heart failure  - Heart attack  - Stroke  - DVT/VTE  - Cardiac arrhythmia  - Respiratory Failure/COPD  - Renal failure  - Anemia  - Advanced Liver disease  - Patient was instructed on what medications to stop prior to surgery. - Follow-up visit in 2 weeks with Dr. Wynelle Link - Begin physical therapy following surgery - Pre-operative lab work as pre-surgical testing - Prescriptions will be provided in hospital at time of discharge  Griffith Citron, PA-C Orthopedic Surgery [facility] [place]

## 2018-11-18 ENCOUNTER — Ambulatory Visit (INDEPENDENT_AMBULATORY_CARE_PROVIDER_SITE_OTHER): Payer: PPO | Admitting: Internal Medicine

## 2018-11-18 ENCOUNTER — Other Ambulatory Visit: Payer: Self-pay

## 2018-11-18 ENCOUNTER — Encounter: Payer: Self-pay | Admitting: Internal Medicine

## 2018-11-18 VITALS — BP 110/80 | HR 87 | Temp 98.1°F | Ht 71.0 in | Wt 252.9 lb

## 2018-11-18 DIAGNOSIS — J45909 Unspecified asthma, uncomplicated: Secondary | ICD-10-CM | POA: Diagnosis not present

## 2018-11-18 DIAGNOSIS — M67432 Ganglion, left wrist: Secondary | ICD-10-CM | POA: Diagnosis not present

## 2018-11-18 DIAGNOSIS — E7800 Pure hypercholesterolemia, unspecified: Secondary | ICD-10-CM | POA: Diagnosis not present

## 2018-11-18 DIAGNOSIS — Z01818 Encounter for other preprocedural examination: Secondary | ICD-10-CM | POA: Diagnosis not present

## 2018-11-18 DIAGNOSIS — Z0000 Encounter for general adult medical examination without abnormal findings: Secondary | ICD-10-CM | POA: Diagnosis not present

## 2018-11-18 NOTE — Patient Instructions (Addendum)
-Nice seeing you today!!  -Lab work today; will notify you once results are available.  -OK to proceed to knee surgery without further work up.   Preventive Care 65 Years and Older, Male Preventive care refers to lifestyle choices and visits with your health care provider that can promote health and wellness. This includes:  A yearly physical exam. This is also called an annual well check.  Regular dental and eye exams.  Immunizations.  Screening for certain conditions.  Healthy lifestyle choices, such as diet and exercise. What can I expect for my preventive care visit? Physical exam Your health care provider will check:  Height and weight. These may be used to calculate body mass index (BMI), which is a measurement that tells if you are at a healthy weight.  Heart rate and blood pressure.  Your skin for abnormal spots. Counseling Your health care provider may ask you questions about:  Alcohol, tobacco, and drug use.  Emotional well-being.  Home and relationship well-being.  Sexual activity.  Eating habits.  History of falls.  Memory and ability to understand (cognition).  Work and work Statistician. What immunizations do I need?  Influenza (flu) vaccine  This is recommended every year. Tetanus, diphtheria, and pertussis (Tdap) vaccine  You may need a Td booster every 10 years. Varicella (chickenpox) vaccine  You may need this vaccine if you have not already been vaccinated. Zoster (shingles) vaccine  You may need this after age 50. Pneumococcal conjugate (PCV13) vaccine  One dose is recommended after age 51. Pneumococcal polysaccharide (PPSV23) vaccine  One dose is recommended after age 40. Measles, mumps, and rubella (MMR) vaccine  You may need at least one dose of MMR if you were born in 1957 or later. You may also need a second dose. Meningococcal conjugate (MenACWY) vaccine  You may need this if you have certain conditions. Hepatitis A  vaccine  You may need this if you have certain conditions or if you travel or work in places where you may be exposed to hepatitis A. Hepatitis B vaccine  You may need this if you have certain conditions or if you travel or work in places where you may be exposed to hepatitis B. Haemophilus influenzae type b (Hib) vaccine  You may need this if you have certain conditions. You may receive vaccines as individual doses or as more than one vaccine together in one shot (combination vaccines). Talk with your health care provider about the risks and benefits of combination vaccines. What tests do I need? Blood tests  Lipid and cholesterol levels. These may be checked every 5 years, or more frequently depending on your overall health.  Hepatitis C test.  Hepatitis B test. Screening  Lung cancer screening. You may have this screening every year starting at age 79 if you have a 30-pack-year history of smoking and currently smoke or have quit within the past 15 years.  Colorectal cancer screening. All adults should have this screening starting at age 35 and continuing until age 54. Your health care provider may recommend screening at age 8 if you are at increased risk. You will have tests every 1-10 years, depending on your results and the type of screening test.  Prostate cancer screening. Recommendations will vary depending on your family history and other risks.  Diabetes screening. This is done by checking your blood sugar (glucose) after you have not eaten for a while (fasting). You may have this done every 1-3 years.  Abdominal aortic aneurysm (AAA) screening.  You may need this if you are a current or former smoker.  Sexually transmitted disease (STD) testing. Follow these instructions at home: Eating and drinking  Eat a diet that includes fresh fruits and vegetables, whole grains, lean protein, and low-fat dairy products. Limit your intake of foods with high amounts of sugar, saturated  fats, and salt.  Take vitamin and mineral supplements as recommended by your health care provider.  Do not drink alcohol if your health care provider tells you not to drink.  If you drink alcohol: ? Limit how much you have to 0-2 drinks a day. ? Be aware of how much alcohol is in your drink. In the [place] one drink equals one 12 oz bottle of beer (355 mL), one 5 oz glass of wine (148 mL), or one 1 oz glass of hard liquor (44 mL). Lifestyle  Take daily care of your teeth and gums.  Stay active. Exercise for at least 30 minutes on 5 or more days each week.  Do not use any products that contain nicotine or tobacco, such as cigarettes, e-cigarettes, and chewing tobacco. If you need help quitting, ask your health care provider.  If you are sexually active, practice safe sex. Use a condom or other form of protection to prevent STIs (sexually transmitted infections).  Talk with your health care provider about taking a low-dose aspirin or statin. What's next?  Visit your health care provider once a year for a well check visit.  Ask your health care provider how often you should have your eyes and teeth checked.  Stay up to date on all vaccines. This information is not intended to replace advice given to you by your health care provider. Make sure you discuss any questions you have with your health care provider. Document Released: 05/10/2015 Document Revised: 04/07/2018 Document Reviewed: 04/07/2018 Elsevier Patient Education  2020 [employer].

## 2018-11-18 NOTE — Progress Notes (Signed)
Established Patient Office Visit     CC/Reason for Visit: Annual preventive exam  HPI: [name] is a 68 y.o. male who is coming in today for the above mentioned reasons. Past Medical History is significant for: Hyperlipidemia that has been well controlled and mild asthma for which he rarely uses albuterol.  He has severe right knee arthritis and is slated to have a right knee replacement on August 10 by Dr. Maureen Ralphs.  He needs preoperative clearance.  He has no acute complaints today other than ongoing knee pain.  He has routine eye and dental care, he is up-to-date on immunizations and cancer screening.   Past Medical/Surgical History: Past Medical History:  Diagnosis Date  . Anxiety   . Arthritis   . Asthma   . At risk for sleep apnea    STOP-BANG = 4  SENT TO PCP 05-24-2013  . Eczema   . Edema of foot   . Finger wound, simple, open    left middle index finger - stitches and dressing occured on 09/22/2016- followed by Dr Daws at [facility]   . H/O hiatal hernia   . History of kidney stones   . HOH (hard of hearing)   . Hyperlipidemia   . Influenza A   . Influenza B    symptoms started 05-16-2013/  dx 05-17-2013 by pcp  . Left ankle injury    twisted left ankle   . Left ureteral calculus   . Pneumonia    hx of   . Rash    right leg- calf -   . Wears dentures    bottom    Past Surgical History:  Procedure Laterality Date  . ANTERIOR CERVICAL DECOMP/DISCECTOMY FUSION  01-27-2007   C6  --- C7  . COLONOSCOPY    . CYSTOSCOPY WITH URETEROSCOPY Left 05/26/2013   Procedure: CYSTOSCOPY WITH URETEROSCOPY, URETHERAL DILATATION,LEFT RETROGRADE, LASER LITHOTRIPSY, STENT PLACMENT, LEFT URETEROSCOPY;  Surgeon: Ailene Rud, MD;  Location: [facility];  Service: Urology;  Laterality: Left;  . FINGER FRACTURE SURGERY  AS CHILD  . HOLMIUM LASER APPLICATION Left 6/81/1572   Procedure: HOLMIUM LASER APPLICATION;  Surgeon: Ailene Rud, MD;   Location: [facility];  Service: Urology;  Laterality: Left;  . LIPOMA EXCISION  05/17/2012   Procedure: EXCISION LIPOMA;  Surgeon: Gayland Curry, MD,FACS;  Location: [place];  Service: General;  Laterality: Left;  incisional biopsy of left shoulder soft tissue mass  . NASAL FRACTURE SURGERY  AS CHILD  . ORIF ULNAR FRACTURE Right AS CHILD  . ROTATOR CUFF REPAIR     right shoulder [id number] by Raymond    Social History:  reports that he has never smoked. He has never used smokeless tobacco. He reports current alcohol use of about 7.0 standard drinks of alcohol per week. He reports that he does not use drugs.  Allergies: Allergies  Allergen Reactions  . Sesame Oil Itching and Swelling  . Tobramycin Itching and Swelling    Used in an eye oint-reaction    Family History:  Family History  Problem Relation Age of Onset  . Arthritis Other   . Hypertension Other   . Osteoporosis Other   . Heart disease Other   . Lung disease Other   . Prostate cancer Father   . Heart disease Sister   . Colon cancer Neg Hx   . Colon polyps Neg Hx   . Esophageal  cancer Neg Hx   . Kidney disease Neg Hx   . Rectal cancer Neg Hx   . Stomach cancer Neg Hx      Current Outpatient Medications:  .  albuterol (PROAIR HFA) 108 (90 Base) MCG/ACT inhaler, ProAir HFA 90 mcg/actuation aerosol inhaler  TAKE 2 PUFFS BY MOUTH EVERY 6 HOURS AS NEEDED FOR WHEEZE OR SHORTNESS OF BREATH, Disp: , Rfl:  .  COCONUT OIL PO, Take 1 capsule by mouth daily. , Disp: , Rfl:  .  Cyanocobalamin (VITAMIN B-12) 5000 MCG TBDP, Take 5,000 mcg by mouth daily., Disp: , Rfl:  .  fluticasone (FLONASE) 50 MCG/ACT nasal spray, Place 1 spray into both nostrils 2 (two) times daily., Disp: 16 g, Rfl: 3  Current Facility-Administered Medications:  .  0.9 %  sodium chloride infusion, 500 mL, Intravenous, Continuous, Ladene Artist, MD  Review of Systems:  Constitutional:  Denies fever, chills, diaphoresis, appetite change and fatigue.  HEENT: Denies photophobia, eye pain, redness, hearing loss, ear pain, congestion, sore throat, rhinorrhea, sneezing, mouth sores, trouble swallowing, neck pain, neck stiffness and tinnitus.   Respiratory: Denies SOB, DOE, cough, chest tightness,  and wheezing.   Cardiovascular: Denies chest pain, palpitations and leg swelling.  Gastrointestinal: Denies nausea, vomiting, abdominal pain, diarrhea, constipation, blood in stool and abdominal distention.  Genitourinary: Denies dysuria, urgency, frequency, hematuria, flank pain and difficulty urinating.  Endocrine: Denies: hot or cold intolerance, sweats, changes in hair or nails, polyuria, polydipsia. Musculoskeletal: Denies myalgias, back pain. Skin: Denies pallor, rash and wound.  Neurological: Denies dizziness, seizures, syncope, weakness, light-headedness, numbness and headaches.  Hematological: Denies adenopathy. Easy bruising, personal or family bleeding history  Psychiatric/Behavioral: Denies suicidal ideation, mood changes, confusion, nervousness, sleep disturbance and agitation    Physical Exam: Vitals:   11/18/18 0857  BP: 110/80  Pulse: 87  Temp: 98.1 F (36.7 C)  TempSrc: Oral  SpO2: 97%  Weight: 252 lb 14.4 oz (114.7 kg)  Height: '5\' 11"'  (1.803 m)    Body mass index is 35.27 kg/m.   Constitutional: NAD, calm, comfortable Eyes: PERRL, lids and conjunctivae normal ENMT: Mucous membranes are moist.  Tympanic membrane is pearly white, no erythema or bulging. Neck: normal, supple, no masses, no thyromegaly Respiratory: clear to auscultation bilaterally, no wheezing, no crackles. Normal respiratory effort. No accessory muscle use.  Cardiovascular: Regular rate and rhythm, no murmurs / rubs / gallops. No extremity edema. 2+ pedal pulses. No carotid bruits.  Abdomen: no tenderness, no masses palpated. No hepatosplenomegaly. Bowel sounds positive.  Musculoskeletal:  no clubbing / cyanosis.  Significant edema of right knee good ROM, no contractures. Normal muscle tone.  Skin: no rashes, lesions, ulcers. No induration Neurologic: CN 2-12 grossly intact. Sensation intact, DTR normal. Strength 5/5 in all 4.  Psychiatric: Normal judgment and insight. Alert and oriented x 3. Normal mood.    Impression and Plan:  Pure hypercholesterolemia  -Last LDL was 153 in November 2018.  He is on no medications, will recheck lipids today.  Encounter for preventive health examination  -Has routine eye and dental care. -Is up-to-date on immunizations. -Healthy lifestyle has been discussed in detail today. -Screening labs to be performed today. -He had a colonoscopy in 2018 and is a 10-year callback.  Preoperative clearance -EKG done in office today as part of his preoperative clearance and interpreted by myself: sinus rhythm at a rate of 70 with some sinus arrhythmia no acute ST or T wave changes. -He is a class I risk that  conveys a 3.9% 30-day risk of death, MI, cardiac arrest. -He is limited with ambulation and climbing up steps only due to his right knee pain, no shortness of breath or chest pain. -Cleared to proceed to surgery without further work-up.  Moderate asthma without complication, unspecified whether persistent -Infrequent albuterol use.  Ganglion cyst of dorsum of left wrist -May need to send to dermatology for removal after knee replacement.  It does cause some pain.    Patient Instructions  -Nice seeing you today!!  -Lab work today; will notify you once results are available.  -OK to proceed to knee surgery without further work up.   Preventive Care 32 Years and Older, Male Preventive care refers to lifestyle choices and visits with your health care provider that can promote health and wellness. This includes:  A yearly physical exam. This is also called an annual well check.  Regular dental and eye exams.  Immunizations.  Screening  for certain conditions.  Healthy lifestyle choices, such as diet and exercise. What can I expect for my preventive care visit? Physical exam Your health care provider will check:  Height and weight. These may be used to calculate body mass index (BMI), which is a measurement that tells if you are at a healthy weight.  Heart rate and blood pressure.  Your skin for abnormal spots. Counseling Your health care provider may ask you questions about:  Alcohol, tobacco, and drug use.  Emotional well-being.  Home and relationship well-being.  Sexual activity.  Eating habits.  History of falls.  Memory and ability to understand (cognition).  Work and work Statistician. What immunizations do I need?  Influenza (flu) vaccine  This is recommended every year. Tetanus, diphtheria, and pertussis (Tdap) vaccine  You may need a Td booster every 10 years. Varicella (chickenpox) vaccine  You may need this vaccine if you have not already been vaccinated. Zoster (shingles) vaccine  You may need this after age 92. Pneumococcal conjugate (PCV13) vaccine  One dose is recommended after age 85. Pneumococcal polysaccharide (PPSV23) vaccine  One dose is recommended after age 35. Measles, mumps, and rubella (MMR) vaccine  You may need at least one dose of MMR if you were born in 1957 or later. You may also need a second dose. Meningococcal conjugate (MenACWY) vaccine  You may need this if you have certain conditions. Hepatitis A vaccine  You may need this if you have certain conditions or if you travel or work in places where you may be exposed to hepatitis A. Hepatitis B vaccine  You may need this if you have certain conditions or if you travel or work in places where you may be exposed to hepatitis B. Haemophilus influenzae type b (Hib) vaccine  You may need this if you have certain conditions. You may receive vaccines as individual doses or as more than one vaccine together in  one shot (combination vaccines). Talk with your health care provider about the risks and benefits of combination vaccines. What tests do I need? Blood tests  Lipid and cholesterol levels. These may be checked every 5 years, or more frequently depending on your overall health.  Hepatitis C test.  Hepatitis B test. Screening  Lung cancer screening. You may have this screening every year starting at age 20 if you have a 30-pack-year history of smoking and currently smoke or have quit within the past 15 years.  Colorectal cancer screening. All adults should have this screening starting at age 74 and continuing until age  87. Your health care provider may recommend screening at age 56 if you are at increased risk. You will have tests every 1-10 years, depending on your results and the type of screening test.  Prostate cancer screening. Recommendations will vary depending on your family history and other risks.  Diabetes screening. This is done by checking your blood sugar (glucose) after you have not eaten for a while (fasting). You may have this done every 1-3 years.  Abdominal aortic aneurysm (AAA) screening. You may need this if you are a current or former smoker.  Sexually transmitted disease (STD) testing. Follow these instructions at home: Eating and drinking  Eat a diet that includes fresh fruits and vegetables, whole grains, lean protein, and low-fat dairy products. Limit your intake of foods with high amounts of sugar, saturated fats, and salt.  Take vitamin and mineral supplements as recommended by your health care provider.  Do not drink alcohol if your health care provider tells you not to drink.  If you drink alcohol: ? Limit how much you have to 0-2 drinks a day. ? Be aware of how much alcohol is in your drink. In the [place] one drink equals one 12 oz bottle of beer (355 mL), one 5 oz glass of wine (148 mL), or one 1 oz glass of hard liquor (44 mL). Lifestyle  Take daily  care of your teeth and gums.  Stay active. Exercise for at least 30 minutes on 5 or more days each week.  Do not use any products that contain nicotine or tobacco, such as cigarettes, e-cigarettes, and chewing tobacco. If you need help quitting, ask your health care provider.  If you are sexually active, practice safe sex. Use a condom or other form of protection to prevent STIs (sexually transmitted infections).  Talk with your health care provider about taking a low-dose aspirin or statin. What's next?  Visit your health care provider once a year for a well check visit.  Ask your health care provider how often you should have your eyes and teeth checked.  Stay up to date on all vaccines. This information is not intended to replace advice given to you by your health care provider. Make sure you discuss any questions you have with your health care provider. Document Released: 05/10/2015 Document Revised: 04/07/2018 Document Reviewed: 04/07/2018 Elsevier Patient Education  2020 [place], MD [facility]

## 2018-11-29 ENCOUNTER — Other Ambulatory Visit: Payer: PPO

## 2018-11-29 NOTE — Patient Instructions (Addendum)
YOU NEED TO HAVE A COVID 19 TEST ON 12-01-18 @10 :00 AM, THIS TEST MUST BE DONE BEFORE SURGERY, COME  [place], [place] , [id number]. ONCE YOUR COVID TEST IS COMPLETED, PLEASE BEGIN THE QUARANTINE INSTRUCTIONS AS OUTLINED IN YOUR HANDOUT.                [name]  11/29/2018   Your procedure is scheduled on: 12-05-18   Report to [facility] Main  Entrance    Report to Admitting at 8:00 AM   1 VISITOR IS ALLOWED TO WAIT IN WAITING ROOM  ONLY DAY OF YOUR SURGERY.    Call this number if you have problems the morning of surgery [phone]    Remember: After Midnight.  NOTHING BY MOUTH EXCEPT CLEAR LIQUIDS. PLEASE FINISH ENSURE DRINK AT 8:30 AM.   CLEAR LIQUID DIET   Foods Allowed                                                                     Foods Excluded  Coffee and tea, regular and decaf                             liquids that you cannot  Plain Jell-O any favor except red or purple                                           see through such as: Fruit ices (not with fruit pulp)                                     milk, soups, orange juice  Iced Popsicles                                    All solid food Carbonated beverages, regular and diet                                    Cranberry, grape and apple juices Sports drinks like Gatorade Lightly seasoned clear broth or consume(fat free) Sugar, honey syrup  Sample Menu Breakfast                                Lunch                                     Supper Cranberry juice                    Beef broth                            Chicken broth Jell-O  Grape juice                           Apple juice Coffee or tea                        Jell-O                                      Popsicle                                                Coffee or tea                        Coffee or  tea  _____________________________________________________________________       Take these medicines the morning of surgery with A SIP OF WATER: None  BRUSH YOUR TEETH MORNING OF SURGERY AND RINSE YOUR MOUTH OUT, NO CHEWING GUM CANDY OR MINTS.                                 You may not have any metal on your body including hair pins and              piercings     Do not wear jewelry, cologne, lotions, powders or deodorant                      Men may shave face and neck.   Do not bring valuables to the hospital. [place].  Contacts, dentures or bridgework may not be worn into surgery.    Special Instructions: N/A              Please read over the following fact sheets you were given: _____________________________________________________________________             [facility] - Preparing for Surgery Before surgery, you can play an important role.  Because skin is not sterile, your skin needs to be as free of germs as possible.  You can reduce the number of germs on your skin by washing with CHG (chlorahexidine gluconate) soap before surgery.  CHG is an antiseptic cleaner which kills germs and bonds with the skin to continue killing germs even after washing. Please DO NOT use if you have an allergy to CHG or antibacterial soaps.  If your skin becomes reddened/irritated stop using the CHG and inform your nurse when you arrive at Short Stay. Do not shave (including legs and underarms) for at least 48 hours prior to the first CHG shower.  You may shave your face/neck. Please follow these instructions carefully:  1.  Shower with CHG Soap the night before surgery and the  morning of Surgery.  2.  If you choose to wash your hair, wash your hair first as usual with your  normal  shampoo.  3.  After you shampoo, rinse your hair and body thoroughly to remove the  shampoo.  4.  Use CHG as you would any other  liquid soap.  You can apply chg directly  to the skin and wash                       Gently with a scrungie or clean washcloth.  5.  Apply the CHG Soap to your body ONLY FROM THE NECK DOWN.   Do not use on face/ open                           Wound or open sores. Avoid contact with eyes, ears mouth and genitals (private parts).                       Wash face,  Genitals (private parts) with your normal soap.             6.  Wash thoroughly, paying special attention to the area where your surgery  will be performed.  7.  Thoroughly rinse your body with warm water from the neck down.  8.  DO NOT shower/wash with your normal soap after using and rinsing off  the CHG Soap.                9.  Pat yourself dry with a clean towel.            10.  Wear clean pajamas.            11.  Place clean sheets on your bed the night of your first shower and do not  sleep with pets. Day of Surgery : Do not apply any lotions/deodorants the morning of surgery.  Please wear clean clothes to the hospital/surgery center.  FAILURE TO FOLLOW THESE INSTRUCTIONS MAY RESULT IN THE CANCELLATION OF YOUR SURGERY PATIENT SIGNATURE_________________________________  NURSE SIGNATURE__________________________________  ________________________________________________________________________   Adam Phenix  An incentive spirometer is a tool that can help keep your lungs clear and active. This tool measures how well you are filling your lungs with each breath. Taking long deep breaths may help reverse or decrease the chance of developing breathing (pulmonary) problems (especially infection) following:  A long period of time when you are unable to move or be active. BEFORE THE PROCEDURE   If the spirometer includes an indicator to show your best effort, your nurse or respiratory therapist will set it to a desired goal.  If possible, sit up straight or lean slightly forward. Try not to slouch.  Hold the incentive  spirometer in an upright position. INSTRUCTIONS FOR USE  1. Sit on the edge of your bed if possible, or sit up as far as you can in bed or on a chair. 2. Hold the incentive spirometer in an upright position. 3. Breathe out normally. 4. Place the mouthpiece in your mouth and seal your lips tightly around it. 5. Breathe in slowly and as deeply as possible, raising the piston or the ball toward the top of the column. 6. Hold your breath for 3-5 seconds or for as long as possible. Allow the piston or ball to fall to the bottom of the column. 7. Remove the mouthpiece from your mouth and breathe out normally. 8. Rest for a few seconds and repeat Steps 1 through 7 at least 10 times every 1-2 hours when you are awake. Take your time and take a few normal breaths between deep breaths. 9. The spirometer may include an indicator to  show your best effort. Use the indicator as a goal to work toward during each repetition. 10. After each set of 10 deep breaths, practice coughing to be sure your lungs are clear. If you have an incision (the cut made at the time of surgery), support your incision when coughing by placing a pillow or rolled up towels firmly against it. Once you are able to get out of bed, walk around indoors and cough well. You may stop using the incentive spirometer when instructed by your caregiver.  RISKS AND COMPLICATIONS  Take your time so you do not get dizzy or light-headed.  If you are in pain, you may need to take or ask for pain medication before doing incentive spirometry. It is harder to take a deep breath if you are having pain. AFTER USE  Rest and breathe slowly and easily.  It can be helpful to keep track of a log of your progress. Your caregiver can provide you with a simple table to help with this. If you are using the spirometer at home, follow these instructions: [place] IF:   You are having difficultly using the spirometer.  You have trouble using the  spirometer as often as instructed.  Your pain medication is not giving enough relief while using the spirometer.  You develop fever of 100.5 F (38.1 C) or higher. SEEK IMMEDIATE MEDICAL CARE IF:   You cough up bloody sputum that had not been present before.  You develop fever of 102 F (38.9 C) or greater.  You develop worsening pain at or near the incision site. MAKE SURE YOU:   Understand these instructions.  Will watch your condition.  Will get help right away if you are not doing well or get worse. Document Released: 08/24/2006 Document Revised: 07/06/2011 Document Reviewed: 10/25/2006 ExitCare Patient Information 2014 ExitCare, [place].   ________________________________________________________________________  WHAT IS A BLOOD TRANSFUSION? Blood Transfusion Information  A transfusion is the replacement of blood or some of its parts. Blood is made up of multiple cells which provide different functions.  Red blood cells carry oxygen and are used for blood loss replacement.  White blood cells fight against infection.  Platelets control bleeding.  Plasma helps clot blood.  Other blood products are available for specialized needs, such as hemophilia or other clotting disorders. BEFORE THE TRANSFUSION  Who gives blood for transfusions?   Healthy volunteers who are fully evaluated to make sure their blood is safe. This is blood bank blood. Transfusion therapy is the safest it has ever been in the practice of medicine. Before blood is taken from a donor, a complete history is taken to make sure that person has no history of diseases nor engages in risky social behavior (examples are intravenous drug use or sexual activity with multiple partners). The donor's travel history is screened to minimize risk of transmitting infections, such as malaria. The donated blood is tested for signs of infectious diseases, such as HIV and hepatitis. The blood is then tested to be sure it is  compatible with you in order to minimize the chance of a transfusion reaction. If you or a relative donates blood, this is often done in anticipation of surgery and is not appropriate for emergency situations. It takes many days to process the donated blood. RISKS AND COMPLICATIONS Although transfusion therapy is very safe and saves many lives, the main dangers of transfusion include:   Getting an infectious disease.  Developing a transfusion reaction. This is an allergic reaction  to something in the blood you were given. Every precaution is taken to prevent this. The decision to have a blood transfusion has been considered carefully by your caregiver before blood is given. Blood is not given unless the benefits outweigh the risks. AFTER THE TRANSFUSION  Right after receiving a blood transfusion, you will usually feel much better and more energetic. This is especially true if your red blood cells have gotten low (anemic). The transfusion raises the level of the red blood cells which carry oxygen, and this usually causes an energy increase.  The nurse administering the transfusion will monitor you carefully for complications. HOME CARE INSTRUCTIONS  No special instructions are needed after a transfusion. You may find your energy is better. Speak with your caregiver about any limitations on activity for underlying diseases you may have. SEEK MEDICAL CARE IF:   Your condition is not improving after your transfusion.  You develop redness or irritation at the intravenous (IV) site. SEEK IMMEDIATE MEDICAL CARE IF:  Any of the following symptoms occur over the next 12 hours:  Shaking chills.  You have a temperature by mouth above 102 F (38.9 C), not controlled by medicine.  Chest, back, or muscle pain.  People around you feel you are not acting correctly or are confused.  Shortness of breath or difficulty breathing.  Dizziness and fainting.  You get a rash or develop hives.  You have  a decrease in urine output.  Your urine turns a dark color or changes to pink, red, or brown. Any of the following symptoms occur over the next 10 days:  You have a temperature by mouth above 102 F (38.9 C), not controlled by medicine.  Shortness of breath.  Weakness after normal activity.  The white part of the eye turns yellow (jaundice).  You have a decrease in the amount of urine or are urinating less often.  Your urine turns a dark color or changes to pink, red, or brown. Document Released: 04/10/2000 Document Revised: 07/06/2011 Document Reviewed: 11/28/2007 [facility] Patient Information 2014 [place], [place].  _______________________________________________________________________

## 2018-11-29 NOTE — Progress Notes (Addendum)
   11-18-18 ( Epic) EKG and Surgical Clearance from Dr. Jerilee Hoh

## 2018-11-30 ENCOUNTER — Encounter (INDEPENDENT_AMBULATORY_CARE_PROVIDER_SITE_OTHER): Payer: Self-pay

## 2018-11-30 ENCOUNTER — Other Ambulatory Visit: Payer: Self-pay

## 2018-11-30 ENCOUNTER — Encounter (HOSPITAL_COMMUNITY): Payer: Self-pay

## 2018-11-30 ENCOUNTER — Encounter (HOSPITAL_COMMUNITY)
Admission: RE | Admit: 2018-11-30 | Discharge: 2018-11-30 | Disposition: A | Discharge status: Home / Self Care | Payer: PPO | Source: Ambulatory Visit | Attending: Orthopedic Surgery | Admitting: Orthopedic Surgery

## 2018-11-30 DIAGNOSIS — M1711 Unilateral primary osteoarthritis, right knee: Secondary | ICD-10-CM | POA: Diagnosis not present

## 2018-11-30 DIAGNOSIS — Z01812 Encounter for preprocedural laboratory examination: Secondary | ICD-10-CM | POA: Insufficient documentation

## 2018-11-30 DIAGNOSIS — Z20828 Contact with and (suspected) exposure to other viral communicable diseases: Secondary | ICD-10-CM | POA: Insufficient documentation

## 2018-11-30 LAB — CBC
HCT: 47.7 % (ref 39.0–52.0)
Hemoglobin: 15.9 g/dL (ref 13.0–17.0)
MCH: 32.6 pg (ref 26.0–34.0)
MCHC: 33.3 g/dL (ref 30.0–36.0)
MCV: 97.7 fL (ref 80.0–100.0)
Platelets: 223 10*3/uL (ref 150–400)
RBC: 4.88 MIL/uL (ref 4.22–5.81)
RDW: 12.1 % (ref 11.5–15.5)
WBC: 6.7 10*3/uL (ref 4.0–10.5)
nRBC: 0 % (ref 0.0–0.2)

## 2018-11-30 LAB — COMPREHENSIVE METABOLIC PANEL
ALT: 29 U/L (ref 0–44)
AST: 24 U/L (ref 15–41)
Albumin: 4.2 g/dL (ref 3.5–5.0)
Alkaline Phosphatase: 53 U/L (ref 38–126)
Anion gap: 8 (ref 5–15)
BUN: 17 mg/dL (ref 8–23)
CO2: 27 mmol/L (ref 22–32)
Calcium: 9.1 mg/dL (ref 8.9–10.3)
Chloride: 104 mmol/L (ref 98–111)
Creatinine, Ser: 1.11 mg/dL (ref 0.61–1.24)
GFR calc Af Amer: >60 mL/min (ref >60)
GFR calc non Af Amer: >60 mL/min (ref >60)
Glucose, Bld: 129 mg/dL — ABNORMAL HIGH (ref 70–99)
Potassium: 4.5 mmol/L (ref 3.5–5.1)
Sodium: 139 mmol/L (ref 135–145)
Total Bilirubin: 1.2 mg/dL (ref 0.3–1.2)
Total Protein: 7.3 g/dL (ref 6.5–8.1)

## 2018-11-30 LAB — SURGICAL PCR SCREEN
MRSA, PCR: NEGATIVE
Staphylococcus aureus: POSITIVE — AB

## 2018-11-30 LAB — PROTIME-INR
INR: 1.1 (ref 0.8–1.2)
Prothrombin Time: 14 seconds (ref 11.4–15.2)

## 2018-11-30 LAB — APTT: aPTT: 31 seconds (ref 24–36)

## 2018-11-30 NOTE — Progress Notes (Signed)
   11/30/18 0832  OBSTRUCTIVE SLEEP APNEA  Have you ever been diagnosed with sleep apnea through a sleep study? No  Do you snore loudly (loud enough to be heard through closed doors)?  0  Do you often feel tired, fatigued, or sleepy during the daytime (such as falling asleep during driving or talking to someone)? 0  Has anyone observed you stop breathing during your sleep? 0  Do you have, or are you being treated for high blood pressure? 1  BMI more than 35 kg/m2? 1  Age > 50 (1-yes) 1  Neck circumference greater than:Male 16 inches or larger, Male 17inches or larger? 1  Male Gender (Yes=1) 1  Obstructive Sleep Apnea Score 5  Score 5 or greater  Results sent to PCP

## 2018-11-30 NOTE — Progress Notes (Signed)
11-30-18 PCR result routed to Dr. Anne Fu office for review

## 2018-12-01 ENCOUNTER — Other Ambulatory Visit (HOSPITAL_COMMUNITY)
Admission: RE | Admit: 2018-12-01 | Discharge: 2018-12-01 | Disposition: A | Discharge status: Home / Self Care | Payer: PPO | Source: Ambulatory Visit | Attending: Orthopedic Surgery | Admitting: Orthopedic Surgery

## 2018-12-01 DIAGNOSIS — Z01812 Encounter for preprocedural laboratory examination: Secondary | ICD-10-CM | POA: Diagnosis not present

## 2018-12-01 LAB — SARS CORONAVIRUS 2 (TAT 6-24 HRS): SARS Coronavirus 2: NEGATIVE

## 2018-12-01 NOTE — Anesthesia Preprocedure Evaluation (Addendum)
Anesthesia Evaluation  Patient identified by MRN, date of birth, ID band Patient awake    Reviewed: Allergy & Precautions, H&P , NPO status , Patient's Chart, lab work & pertinent test results, reviewed documented beta blocker date and time   Airway Mallampati: II  TM Distance: >3 FB Neck ROM: full    Dental no notable dental hx.    Pulmonary asthma ,    Pulmonary exam normal breath sounds clear to auscultation       Cardiovascular Exercise Tolerance: Good negative cardio ROS   Rhythm:regular Rate:Normal  EKG: 11/18/2018 Rate 71 bpm Sinus rhythm with rate variation Within normal limits     Neuro/Psych PSYCHIATRIC DISORDERS Anxiety negative neurological ROS     GI/Hepatic Neg liver ROS, hiatal hernia,   Endo/Other  negative endocrine ROS  Renal/GU Renal disease  negative genitourinary   Musculoskeletal  (+) Arthritis , Osteoarthritis,    Abdominal   Peds  Hematology negative hematology ROS (+)   Anesthesia Other Findings   Reproductive/Obstetrics negative OB ROS                             Anesthesia Physical Anesthesia Plan  ASA: III  Anesthesia Plan: Spinal   Post-op Pain Management:  [facility] for Post-op pain   Induction:   PONV Risk Score and Plan: 1  Airway Management Planned: Nasal Cannula  Additional Equipment:   Intra-op Plan:   Post-operative Plan:   Informed Consent: I have reviewed the patients History and Physical, chart, labs and discussed the procedure including the risks, benefits and alternatives for the proposed anesthesia with the patient or authorized representative who has indicated his/her understanding and acceptance.     Dental Advisory Given  Plan Discussed with: CRNA, Anesthesiologist and Surgeon  Anesthesia Plan Comments: (See PAT note 11/30/2018, Konrad Felix, PA-C)       Anesthesia Quick Evaluation

## 2018-12-01 NOTE — Progress Notes (Signed)
Anesthesia Chart Review   Case: [id number] Date/Time: 12/05/18 1115   Procedure: TOTAL KNEE ARTHROPLASTY (Right ) - 4min   Anesthesia type: Choice   Pre-op diagnosis: right knee osteoarthritis   Location: [facility] ROOM 09 / [facility]   Surgeon: Gaynelle Arabian, MD      DISCUSSION:68 y.o. never smoker with h/o HLD, asthma, anxiety, hiatal hernia, right knee OA scheduled for above procedure 11/30/2018 with Dr. Gaynelle Arabian.   Pt last seen by PCP, Dr. Isaac Bliss, 11/18/2018.  Per OV note, "EKG done in office today as part of his preoperative clearance and interpreted by myself: sinus rhythm at a rate of 70 with some sinus arrhythmia no acute ST or T wave changes.  He is a class I risk that conveys a 3.9% 30-day risk of death, MI, cardiac arrest.  He is limited with ambulation and climbing up steps only due to his right knee pain, no shortness of breath or chest pain.  Cleared to proceed to surgery without further work-up."  Anticipate pt can proceed with planned procedure barring acute status change.   VS: BP (!) 143/95   Pulse 73   Temp 36.9 C (Oral)   Resp 18   Ht 5\' 11"  (1.803 m)   Wt 117.1 kg   SpO2 97%   BMI 35.99 kg/m   PROVIDERS: Isaac Bliss, Rayford Halsted, MD is PCP    LABS: Labs reviewed: Acceptable for surgery. (all labs ordered are listed, but only abnormal results are displayed)  Labs Reviewed  SURGICAL PCR SCREEN - Abnormal; Notable for the following components:      Result Value   Staphylococcus aureus POSITIVE (*)    All other components within normal limits  COMPREHENSIVE METABOLIC PANEL - Abnormal; Notable for the following components:   Glucose, Bld 129 (*)    All other components within normal limits  APTT  CBC  PROTIME-INR  TYPE AND SCREEN     IMAGES:   EKG: 11/18/2018 Rate 71 bpm Sinus rhythm with rate variation Within normal limits   CV:  Past Medical History:  Diagnosis Date  . Anxiety   . Arthritis   . Asthma   . At risk for sleep apnea     STOP-BANG = 4  SENT TO PCP 05-24-2013  . Eczema   . Edema of foot   . Finger wound, simple, open    left middle index finger - stitches and dressing occured on 09/22/2016- followed by Dr Daws at [facility]   . H/O hiatal hernia   . History of kidney stones   . HOH (hard of hearing)   . Hyperlipidemia   . Influenza A   . Influenza B    symptoms started 05-16-2013/  dx 05-17-2013 by pcp  . Left ankle injury    twisted left ankle   . Left ureteral calculus   . Pneumonia    hx of   . Rash    right leg- calf -   . Wears dentures    bottom    Past Surgical History:  Procedure Laterality Date  . ANTERIOR CERVICAL DECOMP/DISCECTOMY FUSION  01-27-2007   C6  --- C7  . COLONOSCOPY    . CYSTOSCOPY WITH URETEROSCOPY Left 05/26/2013   Procedure: CYSTOSCOPY WITH URETEROSCOPY, URETHERAL DILATATION,LEFT RETROGRADE, LASER LITHOTRIPSY, STENT PLACMENT, LEFT URETEROSCOPY;  Surgeon: Ailene Rud, MD;  Location: [facility];  Service: Urology;  Laterality: Left;  . FINGER FRACTURE SURGERY  AS CHILD  . HOLMIUM LASER APPLICATION  Left 05/26/2013   Procedure: HOLMIUM LASER APPLICATION;  Surgeon: Ailene Rud, MD;  Location: [facility];  Service: Urology;  Laterality: Left;  . LIPOMA EXCISION  05/17/2012   Procedure: EXCISION LIPOMA;  Surgeon: Gayland Curry, MD,FACS;  Location: [place];  Service: General;  Laterality: Left;  incisional biopsy of left shoulder soft tissue mass  . NASAL FRACTURE SURGERY  AS CHILD  . ORIF ULNAR FRACTURE Right AS CHILD  . ROTATOR CUFF REPAIR     right shoulder [id number] by Scalp Level: . albuterol (PROAIR HFA) 108 (90 Base) MCG/ACT inhaler  . Ascorbic Acid (VITAMIN C) 1000 MG tablet  . Cyanocobalamin (VITAMIN B-12) 5000 MCG TBDP  . diclofenac sodium (VOLTAREN) 1 % GEL  . OVER THE COUNTER MEDICATION   . 0.9 %  sodium chloride infusion    Maia Plan [facility] Pre-Surgical Testing [phone] 12/01/18  3:17 PM

## 2018-12-04 MED ORDER — BUPIVACAINE LIPOSOME 1.3 % IJ SUSP
20.0000 mL | INTRAMUSCULAR | Status: DC
  Filled 2018-12-04: qty 20

## 2018-12-05 ENCOUNTER — Other Ambulatory Visit: Payer: Self-pay

## 2018-12-05 ENCOUNTER — Encounter (HOSPITAL_COMMUNITY)
Admission: RE | Disposition: A | Discharge status: Home / Self Care | Payer: Self-pay | Source: Home / Self Care | Attending: Orthopedic Surgery

## 2018-12-05 ENCOUNTER — Inpatient Hospital Stay (HOSPITAL_COMMUNITY)
Admission: RE | Admit: 2018-12-05 | Discharge: 2018-12-06 | DRG: 470 | Disposition: A | Discharge status: Home / Self Care | Payer: PPO | Attending: Orthopedic Surgery | Admitting: Orthopedic Surgery

## 2018-12-05 ENCOUNTER — Inpatient Hospital Stay (HOSPITAL_COMMUNITY): Payer: PPO | Admitting: Physician Assistant

## 2018-12-05 ENCOUNTER — Encounter (HOSPITAL_COMMUNITY): Payer: Self-pay | Admitting: Emergency Medicine

## 2018-12-05 ENCOUNTER — Inpatient Hospital Stay (HOSPITAL_COMMUNITY): Payer: PPO | Admitting: Anesthesiology

## 2018-12-05 DIAGNOSIS — Z6835 Body mass index (BMI) 35.0-35.9, adult: Secondary | ICD-10-CM

## 2018-12-05 DIAGNOSIS — Z87442 Personal history of urinary calculi: Secondary | ICD-10-CM

## 2018-12-05 DIAGNOSIS — Z91018 Allergy to other foods: Secondary | ICD-10-CM

## 2018-12-05 DIAGNOSIS — H9190 Unspecified hearing loss, unspecified ear: Secondary | ICD-10-CM | POA: Diagnosis present

## 2018-12-05 DIAGNOSIS — K449 Diaphragmatic hernia without obstruction or gangrene: Secondary | ICD-10-CM | POA: Diagnosis not present

## 2018-12-05 DIAGNOSIS — J309 Allergic rhinitis, unspecified: Secondary | ICD-10-CM | POA: Diagnosis present

## 2018-12-05 DIAGNOSIS — Z981 Arthrodesis status: Secondary | ICD-10-CM

## 2018-12-05 DIAGNOSIS — E785 Hyperlipidemia, unspecified: Secondary | ICD-10-CM | POA: Diagnosis present

## 2018-12-05 DIAGNOSIS — M25761 Osteophyte, right knee: Secondary | ICD-10-CM | POA: Diagnosis present

## 2018-12-05 DIAGNOSIS — Z7951 Long term (current) use of inhaled steroids: Secondary | ICD-10-CM | POA: Diagnosis not present

## 2018-12-05 DIAGNOSIS — M1711 Unilateral primary osteoarthritis, right knee: Secondary | ICD-10-CM | POA: Diagnosis not present

## 2018-12-05 DIAGNOSIS — E669 Obesity, unspecified: Secondary | ICD-10-CM | POA: Diagnosis not present

## 2018-12-05 DIAGNOSIS — J45909 Unspecified asthma, uncomplicated: Secondary | ICD-10-CM | POA: Diagnosis not present

## 2018-12-05 DIAGNOSIS — F419 Anxiety disorder, unspecified: Secondary | ICD-10-CM | POA: Diagnosis not present

## 2018-12-05 DIAGNOSIS — M1710 Unilateral primary osteoarthritis, unspecified knee: Secondary | ICD-10-CM

## 2018-12-05 DIAGNOSIS — J4540 Moderate persistent asthma, uncomplicated: Secondary | ICD-10-CM | POA: Diagnosis present

## 2018-12-05 DIAGNOSIS — Z8249 Family history of ischemic heart disease and other diseases of the circulatory system: Secondary | ICD-10-CM

## 2018-12-05 DIAGNOSIS — Z20828 Contact with and (suspected) exposure to other viral communicable diseases: Secondary | ICD-10-CM | POA: Diagnosis present

## 2018-12-05 DIAGNOSIS — G8918 Other acute postprocedural pain: Secondary | ICD-10-CM | POA: Diagnosis not present

## 2018-12-05 DIAGNOSIS — M179 Osteoarthritis of knee, unspecified: Secondary | ICD-10-CM

## 2018-12-05 DIAGNOSIS — Z79899 Other long term (current) drug therapy: Secondary | ICD-10-CM | POA: Diagnosis not present

## 2018-12-05 DIAGNOSIS — Z881 Allergy status to other antibiotic agents status: Secondary | ICD-10-CM | POA: Diagnosis not present

## 2018-12-05 DIAGNOSIS — M1990 Unspecified osteoarthritis, unspecified site: Secondary | ICD-10-CM | POA: Diagnosis present

## 2018-12-05 HISTORY — PX: TOTAL KNEE ARTHROPLASTY: SHX125

## 2018-12-05 LAB — TYPE AND SCREEN
ABO/RH(D): A NEG
Antibody Screen: NEGATIVE

## 2018-12-05 SURGERY — ARTHROPLASTY, KNEE, TOTAL
Anesthesia: Spinal | Laterality: Right

## 2018-12-05 MED ORDER — BUPIVACAINE LIPOSOME 1.3 % IJ SUSP
INTRAMUSCULAR | Status: DC | PRN
  Administered 2018-12-05: 20 mL

## 2018-12-05 MED ORDER — METHOCARBAMOL 500 MG PO TABS
500.0000 mg | ORAL_TABLET | Freq: Four times a day (QID) | ORAL | Status: DC | PRN
  Administered 2018-12-05: 500 mg via ORAL
  Filled 2018-12-05: qty 1

## 2018-12-05 MED ORDER — MENTHOL 3 MG MT LOZG: 1.0000 | LOZENGE | OROMUCOSAL | Status: DC | PRN

## 2018-12-05 MED ORDER — DEXAMETHASONE SODIUM PHOSPHATE 10 MG/ML IJ SOLN
10.0000 mg | Freq: Once | INTRAMUSCULAR | Status: AC
  Administered 2018-12-06: 10 mg via INTRAVENOUS
  Filled 2018-12-05: qty 1

## 2018-12-05 MED ORDER — PROPOFOL 500 MG/50ML IV EMUL
INTRAVENOUS | Status: DC | PRN
  Administered 2018-12-05: 50 ug/kg/min via INTRAVENOUS

## 2018-12-05 MED ORDER — OXYCODONE HCL 5 MG PO TABS: 5.0000 mg | ORAL_TABLET | Freq: Once | ORAL | Status: DC | PRN

## 2018-12-05 MED ORDER — BUPIVACAINE IN DEXTROSE 0.75-8.25 % IT SOLN
INTRATHECAL | Status: DC | PRN
  Administered 2018-12-05: 1.7 mL via INTRATHECAL

## 2018-12-05 MED ORDER — METOCLOPRAMIDE HCL 5 MG/ML IJ SOLN: 5.0000 mg | Freq: Three times a day (TID) | INTRAMUSCULAR | Status: DC | PRN

## 2018-12-05 MED ORDER — POLYETHYLENE GLYCOL 3350 17 G PO PACK: 17.0000 g | PACK | Freq: Every day | ORAL | Status: DC | PRN

## 2018-12-05 MED ORDER — FENTANYL CITRATE (PF) 100 MCG/2ML IJ SOLN
50.0000 ug | INTRAMUSCULAR | Status: DC
  Administered 2018-12-05: 100 ug via INTRAVENOUS
  Filled 2018-12-05: qty 2

## 2018-12-05 MED ORDER — CEFAZOLIN SODIUM-DEXTROSE 2-4 GM/100ML-% IV SOLN
2.0000 g | INTRAVENOUS | Status: AC
  Administered 2018-12-05: 2 g via INTRAVENOUS
  Filled 2018-12-05: qty 100

## 2018-12-05 MED ORDER — ONDANSETRON HCL 4 MG/2ML IJ SOLN: 4.0000 mg | Freq: Once | INTRAMUSCULAR | Status: DC | PRN

## 2018-12-05 MED ORDER — SODIUM CHLORIDE 0.9 % IV SOLN
INTRAVENOUS | Status: DC
  Administered 2018-12-05 – 2018-12-06 (×2): via INTRAVENOUS

## 2018-12-05 MED ORDER — ASPIRIN EC 325 MG PO TBEC
325.0000 mg | DELAYED_RELEASE_TABLET | Freq: Two times a day (BID) | ORAL | Status: DC
  Administered 2018-12-06: 325 mg via ORAL
  Filled 2018-12-05: qty 1

## 2018-12-05 MED ORDER — TRANEXAMIC ACID-NACL 1000-0.7 MG/100ML-% IV SOLN
1000.0000 mg | Freq: Once | INTRAVENOUS | Status: AC
  Administered 2018-12-05: 1000 mg via INTRAVENOUS
  Filled 2018-12-05: qty 100

## 2018-12-05 MED ORDER — BISACODYL 10 MG RE SUPP: 10.0000 mg | Freq: Every day | RECTAL | Status: DC | PRN

## 2018-12-05 MED ORDER — GABAPENTIN 300 MG PO CAPS
300.0000 mg | ORAL_CAPSULE | Freq: Three times a day (TID) | ORAL | Status: DC
  Administered 2018-12-05 – 2018-12-06 (×3): 300 mg via ORAL
  Filled 2018-12-05 (×3): qty 1

## 2018-12-05 MED ORDER — MORPHINE SULFATE (PF) 2 MG/ML IV SOLN: 0.5000 mg | INTRAVENOUS | Status: DC | PRN

## 2018-12-05 MED ORDER — ROPIVACAINE HCL 7.5 MG/ML IJ SOLN
INTRAMUSCULAR | Status: DC | PRN
  Administered 2018-12-05: 25 mL via PERINEURAL

## 2018-12-05 MED ORDER — ACETAMINOPHEN 160 MG/5ML PO SOLN: 325.0000 mg | ORAL | Status: DC | PRN

## 2018-12-05 MED ORDER — PROPOFOL 10 MG/ML IV BOLUS
INTRAVENOUS | Status: AC
  Filled 2018-12-05: qty 20

## 2018-12-05 MED ORDER — POVIDONE-IODINE 10 % EX SWAB
2.0000 "application " | Freq: Once | CUTANEOUS | Status: AC
  Administered 2018-12-05: 2 via TOPICAL

## 2018-12-05 MED ORDER — ALBUTEROL SULFATE (2.5 MG/3ML) 0.083% IN NEBU: 2.5000 mg | INHALATION_SOLUTION | Freq: Four times a day (QID) | RESPIRATORY_TRACT | Status: DC | PRN

## 2018-12-05 MED ORDER — LACTATED RINGERS IV SOLN
INTRAVENOUS | Status: DC
  Administered 2018-12-05 (×2): via INTRAVENOUS

## 2018-12-05 MED ORDER — FLEET ENEMA 7-19 GM/118ML RE ENEM: 1.0000 | ENEMA | Freq: Once | RECTAL | Status: DC | PRN

## 2018-12-05 MED ORDER — CHLORHEXIDINE GLUCONATE 4 % EX LIQD: 60.0000 mL | Freq: Once | CUTANEOUS | Status: DC

## 2018-12-05 MED ORDER — TRANEXAMIC ACID-NACL 1000-0.7 MG/100ML-% IV SOLN
1000.0000 mg | INTRAVENOUS | Status: AC
  Administered 2018-12-05: 12:00:00 1000 mg via INTRAVENOUS
  Filled 2018-12-05: qty 100

## 2018-12-05 MED ORDER — OXYCODONE HCL 5 MG PO TABS
5.0000 mg | ORAL_TABLET | ORAL | Status: DC | PRN
  Administered 2018-12-05: 10 mg via ORAL
  Filled 2018-12-05: qty 2

## 2018-12-05 MED ORDER — EPHEDRINE SULFATE-NACL 50-0.9 MG/10ML-% IV SOSY
PREFILLED_SYRINGE | INTRAVENOUS | Status: DC | PRN
  Administered 2018-12-05 (×2): 5 mg via INTRAVENOUS

## 2018-12-05 MED ORDER — ONDANSETRON HCL 4 MG/2ML IJ SOLN: 4.0000 mg | Freq: Four times a day (QID) | INTRAMUSCULAR | Status: DC | PRN

## 2018-12-05 MED ORDER — ACETAMINOPHEN 325 MG PO TABS: 325.0000 mg | ORAL_TABLET | ORAL | Status: DC | PRN

## 2018-12-05 MED ORDER — ONDANSETRON HCL 4 MG PO TABS: 4.0000 mg | ORAL_TABLET | Freq: Four times a day (QID) | ORAL | Status: DC | PRN

## 2018-12-05 MED ORDER — ACETAMINOPHEN 500 MG PO TABS
1000.0000 mg | ORAL_TABLET | Freq: Four times a day (QID) | ORAL | Status: DC
  Administered 2018-12-05 – 2018-12-06 (×2): 1000 mg via ORAL
  Filled 2018-12-05 (×2): qty 2

## 2018-12-05 MED ORDER — CEFAZOLIN SODIUM-DEXTROSE 2-4 GM/100ML-% IV SOLN
2.0000 g | Freq: Four times a day (QID) | INTRAVENOUS | Status: AC
  Administered 2018-12-05 (×2): 2 g via INTRAVENOUS
  Filled 2018-12-05 (×2): qty 100

## 2018-12-05 MED ORDER — METHOCARBAMOL 500 MG IVPB - SIMPLE MED
500.0000 mg | Freq: Four times a day (QID) | INTRAVENOUS | Status: DC | PRN
  Filled 2018-12-05: qty 50

## 2018-12-05 MED ORDER — MIDAZOLAM HCL 2 MG/2ML IJ SOLN
1.0000 mg | INTRAMUSCULAR | Status: DC
  Administered 2018-12-05: 2 mg via INTRAVENOUS
  Filled 2018-12-05: qty 2

## 2018-12-05 MED ORDER — ALBUTEROL SULFATE HFA 108 (90 BASE) MCG/ACT IN AERS: 2.0000 | INHALATION_SPRAY | Freq: Four times a day (QID) | RESPIRATORY_TRACT | Status: DC | PRN

## 2018-12-05 MED ORDER — TRAMADOL HCL 50 MG PO TABS: 50.0000 mg | ORAL_TABLET | Freq: Four times a day (QID) | ORAL | Status: DC | PRN

## 2018-12-05 MED ORDER — PHENYLEPHRINE 40 MCG/ML (10ML) SYRINGE FOR IV PUSH (FOR BLOOD PRESSURE SUPPORT)
PREFILLED_SYRINGE | INTRAVENOUS | Status: DC | PRN
  Administered 2018-12-05 (×2): 40 ug via INTRAVENOUS

## 2018-12-05 MED ORDER — MEPERIDINE HCL 50 MG/ML IJ SOLN: 6.2500 mg | INTRAMUSCULAR | Status: DC | PRN

## 2018-12-05 MED ORDER — DIPHENHYDRAMINE HCL 12.5 MG/5ML PO ELIX: 12.5000 mg | ORAL_SOLUTION | ORAL | Status: DC | PRN

## 2018-12-05 MED ORDER — ACETAMINOPHEN 10 MG/ML IV SOLN
1000.0000 mg | Freq: Four times a day (QID) | INTRAVENOUS | Status: DC
  Administered 2018-12-05: 1000 mg via INTRAVENOUS
  Filled 2018-12-05: qty 100

## 2018-12-05 MED ORDER — PHENOL 1.4 % MT LIQD: 1.0000 | OROMUCOSAL | Status: DC | PRN

## 2018-12-05 MED ORDER — PROPOFOL 10 MG/ML IV BOLUS
INTRAVENOUS | Status: DC | PRN
  Administered 2018-12-05 (×3): 20 mg via INTRAVENOUS
  Administered 2018-12-05: 30 mg via INTRAVENOUS

## 2018-12-05 MED ORDER — ONDANSETRON HCL 4 MG/2ML IJ SOLN
INTRAMUSCULAR | Status: AC
  Filled 2018-12-05: qty 2

## 2018-12-05 MED ORDER — SODIUM CHLORIDE (PF) 0.9 % IJ SOLN
INTRAMUSCULAR | Status: DC | PRN
  Administered 2018-12-05: 60 mL

## 2018-12-05 MED ORDER — DOCUSATE SODIUM 100 MG PO CAPS
100.0000 mg | ORAL_CAPSULE | Freq: Two times a day (BID) | ORAL | Status: DC
  Administered 2018-12-05 – 2018-12-06 (×2): 100 mg via ORAL
  Filled 2018-12-05 (×2): qty 1

## 2018-12-05 MED ORDER — EPHEDRINE 5 MG/ML INJ
INTRAVENOUS | Status: AC
  Filled 2018-12-05: qty 10

## 2018-12-05 MED ORDER — DEXAMETHASONE SODIUM PHOSPHATE 10 MG/ML IJ SOLN
8.0000 mg | Freq: Once | INTRAMUSCULAR | Status: AC
  Administered 2018-12-05: 10 mg via INTRAVENOUS

## 2018-12-05 MED ORDER — FENTANYL CITRATE (PF) 100 MCG/2ML IJ SOLN: 25.0000 ug | INTRAMUSCULAR | Status: DC | PRN

## 2018-12-05 MED ORDER — CLONIDINE HCL (ANALGESIA) 100 MCG/ML EP SOLN
EPIDURAL | Status: DC | PRN
  Administered 2018-12-05: 100 ug

## 2018-12-05 MED ORDER — SODIUM CHLORIDE (PF) 0.9 % IJ SOLN
INTRAMUSCULAR | Status: AC
  Filled 2018-12-05: qty 10

## 2018-12-05 MED ORDER — SODIUM CHLORIDE 0.9 % IR SOLN
Status: DC | PRN
  Administered 2018-12-05 (×2): 1000 mL

## 2018-12-05 MED ORDER — DEXAMETHASONE SODIUM PHOSPHATE 10 MG/ML IJ SOLN
INTRAMUSCULAR | Status: AC
  Filled 2018-12-05: qty 1

## 2018-12-05 MED ORDER — PHENYLEPHRINE 40 MCG/ML (10ML) SYRINGE FOR IV PUSH (FOR BLOOD PRESSURE SUPPORT)
PREFILLED_SYRINGE | INTRAVENOUS | Status: AC
  Filled 2018-12-05: qty 10

## 2018-12-05 MED ORDER — METOCLOPRAMIDE HCL 5 MG PO TABS: 5.0000 mg | ORAL_TABLET | Freq: Three times a day (TID) | ORAL | Status: DC | PRN

## 2018-12-05 MED ORDER — OXYCODONE HCL 5 MG/5ML PO SOLN: 5.0000 mg | Freq: Once | ORAL | Status: DC | PRN

## 2018-12-05 MED ORDER — SODIUM CHLORIDE (PF) 0.9 % IJ SOLN
INTRAMUSCULAR | Status: AC
  Filled 2018-12-05: qty 50

## 2018-12-05 SURGICAL SUPPLY — 57 items
ATTUNE MED DOME PAT 41 KNEE (Knees) ×2 IMPLANT
ATTUNE PS FEM RT SZ 7 CEM KNEE (Femur) ×2 IMPLANT
ATTUNE PSRP INSR SZ7 12 KNEE (Insert) ×2 IMPLANT
BAG ZIPLOCK 12X15 (MISCELLANEOUS) ×2 IMPLANT
BASE TIBIAL ROT PLAT SZ 7 KNEE (Knees) ×1 IMPLANT
BLADE SAG 18X100X1.27 (BLADE) ×2 IMPLANT
BLADE SAW SGTL 11.0X1.19X90.0M (BLADE) ×2 IMPLANT
BLADE SURG SZ10 CARB STEEL (BLADE) ×4 IMPLANT
BNDG ELASTIC 6X5.8 VLCR STR LF (GAUZE/BANDAGES/DRESSINGS) ×2 IMPLANT
BOWL SMART MIX CTS (DISPOSABLE) ×2 IMPLANT
CEMENT HV SMART SET (Cement) ×4 IMPLANT
COVER SURGICAL LIGHT HANDLE (MISCELLANEOUS) ×2 IMPLANT
COVER WAND RF STERILE (DRAPES) IMPLANT
CUFF TOURN SGL QUICK 34 (TOURNIQUET CUFF) ×1
CUFF TRNQT CYL 34X4.125X (TOURNIQUET CUFF) ×1 IMPLANT
DECANTER SPIKE VIAL GLASS SM (MISCELLANEOUS) ×4 IMPLANT
DRAPE U-SHAPE 47X51 STRL (DRAPES) ×2 IMPLANT
DRSG ADAPTIC 3X8 NADH LF (GAUZE/BANDAGES/DRESSINGS) ×2 IMPLANT
DRSG PAD ABDOMINAL 8X10 ST (GAUZE/BANDAGES/DRESSINGS) ×2 IMPLANT
DURAPREP 26ML APPLICATOR (WOUND CARE) ×2 IMPLANT
ELECT REM PT RETURN 15FT ADLT (MISCELLANEOUS) ×2 IMPLANT
EVACUATOR 1/8 PVC DRAIN (DRAIN) ×2 IMPLANT
GAUZE SPONGE 4X4 12PLY STRL (GAUZE/BANDAGES/DRESSINGS) ×2 IMPLANT
GLOVE BIO SURGEON STRL SZ7 (GLOVE) ×2 IMPLANT
GLOVE BIO SURGEON STRL SZ8 (GLOVE) ×2 IMPLANT
GLOVE BIOGEL PI IND STRL 6.5 (GLOVE) ×1 IMPLANT
GLOVE BIOGEL PI IND STRL 7.0 (GLOVE) ×1 IMPLANT
GLOVE BIOGEL PI IND STRL 8 (GLOVE) ×1 IMPLANT
GLOVE BIOGEL PI INDICATOR 6.5 (GLOVE) ×1
GLOVE BIOGEL PI INDICATOR 7.0 (GLOVE) ×1
GLOVE BIOGEL PI INDICATOR 8 (GLOVE) ×1
GLOVE SURG SS PI 6.5 STRL IVOR (GLOVE) ×2 IMPLANT
GOWN STRL REUS W/TWL LRG LVL3 (GOWN DISPOSABLE) ×6 IMPLANT
HANDPIECE INTERPULSE COAX TIP (DISPOSABLE) ×1
HOLDER FOLEY CATH W/STRAP (MISCELLANEOUS) ×2 IMPLANT
IMMOBILIZER KNEE 20 (SOFTGOODS) ×2
IMMOBILIZER KNEE 20 THIGH 36 (SOFTGOODS) ×1 IMPLANT
KIT TURNOVER KIT A (KITS) IMPLANT
MANIFOLD NEPTUNE II (INSTRUMENTS) ×2 IMPLANT
NS IRRIG 1000ML POUR BTL (IV SOLUTION) ×2 IMPLANT
PACK TOTAL KNEE CUSTOM (KITS) ×2 IMPLANT
PADDING CAST COTTON 6X4 STRL (CAST SUPPLIES) ×4 IMPLANT
PIN STEINMAN FIXATION KNEE (PIN) ×2 IMPLANT
PROTECTOR NERVE ULNAR (MISCELLANEOUS) ×2 IMPLANT
SET HNDPC FAN SPRY TIP SCT (DISPOSABLE) ×1 IMPLANT
STRIP CLOSURE SKIN 1/2X4 (GAUZE/BANDAGES/DRESSINGS) ×4 IMPLANT
SUT MNCRL AB 4-0 PS2 18 (SUTURE) ×2 IMPLANT
SUT STRATAFIX 0 PDS 27 VIOLET (SUTURE) ×2
SUT VIC AB 2-0 CT1 27 (SUTURE) ×3
SUT VIC AB 2-0 CT1 TAPERPNT 27 (SUTURE) ×3 IMPLANT
SUTURE STRATFX 0 PDS 27 VIOLET (SUTURE) ×1 IMPLANT
TIBIAL BASE ROT PLAT SZ 7 KNEE (Knees) ×2 IMPLANT
TRAY CATH 16FR W/PLASTIC CATH (SET/KITS/TRAYS/PACK) ×2 IMPLANT
TRAY FOLEY MTR SLVR 16FR STAT (SET/KITS/TRAYS/PACK) IMPLANT
WATER STERILE IRR 1000ML POUR (IV SOLUTION) ×4 IMPLANT
WRAP KNEE MAXI GEL POST OP (GAUZE/BANDAGES/DRESSINGS) ×2 IMPLANT
YANKAUER SUCT BULB TIP 10FT TU (MISCELLANEOUS) ×2 IMPLANT

## 2018-12-05 NOTE — Progress Notes (Signed)
AssistedDr. Oddono with right, ultrasound guided, adductor canal block. Side rails up, monitors on throughout procedure. See vital signs in flow sheet. Tolerated Procedure well.  

## 2018-12-05 NOTE — Anesthesia Procedure Notes (Signed)
Procedure Name: MAC Date/Time: 12/05/2018 11:49 AM Performed by: Deliah Boston, CRNA Pre-anesthesia Checklist: Patient identified, Emergency Drugs available, Suction available and Patient being monitored Patient Re-evaluated:Patient Re-evaluated prior to induction Oxygen Delivery Method: Nasal cannula Induction Type: IV induction Placement Confirmation: positive ETCO2 and breath sounds checked- equal and bilateral

## 2018-12-05 NOTE — Discharge Instructions (Signed)
° °Dr. Frank Aluisio °Total Joint Specialist °[facility] °[address] °[place], [place] [address] °[phone] ° °TOTAL KNEE REPLACEMENT POSTOPERATIVE DIRECTIONS ° °Knee Rehabilitation, Guidelines Following Surgery  °Results after knee surgery are often greatly improved when you follow the exercise, range of motion and muscle strengthening exercises prescribed by your doctor. Safety measures are also important to protect the knee from further injury. Any time any of these exercises cause you to have increased pain or swelling in your knee joint, decrease the amount until you are comfortable again and slowly increase them. If you have problems or questions, call your caregiver or physical therapist for advice.  ° °HOME CARE INSTRUCTIONS  °• Remove items at home which could result in a fall. This includes throw rugs or furniture in walking pathways.  °· ICE to the affected knee every three hours for 30 minutes at a time and then as needed for pain and swelling.  Continue to use ice on the knee for pain and swelling from surgery. You may notice swelling that will progress down to the foot and ankle.  This is normal after surgery.  Elevate the leg when you are not up walking on it.   °· Continue to use the breathing machine which will help keep your temperature down.  It is common for your temperature to cycle up and down following surgery, especially at night when you are not up moving around and exerting yourself.  The breathing machine keeps your lungs expanded and your temperature down. °· Do not place pillow under knee, focus on keeping the knee straight while resting ° °DIET °You may resume your previous home diet once your are discharged from the hospital. ° °DRESSING / WOUND CARE / SHOWERING °You may change your dressing 3-5 days after surgery.  Then change the dressing every day with sterile gauze.  Please use good hand washing techniques before changing the dressing.  Do not use any lotions  or creams on the incision until instructed by your surgeon. °You may start showering once you are discharged home but do not submerge the incision under water. Just pat the incision dry and apply a dry gauze dressing on daily. °Change the surgical dressing daily and reapply a dry dressing each time. ° °ACTIVITY °Walk with your walker as instructed. °Use walker as long as suggested by your caregivers. °Avoid periods of inactivity such as sitting longer than an hour when not asleep. This helps prevent blood clots.  °You may resume a sexual relationship in one month or when given the OK by your doctor.  °You may return to work once you are cleared by your doctor.  °Do not drive a car for 6 weeks or until released by you surgeon.  °Do not drive while taking narcotics. ° °WEIGHT BEARING °Weight bearing as tolerated with assist device (walker, cane, etc) as directed, use it as long as suggested by your surgeon or therapist, typically at least 4-6 weeks. ° °POSTOPERATIVE CONSTIPATION PROTOCOL °Constipation - defined medically as fewer than three stools per week and severe constipation as less than one stool per week. ° °One of the most common issues patients have following surgery is constipation.  Even if you have a regular bowel pattern at home, your normal regimen is likely to be disrupted due to multiple reasons following surgery.  Combination of anesthesia, postoperative narcotics, change in appetite and fluid intake all can affect your bowels.  In order to avoid complications following surgery, here are some   recommendations in order to help you during your recovery period. ° °Colace (docusate) - Pick up an over-the-counter form of Colace or another stool softener and take twice a day as long as you are requiring postoperative pain medications.  Take with a full glass of water daily.  If you experience loose stools or diarrhea, hold the colace until you stool forms back up.  If your symptoms do not get better within 1  week or if they get worse, check with your doctor. ° °Dulcolax (bisacodyl) - Pick up over-the-counter and take as directed by the product packaging as needed to assist with the movement of your bowels.  Take with a full glass of water.  Use this product as needed if not relieved by Colace only.  ° °MiraLax (polyethylene glycol) - Pick up over-the-counter to have on hand.  MiraLax is a solution that will increase the amount of water in your bowels to assist with bowel movements.  Take as directed and can mix with a glass of water, juice, soda, coffee, or tea.  Take if you go more than two days without a movement. °Do not use MiraLax more than once per day. Call your doctor if you are still constipated or irregular after using this medication for 7 days in a row. ° °If you continue to have problems with postoperative constipation, please contact the office for further assistance and recommendations.  If you experience "the worst abdominal pain ever" or develop nausea or vomiting, please contact the office immediatly for further recommendations for treatment. ° °ITCHING °If you experience itching with your medications, try taking only a single pain pill, or even half a pain pill at a time.  You can also use Benadryl over the counter for itching or also to help with sleep.  ° °TED HOSE STOCKINGS °Wear the elastic stockings on both legs for three weeks following surgery during the day but you may remove then at night for sleeping. ° °MEDICATIONS °See your medication summary on the “After Visit Summary” that the nursing staff will review with you prior to discharge.  You may have some home medications which will be placed on hold until you complete the course of blood thinner medication.  It is important for you to complete the blood thinner medication as prescribed by your surgeon.  Continue your approved medications as instructed at time of discharge. ° °PRECAUTIONS °If you experience chest pain or shortness of breath -  call 911 immediately for transfer to the hospital emergency department.  °If you develop a fever greater that 101 F, purulent drainage from wound, increased redness or drainage from wound, foul odor from the wound/dressing, or calf pain - CONTACT YOUR SURGEON.   °                                                °FOLLOW-UP APPOINTMENTS °Make sure you keep all of your appointments after your operation with your surgeon and caregivers. You should call the office at the above phone number and make an appointment for approximately two weeks after the date of your surgery or on the date instructed by your surgeon outlined in the "After Visit Summary". ° °RANGE OF MOTION AND STRENGTHENING EXERCISES  °Rehabilitation of the knee is important following a knee injury or an operation. After just a few days of immobilization, the muscles of   the thigh which control the knee become weakened and shrink (atrophy). Knee exercises are designed to build up the tone and strength of the thigh muscles and to improve knee motion. Often times heat used for twenty to thirty minutes before working out will loosen up your tissues and help with improving the range of motion but do not use heat for the first two weeks following surgery. These exercises can be done on a training (exercise) mat, on the floor, on a table or on a bed. Use what ever works the best and is most comfortable for you Knee exercises include:  °• Leg Lifts - While your knee is still immobilized in a splint or cast, you can do straight leg raises. Lift the leg to 60 degrees, hold for 3 sec, and slowly lower the leg. Repeat 10-20 times 2-3 times daily. Perform this exercise against resistance later as your knee gets better.  °• Quad and Hamstring Sets - Tighten up the muscle on the front of the thigh (Quad) and hold for 5-10 sec. Repeat this 10-20 times hourly. Hamstring sets are done by pushing the foot backward against an object and holding for 5-10 sec. Repeat as with quad  sets.  °· Leg Slides: Lying on your back, slowly slide your foot toward your buttocks, bending your knee up off the floor (only go as far as is comfortable). Then slowly slide your foot back down until your leg is flat on the floor again. °· Angel Wings: Lying on your back spread your legs to the side as far apart as you can without causing discomfort.  °A rehabilitation program following serious knee injuries can speed recovery and prevent re-injury in the future due to weakened muscles. Contact your doctor or a physical therapist for more information on knee rehabilitation.  ° °IF YOU ARE TRANSFERRED TO A SKILLED REHAB FACILITY °If the patient is transferred to a skilled rehab facility following release from the hospital, a list of the current medications will be sent to the facility for the patient to continue.  When discharged from the skilled rehab facility, please have the facility set up the patient's Home Health Physical Therapy prior to being released. Also, the skilled facility will be responsible for providing the patient with their medications at time of release from the facility to include their pain medication, the muscle relaxants, and their blood thinner medication. If the patient is still at the rehab facility at time of the two week follow up appointment, the skilled rehab facility will also need to assist the patient in arranging follow up appointment in our office and any transportation needs. ° °MAKE SURE YOU:  °• Understand these instructions.  °• Get help right away if you are not doing well or get worse.  ° ° °Pick up stool softner and laxative for home use following surgery while on pain medications. °Do not submerge incision under water. °Please use good hand washing techniques while changing dressing each day. °May shower starting three days after surgery. °Please use a clean towel to pat the incision dry following showers. °Continue to use ice for pain and swelling after surgery. °Do not  use any lotions or creams on the incision until instructed by your surgeon. ° °

## 2018-12-05 NOTE — Anesthesia Procedure Notes (Signed)
Procedure Name: MAC Date/Time: 12/05/2018 12:08 PM Performed by: Deliah Boston, CRNA Pre-anesthesia Checklist: Patient identified, Emergency Drugs available, Suction available and Patient being monitored Patient Re-evaluated:Patient Re-evaluated prior to induction Oxygen Delivery Method: Simple face mask Induction Type: IV induction Placement Confirmation: positive ETCO2 and breath sounds checked- equal and bilateral

## 2018-12-05 NOTE — Evaluation (Signed)
Physical Therapy Evaluation Patient Details Name: [name] MRN: [id number] DOB: [birth year] Today's Date: 12/05/2018   History of Present Illness  68 yo male s/p R TKR on 12/05/18. PMH includes eczema, nephrolithasis, HLD, PNA, C6-C7 ACDF 2008, R RTC repair 2018.  Clinical Impression  Pt presents with R knee pain, post-operative RLE weakness, difficulty performing bed mobility, increased time and effort to perform mobility tasks, and decreased activity tolerance. Pt to benefit from acute PT to address deficits. Pt ambulated short room distance, limited by antalgic gait. Pt educated on ankle pumps (20/hour) to perform this afternoon/evening to increase circulation, to pt's tolerance and limited by pain. PT to progress mobility as tolerated, and will continue to follow acutely.        Follow Up Recommendations Follow surgeon's recommendation for DC plan and follow-up therapies;Supervision for mobility/OOB (pt requesting HHPT, currently set up for OPPT)    Equipment Recommendations  None recommended by PT    Recommendations for Other Services       Precautions / Restrictions Precautions Precautions: Fall Required Braces or Orthoses: Knee Immobilizer - Right Knee Immobilizer - Right: On when out of bed or walking;Discontinue once straight leg raise with < 10 degree lag Restrictions Weight Bearing Restrictions: No Other Position/Activity Restrictions: WBAT      Mobility  Bed Mobility Overal bed mobility: Needs Assistance Bed Mobility: Supine to Sit     Supine to sit: Min assist;HOB elevated     General bed mobility comments: min assist for RLE management, increased time and effort to scoot to EOB.  Transfers Overall transfer level: Needs assistance Equipment used: Rolling walker (2 wheeled) Transfers: Sit to/from Stand Sit to Stand: Min guard;From elevated surface         General transfer comment: Min guard for safety, verbal cuing for hand placement when  rising.  Ambulation/Gait Ambulation/Gait assistance: Min guard Gait Distance (Feet): 10 Feet Assistive device: Rolling walker (2 wheeled) Gait Pattern/deviations: Step-to pattern;Decreased step length - right;Decreased step length - left;Decreased stance time - right;Decreased weight shift to right;Antalgic;Wide base of support Gait velocity: decr   General Gait Details: min guard for safety, verbal cuing for sequencing, placement in RW, turning with RW. Pt limited by increasingly antalgic gait  Stairs            Wheelchair Mobility    Modified Rankin (Stroke Patients Only)       Balance Overall balance assessment: Mild deficits observed, not formally tested                                           Pertinent Vitals/Pain Pain Assessment: 0-10 Pain Score: 4  Pain Location: R knee Pain Descriptors / Indicators: Sore Pain Intervention(s): Limited activity within patient's tolerance;Monitored during session;Premedicated before session;Repositioned;Ice applied    Home Living Family/patient expects to be discharged to:: Private residence Living Arrangements: Spouse/significant other Available Help at Discharge: Family;Available 24 hours/day Type of Home: House Home Access: Stairs to enter Entrance Stairs-Rails: None Entrance Stairs-Number of Steps: 2 Home Layout: One level Home Equipment: Walker - 2 wheels;Cane - single point;Tub bench      Prior Function Level of Independence: Independent with assistive device(s)         Comments: pt reports being very active up until 4 months ago, started using cane for ambulation was was not able to move as well. Pt likes to work in  his storage building and RV     Hand Dominance   Dominant Hand: Right    Extremity/Trunk Assessment   Upper Extremity Assessment Upper Extremity Assessment: Overall WFL for tasks assessed    Lower Extremity Assessment Lower Extremity Assessment: Overall WFL for tasks  assessed;RLE deficits/detail RLE Deficits / Details: post-surgical weakness; able to perform ankle pumps, quad set, heel slide to 75*, SLR with >10* quad lag RLE Sensation: WNL;decreased light touch(gluteal region with decreased sensation)    Cervical / Trunk Assessment Cervical / Trunk Assessment: Normal  Communication   Communication: No difficulties  Cognition Arousal/Alertness: Awake/alert Behavior During Therapy: WFL for tasks assessed/performed Overall Cognitive Status: Within Functional Limits for tasks assessed                                        General Comments      Exercises     Assessment/Plan    PT Assessment Patient needs continued PT services  PT Problem List Decreased strength;Decreased mobility;Decreased range of motion;Decreased activity tolerance;Decreased balance;Decreased knowledge of use of DME;Pain       PT Treatment Interventions DME instruction;Therapeutic activities;Gait training;Therapeutic exercise;Patient/family education;Balance training;Stair training;Functional mobility training    PT Goals (Current goals can be found in the Care Plan section)  Acute Rehab PT Goals Patient Stated Goal: go home PT Goal Formulation: With patient Time For Goal Achievement: 12/12/18 Potential to Achieve Goals: Good    Frequency 7X/week   Barriers to discharge        Co-evaluation               AM-PAC PT "6 Clicks" Mobility  Outcome Measure Help needed turning from your back to your side while in a flat bed without using bedrails?: A Little Help needed moving from lying on your back to sitting on the side of a flat bed without using bedrails?: A Little Help needed moving to and from a bed to a chair (including a wheelchair)?: A Little Help needed standing up from a chair using your arms (e.g., wheelchair or bedside chair)?: A Little Help needed to walk in hospital room?: A Little Help needed climbing 3-5 steps with a railing? : A  Little 6 Click Score: 18    End of Session Equipment Utilized During Treatment: Gait belt;Right knee immobilizer Activity Tolerance: Patient tolerated treatment well;Patient limited by pain Patient left: in chair;with call bell/phone within reach;with chair alarm set;with SCD's reapplied Nurse Communication: Mobility status PT Visit Diagnosis: Difficulty in walking, not elsewhere classified (R26.2);Other abnormalities of gait and mobility (R26.89)    Time: 1835-1901 PT Time Calculation (min) (ACUTE ONLY): 26 min   Charges:   PT Evaluation $PT Eval Low Complexity: 1 Low PT Treatments $Gait Training: 8-22 mins        Julien Girt, PT Acute Rehabilitation Services Pager [phone]  Office [phone]   Roxine Caddy D Elonda Husky 12/05/2018, 7:22 PM

## 2018-12-05 NOTE — Interval H&P Note (Signed)
History and Physical Interval Note:  12/05/2018 9:37 AM  [name]  has presented today for surgery, with the diagnosis of right knee osteoarthritis.  The various methods of treatment have been discussed with the patient and family. After consideration of risks, benefits and other options for treatment, the patient has consented to  Procedure(s) with comments: TOTAL KNEE ARTHROPLASTY (Right) - 51min as a surgical intervention.  The patient's history has been reviewed, patient examined, no change in status, stable for surgery.  I have reviewed the patient's chart and labs.  Questions were answered to the patient's satisfaction.     Pilar Plate Shaleka Brines

## 2018-12-05 NOTE — Anesthesia Procedure Notes (Signed)
Spinal  Patient location during procedure: OR Start time: 12/05/2018 11:50 AM End time: 12/05/2018 11:53 AM Staffing Anesthesiologist: Janeece Riggers, MD Preanesthetic Checklist Completed: patient identified, site marked, surgical consent, pre-op evaluation, timeout performed, IV checked, risks and benefits discussed and monitors and equipment checked Spinal Block Patient position: sitting Prep: DuraPrep Patient monitoring: heart rate, cardiac monitor, continuous pulse ox and blood pressure Approach: midline Location: L3-4 Injection technique: single-shot Needle Needle type: Sprotte  Needle gauge: 24 G Needle length: 9 cm Assessment Sensory level: T4

## 2018-12-05 NOTE — Op Note (Signed)
OPERATIVE REPORT-TOTAL KNEE ARTHROPLASTY   Pre-operative diagnosis- Osteoarthritis  Right knee(s)  Post-operative diagnosis- Osteoarthritis Right knee(s)  Procedure-  Right  Total Knee Arthroplasty  Surgeon- Dione Plover. Jkai Arwood, MD  Assistant- Ardeen Jourdain, PA-C   Anesthesia-  Adductor canal block and spinal  EBL-25 mL   Drains Hemovac  Tourniquet time-  Total Tourniquet Time Documented: Thigh (Right) - 44 minutes Total: Thigh (Right) - 44 minutes     Complications- None  Condition-PACU - hemodynamically stable.   Brief Clinical Note  [name] is a 68 y.o. year old male with end stage OA of his right knee with progressively worsening pain and dysfunction. He has constant pain, with activity and at rest and significant functional deficits with difficulties even with ADLs. He has had extensive non-op management including analgesics, injections of cortisone and viscosupplements, and home exercise program, but remains in significant pain with significant dysfunction. Radiographs show bone on bone arthritis medial compartment. He presents now for right Total Knee Arthroplasty.    Procedure in detail---   The patient is brought into the operating room and positioned supine on the operating table. After successful administration of  Adductor canal block and spinal,   a tourniquet is placed high on the  Right thigh(s) and the lower extremity is prepped and draped in the usual sterile fashion. Time out is performed by the operating team and then the  Right lower extremity is wrapped in Esmarch, knee flexed and the tourniquet inflated to 300 mmHg.       A midline incision is made with a ten blade through the subcutaneous tissue to the level of the extensor mechanism. A fresh blade is used to make a medial parapatellar arthrotomy. Soft tissue over the proximal medial tibia is subperiosteally elevated to the joint line with a knife and into the semimembranosus bursa with a Cobb  elevator. Soft tissue over the proximal lateral tibia is elevated with attention being paid to avoiding the patellar tendon on the tibial tubercle. The patella is everted, knee flexed 90 degrees and the ACL and PCL are removed. Findings are bone on bone medial and patellofemoral with large global osteophytes        The drill is used to create a starting hole in the distal femur and the canal is thoroughly irrigated with sterile saline to remove the fatty contents. The 5 degree Right  valgus alignment guide is placed into the femoral canal and the distal femoral cutting block is pinned to remove 9 mm off the distal femur. Resection is made with an oscillating saw.      The tibia is subluxed forward and the menisci are removed. The extramedullary alignment guide is placed referencing proximally at the medial aspect of the tibial tubercle and distally along the second metatarsal axis and tibial crest. The block is pinned to remove 59mm off the more deficient medial  side. Resection is made with an oscillating saw. Size 7is the most appropriate size for the tibia and the proximal tibia is prepared with the modular drill and keel punch for that size.      The femoral sizing guide is placed and size 7 is most appropriate. Rotation is marked off the epicondylar axis and confirmed by creating a rectangular flexion gap at 90 degrees. The size 7 cutting block is pinned in this rotation and the anterior, posterior and chamfer cuts are made with the oscillating saw. The intercondylar block is then placed and that cut is made.  Trial size 7 tibial component, trial size 7 posterior stabilized femur and a 12  mm posterior stabilized rotating platform insert trial is placed. Full extension is achieved with excellent varus/valgus and anterior/posterior balance throughout full range of motion. The patella is everted and thickness measured to be 27  mm. Free hand resection is taken to 15 mm, a 41 template is placed, lug holes  are drilled, trial patella is placed, and it tracks normally. Osteophytes are removed off the posterior femur with the trial in place. All trials are removed and the cut bone surfaces prepared with pulsatile lavage. Cement is mixed and once ready for implantation, the size 7 tibial implant, size  7 posterior stabilized femoral component, and the size 41 patella are cemented in place and the patella is held with the clamp. The trial insert is placed and the knee held in full extension. The Exparel (20 ml mixed with 60 ml saline) is injected into the extensor mechanism, posterior capsule, medial and lateral gutters and subcutaneous tissues.  All extruded cement is removed and once the cement is hard the permanent 12 mm posterior stabilized rotating platform insert is placed into the tibial tray.      The wound is copiously irrigated with saline solution and the extensor mechanism closed over a hemovac drain with #1 V-loc suture. The tourniquet is released for a total tourniquet time of 44  minutes. Flexion against gravity is 140 degrees and the patella tracks normally. Subcutaneous tissue is closed with 2.0 vicryl and subcuticular with running 4.0 Monocryl. The incision is cleaned and dried and steri-strips and a bulky sterile dressing are applied. The limb is placed into a knee immobilizer and the patient is awakened and transported to recovery in stable condition.      Please note that a surgical assistant was a medical necessity for this procedure in order to perform it in a safe and expeditious manner. Surgical assistant was necessary to retract the ligaments and vital neurovascular structures to prevent injury to them and also necessary for proper positioning of the limb to allow for anatomic placement of the prosthesis.   Dione Plover Kariyah Baugh, MD    12/05/2018, 1:02 PM

## 2018-12-05 NOTE — Transfer of Care (Signed)
Immediate Anesthesia Transfer of Care Note  Patient: [name]  Procedure(s) Performed: Procedure(s) with comments: TOTAL KNEE ARTHROPLASTY (Right) - 39mn  Patient Location: PACU  Anesthesia Type:MAC, Regional and Spinal  Level of Consciousness: Patient easily awoken, sedated, comfortable, cooperative, following commands, responds to stimulation.   Airway & Oxygen Therapy: Patient spontaneously breathing, ventilating well, oxygen via simple oxygen mask.  Post-op Assessment: Report given to PACU RN, vital signs reviewed and stable.   Post vital signs: Reviewed and stable.  Complications: No apparent anesthesia complications  Last Vitals:  Vitals Value Taken Time  BP 122/68 12/05/18 1322  Temp    Pulse 78 12/05/18 1324  Resp 15 12/05/18 1324  SpO2 100 % 12/05/18 1324  Vitals shown include unvalidated device data.  Last Pain:  Vitals:   12/05/18 1116  TempSrc:   PainSc: 4       Patients Stated Pain Goal: 4 ([id number]  Complications: No apparent anesthesia complications

## 2018-12-05 NOTE — Anesthesia Procedure Notes (Addendum)
Anesthesia Regional Block: Adductor canal block   Pre-Anesthetic Checklist: ,, timeout performed, Correct Patient, Correct Site, Correct Laterality, Correct Procedure, Correct Position, site marked, Risks and benefits discussed,  Surgical consent,  Pre-op evaluation,  At surgeon's request and post-op pain management  Laterality: Right  Prep: chloraprep       Needles:  Injection technique: Single-shot  Needle Type: Echogenic Stimulator Needle     Needle Length: 5cm  Needle Gauge: 22     Additional Needles:   Procedures:, nerve stimulator,,, ultrasound used (permanent image in chart),,,,   Nerve Stimulator or Paresthesia:  Response: quadraceps contraction, 0.45 mA,   Additional Responses:   Narrative:  Start time: 12/05/2018 11:18 AM End time: 12/05/2018 11:26 AM Injection made incrementally with aspirations every 5 mL.  Performed by: Personally  Anesthesiologist: Janeece Riggers, MD  Additional Notes: Functioning IV was confirmed and monitors were applied.  A 78mm 22ga Arrow echogenic stimulator needle was used. Sterile prep and drape,hand hygiene and sterile gloves were used. Ultrasound guidance: relevant anatomy identified, needle position confirmed, local anesthetic spread visualized around nerve(s)., vascular puncture avoided.  Image printed for medical record. Negative aspiration and negative test dose prior to incremental administration of local anesthetic. The patient tolerated the procedure well.

## 2018-12-06 ENCOUNTER — Encounter (HOSPITAL_COMMUNITY): Payer: Self-pay | Admitting: Orthopedic Surgery

## 2018-12-06 LAB — BASIC METABOLIC PANEL
Anion gap: 10 (ref 5–15)
BUN: 12 mg/dL (ref 8–23)
CO2: 22 mmol/L (ref 22–32)
Calcium: 8.6 mg/dL — ABNORMAL LOW (ref 8.9–10.3)
Chloride: 104 mmol/L (ref 98–111)
Creatinine, Ser: 1.08 mg/dL (ref 0.61–1.24)
GFR calc Af Amer: >60 mL/min (ref >60)
GFR calc non Af Amer: >60 mL/min (ref >60)
Glucose, Bld: 174 mg/dL — ABNORMAL HIGH (ref 70–99)
Potassium: 4.4 mmol/L (ref 3.5–5.1)
Sodium: 136 mmol/L (ref 135–145)

## 2018-12-06 LAB — CBC
HCT: 39.3 % (ref 39.0–52.0)
Hemoglobin: 13.3 g/dL (ref 13.0–17.0)
MCH: 32.8 pg (ref 26.0–34.0)
MCHC: 33.8 g/dL (ref 30.0–36.0)
MCV: 96.8 fL (ref 80.0–100.0)
Platelets: 204 10*3/uL (ref 150–400)
RBC: 4.06 MIL/uL — ABNORMAL LOW (ref 4.22–5.81)
RDW: 12 % (ref 11.5–15.5)
WBC: 13.5 10*3/uL — ABNORMAL HIGH (ref 4.0–10.5)
nRBC: 0 % (ref 0.0–0.2)

## 2018-12-06 MED ORDER — METHOCARBAMOL 500 MG PO TABS: 500.0000 mg | ORAL_TABLET | Freq: Four times a day (QID) | ORAL | 0 refills | Status: DC | PRN

## 2018-12-06 MED ORDER — GABAPENTIN 300 MG PO CAPS: 300.0000 mg | ORAL_CAPSULE | Freq: Three times a day (TID) | ORAL | 0 refills | Status: DC

## 2018-12-06 MED ORDER — OXYCODONE HCL 5 MG PO TABS: 5.0000 mg | ORAL_TABLET | Freq: Four times a day (QID) | ORAL | 0 refills | Status: DC | PRN

## 2018-12-06 MED ORDER — ASPIRIN 325 MG PO TBEC: 325.0000 mg | DELAYED_RELEASE_TABLET | Freq: Two times a day (BID) | ORAL | 0 refills | Status: AC

## 2018-12-06 MED ORDER — TRAMADOL HCL 50 MG PO TABS: 50.0000 mg | ORAL_TABLET | Freq: Four times a day (QID) | ORAL | 0 refills | Status: DC | PRN

## 2018-12-06 NOTE — Progress Notes (Signed)
Physical Therapy Treatment Patient Details Name: [name] MRN: [id number] DOB: 1951/02/20 Today's Date: 12/06/2018    History of Present Illness 68 yo male s/p R TKR on 12/05/18. PMH includes eczema, nephrolithasis, HLD, PNA, C6-C7 ACDF 2008, R RTC repair 2018.    PT Comments    POD # 1 Pt amb around room without assist eager to "get home".  Educated on safety but pt was using his walker.  Impulsive.  Assisted with amb in hallway.  General Gait Details: 25% VC's on safety with turns.  Practiced stairs.  General stair comments: 50% VC's on proper sequencing and performed twice.  Then returned to room to perform some TE's following HEP handout.  Instructed on proper tech, freq as well as use of ICE.   Addressed all mobility questions, discussed appropriate activity, educated on use of ICE.  Pt ready for D/C to home.   Follow Up Recommendations  Follow surgeon's recommendation for DC plan and follow-up therapies;Supervision for mobility/OOB     Equipment Recommendations  None recommended by PT    Recommendations for Other Services       Precautions / Restrictions Precautions Precautions: Fall Restrictions Weight Bearing Restrictions: No Other Position/Activity Restrictions: WBAT    Mobility  Bed Mobility               General bed mobility comments: OOB  Transfers Overall transfer level: Needs assistance Equipment used: Rolling walker (2 wheeled) Transfers: Sit to/from Stand Sit to Stand: Supervision         General transfer comment: Min guard for safety, verbal cuing for hand placement when rising.  Ambulation/Gait Ambulation/Gait assistance: Supervision;Min guard Gait Distance (Feet): 45 Feet Assistive device: Rolling walker (2 wheeled) Gait Pattern/deviations: Step-to pattern;Decreased step length - right;Decreased step length - left;Decreased stance time - right;Decreased weight shift to right;Antalgic;Wide base of support Gait velocity: decreased    General Gait Details: 25% VC's on safety with turns   Stairs Stairs: Yes Stairs assistance: Min guard;Min assist Stair Management: No rails;Step to pattern;Forwards Number of Stairs: 2 General stair comments: 50% VC's on proper sequencing and performed twice   Wheelchair Mobility    Modified Rankin (Stroke Patients Only)       Balance                                            Cognition Arousal/Alertness: Awake/alert Behavior During Therapy: WFL for tasks assessed/performed Overall Cognitive Status: Within Functional Limits for tasks assessed                                 General Comments: impulsive      Exercises   Total Knee Replacement TE's 10 reps B LE ankle pumps 10 reps towel squeezes 10 reps knee presses 10 reps heel slides  10 reps SAQ's 10 reps SLR's 10 reps ABD Followed by ICE    General Comments        Pertinent Vitals/Pain Pain Assessment: Faces Faces Pain Scale: Hurts a little bit Pain Location: R knee Pain Descriptors / Indicators: Sore Pain Intervention(s): Monitored during session;Repositioned;Ice applied    Home Living                      Prior Function  PT Goals (current goals can now be found in the care plan section) Progress towards PT goals: Progressing toward goals    Frequency    7X/week      PT Plan Current plan remains appropriate    Co-evaluation              AM-PAC PT "6 Clicks" Mobility   Outcome Measure  Help needed turning from your back to your side while in a flat bed without using bedrails?: A Little Help needed moving from lying on your back to sitting on the side of a flat bed without using bedrails?: A Little Help needed moving to and from a bed to a chair (including a wheelchair)?: A Little Help needed standing up from a chair using your arms (e.g., wheelchair or bedside chair)?: A Little Help needed to walk in hospital room?: A  Little Help needed climbing 3-5 steps with a railing? : A Little 6 Click Score: 18    End of Session Equipment Utilized During Treatment: Gait belt Activity Tolerance: Patient tolerated treatment well;Patient limited by pain Patient left: in chair;with call bell/phone within reach;with chair alarm set;with SCD's reapplied Nurse Communication: Mobility status PT Visit Diagnosis: Difficulty in walking, not elsewhere classified (R26.2);Other abnormalities of gait and mobility (R26.89)     Time: 4081-4481 PT Time Calculation (min) (ACUTE ONLY): 27 min  Charges:  $Gait Training: 8-22 mins $Therapeutic Exercise: 8-22 mins                     Rica Koyanagi  PTA Acute  Rehabilitation Services Pager      [phone] Office      [phone]

## 2018-12-06 NOTE — Progress Notes (Signed)
Subjective: 1 Day Post-Op Procedure(s) (LRB): LEFT ARTHROSCOPY, CHONDROPLASTY (Right) Patient reports pain as mild.   Patient seen in rounds by Dr. Wynelle Ellis. Patient is well, and has had no acute complaints or problems other than pain in the right knee. Denies chest pain, SOB, or calf pain. Foley catheter removed this AM. No issues overnight.  We will continue therapy today.   Objective: Vital signs in last 24 hours: Temp:  [97.5 F (36.4 C)-98.3 F (36.8 C)] 97.8 F (36.6 C) (08/11 0503) Pulse Rate:  [60-93] 60 (08/11 0503) Resp:  [10-19] 18 (08/11 0503) BP: (104-136)/(66-87) 124/82 (08/11 0503) SpO2:  [97 %-100 %] 100 % (08/11 0503) Weight:  [117.1 kg] 117.1 kg (08/10 0926)  Intake/Output from previous day:  Intake/Output Summary (Last 24 hours) at 12/06/2018 0729 Last data filed at 12/06/2018 0612 Gross per 24 hour  Intake 3645.23 ml  Output 3636 ml  Net 9.23 ml    Labs: Recent Labs    12/06/18 0232  HGB 13.3   Recent Labs    12/06/18 0232  WBC 13.5*  RBC 4.06*  HCT 39.3  PLT 204   Recent Labs    12/06/18 0232  NA 136  K 4.4  CL 104  CO2 22  BUN 12  CREATININE 1.08  GLUCOSE 174*  CALCIUM 8.6*   Exam: General - Patient is Alert and Oriented Extremity - Neurologically intact Neurovascular intact Sensation intact distally Dorsiflexion/Plantar flexion intact Dressing - dressing C/D/I Motor Function - intact, moving foot and toes well on exam.   Past Medical History:  Diagnosis Date  . Anxiety   . Arthritis   . Asthma   . At risk for sleep apnea    STOP-BANG = 4  SENT TO PCP 05-24-2013  . Eczema   . Edema of foot   . Finger wound, simple, open    left middle index finger - stitches and dressing occured on 09/22/2016- followed by Dr Daws at [facility]   . H/O hiatal hernia   . History of kidney stones   . HOH (hard of hearing)   . Hyperlipidemia   . Influenza A   . Influenza B    symptoms started 05-16-2013/  dx 05-17-2013 by pcp  .  Left ankle injury    twisted left ankle   . Left ureteral calculus   . Pneumonia    hx of   . Rash    right leg- calf -   . Wears dentures    bottom    Assessment/Plan: 1 Day Post-Op Procedure(s) (LRB): LEFT ARTHROSCOPY, CHONDROPLASTY (Right) Principal Problem:   OA (osteoarthritis) of knee Active Problems:   Osteoarthritis of right knee  Estimated body mass index is 35.99 kg/m as calculated from the following:   Height as of this encounter: 5\' 11"  (1.803 m).   Weight as of this encounter: 117.1 kg. Advance diet Up with therapy D/C IV fluids  Anticipated LOS equal to or greater than 2 midnights due to - Age 4 and older with one or more of the following:  - Obesity  - Expected need for hospital services (PT, OT, Nursing) required for safe  discharge  - Anticipated need for postoperative skilled nursing care or inpatient rehab  - Active co-morbidities: None OR   - Unanticipated findings during/Post Surgery: None  - Patient is a high risk of re-admission due to: None    DVT Prophylaxis - Aspirin Weight bearing as tolerated. D/C O2 and pulse ox and try on  room air. Hemovac pulled without difficulty, will continue therapy today.  Plan is to go Home after hospital stay. Plan for discharge later today if progresses with therapy and meeting goals. Scheduled for OPPT at [facility]. Follow-up in the office in 2 weeks.   Theresa Duty, PA-C Orthopedic Surgery 12/06/2018, 7:29 AM

## 2018-12-06 NOTE — Anesthesia Postprocedure Evaluation (Signed)
Anesthesia Post Note  Patient: [name]  Procedure(s) Performed: LEFT ARTHROSCOPY, CHONDROPLASTY (Right )     Patient location during evaluation: PACU Anesthesia Type: Spinal Level of consciousness: oriented and awake and alert Pain management: pain level controlled Vital Signs Assessment: post-procedure vital signs reviewed and stable Respiratory status: spontaneous breathing, respiratory function stable and patient connected to nasal cannula oxygen Cardiovascular status: blood pressure returned to baseline and stable Postop Assessment: no headache, no backache and no apparent nausea or vomiting Anesthetic complications: no    Last Vitals:  Vitals:   12/06/18 0503 12/06/18 0918  BP: 124/82 130/79  Pulse: 60 73  Resp: 18 16  Temp: 36.6 C 36.5 C  SpO2: 100% 95%    Last Pain:  Vitals:   12/06/18 0918  TempSrc: Oral  PainSc:                  Kamica Florance

## 2018-12-06 NOTE — Progress Notes (Signed)
Therapy Plans: Lindsay PT Has RW, declines 3 in 1-states he has high commode seats/area to grab if needed

## 2018-12-07 NOTE — Discharge Summary (Signed)
Physician Discharge Summary   Patient ID: [name] MRN: [id number] DOB/AGE: 70-Jun-1952 70 y.o.  Admit date: 12/05/2018  Discharge date: 12/06/2018  Primary Diagnosis: Osteoarthritis, right knee   Admission Diagnoses:  Past Medical History:  Diagnosis Date   Anxiety    Arthritis    Asthma    At risk for sleep apnea    STOP-BANG = 4  SENT TO PCP 05-24-2013   Eczema    Edema of foot    Finger wound, simple, open    left middle index finger - stitches and dressing occured on 09/22/2016- followed by Dr Daws at [facility]    H/O hiatal hernia    History of kidney stones    HOH (hard of hearing)    Hyperlipidemia    Influenza A    Influenza B    symptoms started 05-16-2013/  dx 05-17-2013 by pcp   Left ankle injury    twisted left ankle    Left ureteral calculus    Pneumonia    hx of    Rash    right leg- calf -    Wears dentures    bottom   Discharge Diagnoses:   Principal Problem:   OA (osteoarthritis) of knee Active Problems:   Osteoarthritis of right knee  Estimated body mass index is 35.99 kg/m as calculated from the following:   Height as of this encounter: 5\' 11"  (1.803 m).   Weight as of this encounter: 117.1 kg.  Procedure:  Procedure(s) (LRB): LEFT ARTHROSCOPY, CHONDROPLASTY (Right)   Consults: None  HPI: [name] is a 68 y.o. year old male with end stage OA of his right knee with progressively worsening pain and dysfunction. He has constant pain, with activity and at rest and significant functional deficits with difficulties even with ADLs. He has had extensive non-op management including analgesics, injections of cortisone and viscosupplements, and home exercise program, but remains in significant pain with significant dysfunction. Radiographs show bone on bone arthritis medial compartment. He presents now for right Total Knee Arthroplasty.  Laboratory Data: Admission on 12/05/2018, Discharged on 12/06/2018  Component Date  Value Ref Range Status   WBC 12/06/2018 13.5* 4.0 - 10.5 K/uL Final   RBC 12/06/2018 4.06* 4.22 - 5.81 MIL/uL Final   Hemoglobin 12/06/2018 13.3  13.0 - 17.0 g/dL Final   HCT 12/06/2018 39.3  39.0 - 52.0 % Final   MCV 12/06/2018 96.8  80.0 - 100.0 fL Final   MCH 12/06/2018 32.8  26.0 - 34.0 pg Final   MCHC 12/06/2018 33.8  30.0 - 36.0 g/dL Final   RDW 12/06/2018 12.0  11.5 - 15.5 % Final   Platelets 12/06/2018 204  150 - 400 K/uL Final   nRBC 12/06/2018 0.0  0.0 - 0.2 % Final   Performed at [facility], [place] [address] [place], [place] [address]   Sodium 12/06/2018 136  135 - 145 mmol/L Final   Potassium 12/06/2018 4.4  3.5 - 5.1 mmol/L Final   Chloride 12/06/2018 104  98 - 111 mmol/L Final   CO2 12/06/2018 22  22 - 32 mmol/L Final   Glucose, Bld 12/06/2018 174* 70 - 99 mg/dL Final   BUN 12/06/2018 12  8 - 23 mg/dL Final   Creatinine, Ser 12/06/2018 1.08  0.61 - 1.24 mg/dL Final   Calcium 12/06/2018 8.6* 8.9 - 10.3 mg/dL Final   GFR calc non Af Amer 12/06/2018 >60  >60 mL/min Final   GFR calc Af Amer 12/06/2018 >  60  >60 mL/min Final   Anion gap 12/06/2018 10  5 - 15 Final   Performed at [facility], [place] [address], [place], [place] [address]  Hospital Outpatient Visit on 12/01/2018  Component Date Value Ref Range Status   SARS Coronavirus 2 12/01/2018 NEGATIVE  NEGATIVE Final   Comment: (NOTE) SARS-CoV-2 target nucleic acids are NOT DETECTED. The SARS-CoV-2 RNA is generally detectable in upper and lower respiratory specimens during the acute phase of infection. Negative results do not preclude SARS-CoV-2 infection, do not rule out co-infections with other pathogens, and should not be used as the sole basis for treatment or other patient management decisions. Negative results must be combined with clinical observations, patient history, and epidemiological information. The expected result is Negative. Fact Sheet for  Patients: SugarRoll.be Fact Sheet for Healthcare Providers: https://www.woods-mathews.com/ This test is not yet approved or cleared by the [place] FDA and  has been authorized for detection and/or diagnosis of SARS-CoV-2 by FDA under an Emergency Use Authorization (EUA). This EUA will remain  in effect (meaning this test can be used) for the duration of the COVID-19 declaration under Section 56                          4(b)(1) of the Act, 21 U.S.C. section 360bbb-3(b)(1), unless the authorization is terminated or revoked sooner. Performed at [facility], [place] [address], [place], [place] [address]   Hospital Outpatient Visit on 11/30/2018  Component Date Value Ref Range Status   aPTT 11/30/2018 31  24 - 36 seconds Final   Performed at [facility], [place] [address] [place], [place] [address]   WBC 11/30/2018 6.7  4.0 - 10.5 K/uL Final   RBC 11/30/2018 4.88  4.22 - 5.81 MIL/uL Final   Hemoglobin 11/30/2018 15.9  13.0 - 17.0 g/dL Final   HCT 11/30/2018 47.7  39.0 - 52.0 % Final   MCV 11/30/2018 97.7  80.0 - 100.0 fL Final   MCH 11/30/2018 32.6  26.0 - 34.0 pg Final   MCHC 11/30/2018 33.3  30.0 - 36.0 g/dL Final   RDW 11/30/2018 12.1  11.5 - 15.5 % Final   Platelets 11/30/2018 223  150 - 400 K/uL Final   nRBC 11/30/2018 0.0  0.0 - 0.2 % Final   Performed at [facility], [place] [address], [place], [place] [address]   Sodium 11/30/2018 139  135 - 145 mmol/L Final   Potassium 11/30/2018 4.5  3.5 - 5.1 mmol/L Final   Chloride 11/30/2018 104  98 - 111 mmol/L Final   CO2 11/30/2018 27  22 - 32 mmol/L Final   Glucose, Bld 11/30/2018 129* 70 - 99 mg/dL Final   BUN 11/30/2018 17  8 - 23 mg/dL Final   Creatinine, Ser 11/30/2018 1.11  0.61 - 1.24 mg/dL Final   Calcium 11/30/2018 9.1  8.9 - 10.3 mg/dL Final   Total Protein 11/30/2018 7.3  6.5 - 8.1 g/dL Final   Albumin 11/30/2018 4.2   3.5 - 5.0 g/dL Final   AST 11/30/2018 24  15 - 41 U/L Final   ALT 11/30/2018 29  0 - 44 U/L Final   Alkaline Phosphatase 11/30/2018 53  38 - 126 U/L Final   Total Bilirubin 11/30/2018 1.2  0.3 - 1.2 mg/dL Final   GFR calc non Af Amer 11/30/2018 >60  >60 mL/min Final   GFR calc Af Amer 11/30/2018 >60  >60  mL/min Final   Anion gap 11/30/2018 8  5 - 15 Final   Performed at [facility], [place] [address] [place], [place] [address]   Prothrombin Time 11/30/2018 14.0  11.4 - 15.2 seconds Final   INR 11/30/2018 1.1  0.8 - 1.2 Final   Comment: (NOTE) INR goal varies based on device and disease states. Performed at [facility], [place] [address], [place], [place] [address]    ABO/RH(D) 11/30/2018 A NEG   Final   Antibody Screen 11/30/2018 NEG   Final   Sample Expiration 11/30/2018 12/08/2018,2359   Final   Extend sample reason 11/30/2018    Final                   Value:NO TRANSFUSIONS OR PREGNANCY IN THE PAST 3 MONTHS Performed at [place] [address], [place], [place] [address]    MRSA, PCR 11/30/2018 NEGATIVE  NEGATIVE Final   Staphylococcus aureus 11/30/2018 POSITIVE* NEGATIVE Final   Comment: (NOTE) The Xpert SA Assay (FDA approved for NASAL specimens in patients 86 years of age and older), is one component of a comprehensive surveillance program. It is not intended to diagnose infection nor to guide or monitor treatment. Performed at [facility], [place] [address], [place], [place] [address]      X-Rays:No results found.  EKG: Orders placed or performed in visit on 11/18/18   EKG 12-Lead     Hospital Course: [name] is a 68 y.o. who was admitted to [facility]. They were brought to the operating room on 12/05/2018 and underwent Procedure(s): LEFT ARTHROSCOPY, CHONDROPLASTY.  Patient tolerated the procedure well and was later transferred to the recovery room and then to  the orthopaedic floor for postoperative care. They were given PO and IV analgesics for pain control following their surgery. They were given 24 hours of postoperative antibiotics of  Anti-infectives (From admission, onward)   Start     Dose/Rate Route Frequency Ordered Stop   12/05/18 1800  ceFAZolin (ANCEF) IVPB 2g/100 mL premix     2 g 200 mL/hr over 30 Minutes Intravenous Every 6 hours 12/05/18 1552 12/06/18 0028   12/05/18 0915  ceFAZolin (ANCEF) IVPB 2g/100 mL premix     2 g 200 mL/hr over 30 Minutes Intravenous On call to O.R. 12/05/18 0932 12/05/18 1153     and started on DVT prophylaxis in the form of Aspirin.   PT and OT were ordered for total joint protocol. Discharge planning consulted to help with postop disposition and equipment needs.  Patient had a good night on the evening of surgery. They started to get up OOB with therapy on POD #0. Pt was seen during rounds and was ready to go home pending progress with therapy. Hemovac drain was pulled without difficulty. He worked with therapy on POD #1 and was meeting his goals. Pt was discharged to home later that day in stable condition.  Diet: Regular diet Activity: WBAT Follow-up: in 2 weeks Disposition: Home with OPPT at [facility] Discharged Condition: stable   Discharge Instructions    Call MD / Call 911   Complete by: As directed    If you experience chest pain or shortness of breath, CALL 911 and be transported to the hospital emergency room.  If you develope a fever above 101 F, pus (white drainage) or increased drainage or redness at the wound, or calf pain, call your surgeon's office.   Change  dressing   Complete by: As directed    Change dressing on Wednesday, then change the dressing daily with sterile 4 x 4 inch gauze dressing and apply TED hose.   Constipation Prevention   Complete by: As directed    Drink plenty of fluids.  Prune juice may be helpful.  You may use a stool softener, such as Colace (over the counter)  100 mg twice a day.  Use MiraLax (over the counter) for constipation as needed.   Diet - low sodium heart healthy   Complete by: As directed    Discharge instructions   Complete by: As directed    Dr. Gaynelle Arabian Total Joint Specialist [facility] [address], [place], [place] [address] [phone]  TOTAL KNEE REPLACEMENT POSTOPERATIVE DIRECTIONS  Knee Rehabilitation, Guidelines Following Surgery  Results after knee surgery are often greatly improved when you follow the exercise, range of motion and muscle strengthening exercises prescribed by your doctor. Safety measures are also important to protect the knee from further injury. Any time any of these exercises cause you to have increased pain or swelling in your knee joint, decrease the amount until you are comfortable again and slowly increase them. If you have problems or questions, call your caregiver or physical therapist for advice.   HOME CARE INSTRUCTIONS  Remove items at home which could result in a fall. This includes throw rugs or furniture in walking pathways.  ICE to the affected knee every three hours for 30 minutes at a time and then as needed for pain and swelling.  Continue to use ice on the knee for pain and swelling from surgery. You may notice swelling that will progress down to the foot and ankle.  This is normal after surgery.  Elevate the leg when you are not up walking on it.   Continue to use the breathing machine which will help keep your temperature down.  It is common for your temperature to cycle up and down following surgery, especially at night when you are not up moving around and exerting yourself.  The breathing machine keeps your lungs expanded and your temperature down. Do not place pillow under knee, focus on keeping the knee straight while resting   DIET You may resume your previous home diet once your are discharged from the hospital.  DRESSING / WOUND CARE / SHOWERING You may  change your dressing 3-5 days after surgery.  Then change the dressing every day with sterile gauze.  Please use good hand washing techniques before changing the dressing.  Do not use any lotions or creams on the incision until instructed by your surgeon. You may start showering once you are discharged home but do not submerge the incision under water. Just pat the incision dry and apply a dry gauze dressing on daily. Change the surgical dressing daily and reapply a dry dressing each time.  ACTIVITY Walk with your walker as instructed. Use walker as long as suggested by your caregivers. Avoid periods of inactivity such as sitting longer than an hour when not asleep. This helps prevent blood clots.  You may resume a sexual relationship in one month or when given the OK by your doctor.  You may return to work once you are cleared by your doctor.  Do not drive a car for 6 weeks or until released by you surgeon.  Do not drive while taking narcotics.  WEIGHT BEARING Weight bearing as tolerated with assist device (walker, cane, etc) as directed,  use it as long as suggested by your surgeon or therapist, typically at least 4-6 weeks.  POSTOPERATIVE CONSTIPATION PROTOCOL Constipation - defined medically as fewer than three stools per week and severe constipation as less than one stool per week.  One of the most common issues patients have following surgery is constipation.  Even if you have a regular bowel pattern at home, your normal regimen is likely to be disrupted due to multiple reasons following surgery.  Combination of anesthesia, postoperative narcotics, change in appetite and fluid intake all can affect your bowels.  In order to avoid complications following surgery, here are some recommendations in order to help you during your recovery period.  Colace (docusate) - Pick up an over-the-counter form of Colace or another stool softener and take twice a day as long as you are requiring  postoperative pain medications.  Take with a full glass of water daily.  If you experience loose stools or diarrhea, hold the colace until you stool forms back up.  If your symptoms do not get better within 1 week or if they get worse, check with your doctor.  Dulcolax (bisacodyl) - Pick up over-the-counter and take as directed by the product packaging as needed to assist with the movement of your bowels.  Take with a full glass of water.  Use this product as needed if not relieved by Colace only.   MiraLax (polyethylene glycol) - Pick up over-the-counter to have on hand.  MiraLax is a solution that will increase the amount of water in your bowels to assist with bowel movements.  Take as directed and can mix with a glass of water, juice, soda, coffee, or tea.  Take if you go more than two days without a movement. Do not use MiraLax more than once per day. Call your doctor if you are still constipated or irregular after using this medication for 7 days in a row.  If you continue to have problems with postoperative constipation, please contact the office for further assistance and recommendations.  If you experience "the worst abdominal pain ever" or develop nausea or vomiting, please contact the office immediatly for further recommendations for treatment.  ITCHING  If you experience itching with your medications, try taking only a single pain pill, or even half a pain pill at a time.  You can also use Benadryl over the counter for itching or also to help with sleep.   TED HOSE STOCKINGS Wear the elastic stockings on both legs for three weeks following surgery during the day but you may remove then at night for sleeping.  MEDICATIONS See your medication summary on the "After Visit Summary" that the nursing staff will review with you prior to discharge.  You may have some home medications which will be placed on hold until you complete the course of blood thinner medication.  It is important for you to  complete the blood thinner medication as prescribed by your surgeon.  Continue your approved medications as instructed at time of discharge.  PRECAUTIONS If you experience chest pain or shortness of breath - call 911 immediately for transfer to the hospital emergency department.  If you develop a fever greater that 101 F, purulent drainage from wound, increased redness or drainage from wound, foul odor from the wound/dressing, or calf pain - CONTACT YOUR SURGEON.  FOLLOW-UP APPOINTMENTS Make sure you keep all of your appointments after your operation with your surgeon and caregivers. You should call the office at the above phone number and make an appointment for approximately two weeks after the date of your surgery or on the date instructed by your surgeon outlined in the "After Visit Summary".   RANGE OF MOTION AND STRENGTHENING EXERCISES  Rehabilitation of the knee is important following a knee injury or an operation. After just a few days of immobilization, the muscles of the thigh which control the knee become weakened and shrink (atrophy). Knee exercises are designed to build up the tone and strength of the thigh muscles and to improve knee motion. Often times heat used for twenty to thirty minutes before working out will loosen up your tissues and help with improving the range of motion but do not use heat for the first two weeks following surgery. These exercises can be done on a training (exercise) mat, on the floor, on a table or on a bed. Use what ever works the best and is most comfortable for you Knee exercises include:  Leg Lifts - While your knee is still immobilized in a splint or cast, you can do straight leg raises. Lift the leg to 60 degrees, hold for 3 sec, and slowly lower the leg. Repeat 10-20 times 2-3 times daily. Perform this exercise against resistance later as your knee gets better.  Quad and Hamstring Sets - Tighten up the  muscle on the front of the thigh (Quad) and hold for 5-10 sec. Repeat this 10-20 times hourly. Hamstring sets are done by pushing the foot backward against an object and holding for 5-10 sec. Repeat as with quad sets.  Leg Slides: Lying on your back, slowly slide your foot toward your buttocks, bending your knee up off the floor (only go as far as is comfortable). Then slowly slide your foot back down until your leg is flat on the floor again. Angel Wings: Lying on your back spread your legs to the side as far apart as you can without causing discomfort.  A rehabilitation program following serious knee injuries can speed recovery and prevent re-injury in the future due to weakened muscles. Contact your doctor or a physical therapist for more information on knee rehabilitation.   IF YOU ARE TRANSFERRED TO A SKILLED REHAB FACILITY If the patient is transferred to a skilled rehab facility following release from the hospital, a list of the current medications will be sent to the facility for the patient to continue.  When discharged from the skilled rehab facility, please have the facility set up the patient's [place] prior to being released. Also, the skilled facility will be responsible for providing the patient with their medications at time of release from the facility to include their pain medication, the muscle relaxants, and their blood thinner medication. If the patient is still at the rehab facility at time of the two week follow up appointment, the skilled rehab facility will also need to assist the patient in arranging follow up appointment in our office and any transportation needs.  MAKE SURE YOU:  Understand these instructions.  Get help right away if you are not doing well or get worse.    Pick up stool softner and laxative for home use following surgery while on pain medications. Do not submerge incision under water. Please use good hand washing techniques while  changing dressing each day. May shower starting three days after surgery.  Please use a clean towel to pat the incision dry following showers. Continue to use ice for pain and swelling after surgery. Do not use any lotions or creams on the incision until instructed by your surgeon.   Do not put a pillow under the knee. Place it under the heel.   Complete by: As directed    Driving restrictions   Complete by: As directed    No driving for two weeks   TED hose   Complete by: As directed    Use stockings (TED hose) for three weeks on both leg(s).  You may remove them at night for sleeping.   Weight bearing as tolerated   Complete by: As directed      Allergies as of 12/06/2018      Reactions   Sesame Oil Itching, Swelling   Tobramycin Itching, Swelling   Used in an eye oint-reaction      Medication List    STOP taking these medications   diclofenac sodium 1 % Gel Commonly known as: VOLTAREN   OVER THE COUNTER MEDICATION   OXYCODONE-ACETAMINOPHEN PO     TAKE these medications   aspirin 325 MG EC tablet Take 1 tablet (325 mg total) by mouth 2 (two) times daily for 20 days. Then take one 81 mg aspirin once a day for three weeks. Then discontinue aspirin.   gabapentin 300 MG capsule Commonly known as: NEURONTIN Take 1 capsule (300 mg total) by mouth 3 (three) times daily. Take a 300 mg capsule three times a day for two weeks following surgery.Then take a 300 mg capsule two times a day for two weeks. Then take a 300 mg capsule once a day for two weeks. Then discontinue.   methocarbamol 500 MG tablet Commonly known as: ROBAXIN Take 1 tablet (500 mg total) by mouth every 6 (six) hours as needed for muscle spasms.   oxyCODONE 5 MG immediate release tablet Commonly known as: Oxy IR/ROXICODONE Take 1-2 tablets (5-10 mg total) by mouth every 6 (six) hours as needed for severe pain.   ProAir HFA 108 (90 Base) MCG/ACT inhaler Generic drug: albuterol Inhale 2 puffs into the lungs  every 6 (six) hours as needed for wheezing or shortness of breath.   traMADol 50 MG tablet Commonly known as: ULTRAM Take 1-2 tablets (50-100 mg total) by mouth every 6 (six) hours as needed for moderate pain.   Vitamin B-12 5000 MCG Tbdp Take 5,000 mcg by mouth daily.   vitamin C 1000 MG tablet Take 1,000 mg by mouth 2 (two) times daily.            Discharge Care Instructions  (From admission, onward)         Start     Ordered   12/06/18 0000  Weight bearing as tolerated     12/06/18 0733   12/06/18 0000  Change dressing    Comments: Change dressing on Wednesday, then change the dressing daily with sterile 4 x 4 inch gauze dressing and apply TED hose.   12/06/18 7681         Follow-up Information    Gaynelle Arabian, MD. Schedule an appointment as soon as possible for a visit on 12/20/2018.   Specialty: Orthopedic Surgery Contact information: [address] [place] [address] [phone]           Signed: Theresa Duty, PA-C Orthopedic Surgery 12/07/2018, 10:25 AM

## 2018-12-09 DIAGNOSIS — M25561 Pain in right knee: Secondary | ICD-10-CM | POA: Diagnosis not present

## 2018-12-09 DIAGNOSIS — Z96651 Presence of right artificial knee joint: Secondary | ICD-10-CM | POA: Diagnosis not present

## 2018-12-09 DIAGNOSIS — M25661 Stiffness of right knee, not elsewhere classified: Secondary | ICD-10-CM | POA: Diagnosis not present

## 2018-12-09 DIAGNOSIS — M6281 Muscle weakness (generalized): Secondary | ICD-10-CM | POA: Diagnosis not present

## 2018-12-13 ENCOUNTER — Ambulatory Visit: Payer: Self-pay | Admitting: Internal Medicine

## 2018-12-13 DIAGNOSIS — M6281 Muscle weakness (generalized): Secondary | ICD-10-CM | POA: Diagnosis not present

## 2018-12-13 DIAGNOSIS — Z96651 Presence of right artificial knee joint: Secondary | ICD-10-CM | POA: Diagnosis not present

## 2018-12-13 DIAGNOSIS — M25661 Stiffness of right knee, not elsewhere classified: Secondary | ICD-10-CM | POA: Diagnosis not present

## 2018-12-13 DIAGNOSIS — M25561 Pain in right knee: Secondary | ICD-10-CM | POA: Diagnosis not present

## 2018-12-15 DIAGNOSIS — M25661 Stiffness of right knee, not elsewhere classified: Secondary | ICD-10-CM | POA: Diagnosis not present

## 2018-12-15 DIAGNOSIS — M6281 Muscle weakness (generalized): Secondary | ICD-10-CM | POA: Diagnosis not present

## 2018-12-15 DIAGNOSIS — Z96651 Presence of right artificial knee joint: Secondary | ICD-10-CM | POA: Diagnosis not present

## 2018-12-15 DIAGNOSIS — M25561 Pain in right knee: Secondary | ICD-10-CM | POA: Diagnosis not present

## 2018-12-20 DIAGNOSIS — M25561 Pain in right knee: Secondary | ICD-10-CM | POA: Diagnosis not present

## 2018-12-20 DIAGNOSIS — M25661 Stiffness of right knee, not elsewhere classified: Secondary | ICD-10-CM | POA: Diagnosis not present

## 2018-12-20 DIAGNOSIS — M6281 Muscle weakness (generalized): Secondary | ICD-10-CM | POA: Diagnosis not present

## 2018-12-20 DIAGNOSIS — Z96651 Presence of right artificial knee joint: Secondary | ICD-10-CM | POA: Diagnosis not present

## 2018-12-22 DIAGNOSIS — M25661 Stiffness of right knee, not elsewhere classified: Secondary | ICD-10-CM | POA: Diagnosis not present

## 2018-12-22 DIAGNOSIS — M25561 Pain in right knee: Secondary | ICD-10-CM | POA: Diagnosis not present

## 2018-12-22 DIAGNOSIS — M6281 Muscle weakness (generalized): Secondary | ICD-10-CM | POA: Diagnosis not present

## 2018-12-22 DIAGNOSIS — Z96651 Presence of right artificial knee joint: Secondary | ICD-10-CM | POA: Diagnosis not present

## 2018-12-27 DIAGNOSIS — Z96651 Presence of right artificial knee joint: Secondary | ICD-10-CM | POA: Diagnosis not present

## 2018-12-27 DIAGNOSIS — M25661 Stiffness of right knee, not elsewhere classified: Secondary | ICD-10-CM | POA: Diagnosis not present

## 2018-12-27 DIAGNOSIS — M6281 Muscle weakness (generalized): Secondary | ICD-10-CM | POA: Diagnosis not present

## 2018-12-27 DIAGNOSIS — M25561 Pain in right knee: Secondary | ICD-10-CM | POA: Diagnosis not present

## 2018-12-29 DIAGNOSIS — M6281 Muscle weakness (generalized): Secondary | ICD-10-CM | POA: Diagnosis not present

## 2018-12-29 DIAGNOSIS — M25561 Pain in right knee: Secondary | ICD-10-CM | POA: Diagnosis not present

## 2018-12-29 DIAGNOSIS — Z96651 Presence of right artificial knee joint: Secondary | ICD-10-CM | POA: Diagnosis not present

## 2018-12-29 DIAGNOSIS — M25661 Stiffness of right knee, not elsewhere classified: Secondary | ICD-10-CM | POA: Diagnosis not present

## 2019-01-03 DIAGNOSIS — M6281 Muscle weakness (generalized): Secondary | ICD-10-CM | POA: Diagnosis not present

## 2019-01-03 DIAGNOSIS — M25561 Pain in right knee: Secondary | ICD-10-CM | POA: Diagnosis not present

## 2019-01-03 DIAGNOSIS — Z96651 Presence of right artificial knee joint: Secondary | ICD-10-CM | POA: Diagnosis not present

## 2019-01-03 DIAGNOSIS — M25661 Stiffness of right knee, not elsewhere classified: Secondary | ICD-10-CM | POA: Diagnosis not present

## 2019-01-05 DIAGNOSIS — M25661 Stiffness of right knee, not elsewhere classified: Secondary | ICD-10-CM | POA: Diagnosis not present

## 2019-01-05 DIAGNOSIS — M25561 Pain in right knee: Secondary | ICD-10-CM | POA: Diagnosis not present

## 2019-01-05 DIAGNOSIS — Z96651 Presence of right artificial knee joint: Secondary | ICD-10-CM | POA: Diagnosis not present

## 2019-01-05 DIAGNOSIS — M6281 Muscle weakness (generalized): Secondary | ICD-10-CM | POA: Diagnosis not present

## 2019-01-10 DIAGNOSIS — M25661 Stiffness of right knee, not elsewhere classified: Secondary | ICD-10-CM | POA: Diagnosis not present

## 2019-01-10 DIAGNOSIS — M25561 Pain in right knee: Secondary | ICD-10-CM | POA: Diagnosis not present

## 2019-01-10 DIAGNOSIS — Z471 Aftercare following joint replacement surgery: Secondary | ICD-10-CM | POA: Diagnosis not present

## 2019-01-10 DIAGNOSIS — M6281 Muscle weakness (generalized): Secondary | ICD-10-CM | POA: Diagnosis not present

## 2019-01-10 DIAGNOSIS — Z96651 Presence of right artificial knee joint: Secondary | ICD-10-CM | POA: Diagnosis not present

## 2019-03-08 DIAGNOSIS — H53001 Unspecified amblyopia, right eye: Secondary | ICD-10-CM | POA: Diagnosis not present

## 2019-03-08 DIAGNOSIS — H524 Presbyopia: Secondary | ICD-10-CM | POA: Diagnosis not present

## 2019-03-08 DIAGNOSIS — H52223 Regular astigmatism, bilateral: Secondary | ICD-10-CM | POA: Diagnosis not present

## 2019-03-08 DIAGNOSIS — H2513 Age-related nuclear cataract, bilateral: Secondary | ICD-10-CM | POA: Diagnosis not present

## 2019-03-08 DIAGNOSIS — H5203 Hypermetropia, bilateral: Secondary | ICD-10-CM | POA: Diagnosis not present

## 2019-03-17 ENCOUNTER — Telehealth: Payer: Self-pay | Admitting: Internal Medicine

## 2019-03-17 NOTE — Telephone Encounter (Signed)
Pt is calling in wanting to do a transfer from Dr. Jerilee Hoh to Dr. Dimas Chyle at [facility] is it okay to do a transfer?  Pt state that he prefers to have a male doctor.

## 2019-03-17 NOTE — Telephone Encounter (Signed)
Patient is aware 

## 2019-03-17 NOTE — Telephone Encounter (Signed)
Fine with me

## 2019-03-17 NOTE — Telephone Encounter (Signed)
Arial with me but please let patient know it may be several weeks/months for a transition of care visit due to taking part of Dr Alcario Drought panel.   [name]. Jerline Pain, MD 03/17/2019 11:57 AM

## 2019-03-20 ENCOUNTER — Other Ambulatory Visit: Payer: Self-pay

## 2019-03-20 ENCOUNTER — Encounter: Payer: Self-pay | Admitting: Family Medicine

## 2019-03-20 ENCOUNTER — Ambulatory Visit (INDEPENDENT_AMBULATORY_CARE_PROVIDER_SITE_OTHER): Payer: PPO | Admitting: Family Medicine

## 2019-03-20 DIAGNOSIS — L301 Dyshidrosis [pompholyx]: Secondary | ICD-10-CM

## 2019-03-20 MED ORDER — PREDNISONE 10 MG PO TABS: ORAL_TABLET | ORAL | 0 refills | Status: DC

## 2019-03-20 NOTE — Progress Notes (Signed)
Phone [phone] In person visit   Subjective:   [name] is a 68 y.o. year old very pleasant male patient who presents for/with See problem oriented charting Chief Complaint  Patient presents with  . Skin Problem    cracking of skin in hands     ROS- Review of Systems  Constitutional: Negative.   HENT: Negative.   Eyes: Negative.   Respiratory: Negative.   Cardiovascular: Negative.   Gastrointestinal: Negative.   Genitourinary: Negative.   Musculoskeletal: Negative.   Skin:       Skin on hands cracking   Neurological: Negative.   Endo/Heme/Allergies: Negative.   Psychiatric/Behavioral: Negative.    This visit occurred during the SARS-CoV-2 public health emergency.  Safety protocols were in place, including screening questions prior to the visit, additional usage of staff PPE, and extensive cleaning of exam room while observing appropriate contact time as indicated for disinfecting solutions.   Past Medical History-  Patient Active Problem List   Diagnosis Date Noted  . OA (osteoarthritis) of knee 12/05/2018  . Osteoarthritis of right knee 12/05/2018  . Moderate asthma without complication [id number]  . Eczema of lower leg 08/16/2017  . Ganglion cyst of dorsum of left wrist 08/16/2017  . Routine general medical examination at a health care facility 08/23/2014  . Pure hypercholesterolemia 04/05/2013  . 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 Kidney stone on right side 02/29/2012  . NOISE-INDUCED HEARING LOSS 12/17/2008  . Dyshidrotic eczema 12/08/2007  . NEPHROLITHIASIS, HX OF 11/10/2007  . Allergic rhinitis 06/24/2007    Medications- reviewed and updated Current Outpatient Medications  Medication Sig Dispense Refill  . albuterol (PROAIR HFA) 108 (90 Base) MCG/ACT inhaler Inhale 2 puffs into the lungs every 6 (six) hours as needed for wheezing or shortness of breath.     . Ascorbic Acid (VITAMIN C) 1000 MG tablet Take 1,000 mg by mouth 2 (two)  times daily.    . Cyanocobalamin (VITAMIN B-12) 5000 MCG TBDP Take 5,000 mcg by mouth daily.    . predniSONE (DELTASONE) 10 MG tablet Use as instructed but for first 2 weeks- Take 2 tabs for 3 days, then 1 tab for 3 days, then 1/2 tab for 3 days, then 1/2 tab every other day for 6 days. 40 tablet 0   Current Facility-Administered Medications  Medication Dose Route Frequency Provider Last Rate Last Dose  . 0.9 %  sodium chloride infusion  500 mL Intravenous Continuous Ladene Artist, MD         Objective:  BP 140/66   Pulse 92   Temp 98.4 F (36.9 C) (Temporal)   Ht 5\' 11"  (1.803 m)   Wt 240 lb (108.9 kg)   SpO2 95%   BMI 33.47 kg/m  Gen: NAD, resting comfortably CV: RRR no murmurs rubs or gallops Lungs: CTAB no crackles, wheeze, rhonchi Ext: no edema Skin: warm, dry, has some thickened skin with scaling on bilateral hands-far worse on the left hand.  Some cracks in the left hand noted in these areas.     Assessment and Plan  # Skin cracking on both hands Per patient -Eczema per history S:has had in the past. In the past was given prednisone that helped with it.  Symptoms worsen in the winter.  Over the last few days has noted thickening of the skin as well as some cracks in the skin in these areas on his hands only-no other areas currently involved  Patient states he has severe eczema-appears  to be dyshidrotic eczema.  Reviewing notes does look like he was given 10 mg tablets of prednisone up to #120 at a time by prior PCP Dr. Sherren Mocha.  Patient states he uses these on an as-needed basis after flare is calm down.  Patient states he has tried multiple creams/steroids in the past including under gloves and never has had improvement with that A/P: Appears to be dyshidrotic eczema-has failed topical treatment in the past.  I did agree to fill prednisone 10 mg tablets and wrote a taper for him with some remaining tablets to use with flares through the winter-for ongoing management I  encouraged PCP follow-up.  Recommended follow up: Appears to be transferring to Dr. Jerline Pain in January Future Appointments  Date Time Provider [place]  05/16/2019  1:20 PM Vivi Barrack, MD [facility]   Lab/Order associations:   ICD-10-CM   1. Dyshidrotic eczema  L30.1     Meds ordered this encounter  Medications  . predniSONE (DELTASONE) 10 MG tablet    Sig: Use as instructed but for first 2 weeks- Take 2 tabs for 3 days, then 1 tab for 3 days, then 1/2 tab for 3 days, then 1/2 tab every other day for 6 days.    Dispense:  40 tablet    Refill:  0    Return precautions advised.  Garret Reddish, MD

## 2019-03-20 NOTE — Patient Instructions (Addendum)
Health Maintenance Due  Topic Date Due  . Hepatitis C Screening will have at PCP  28-Jul-1950   I sent in prednisone. I gave you some extra pills to use as needed for flare ups during the winter- Follow up with PCP if this fails to improve your symptoms.

## 2019-03-20 NOTE — Telephone Encounter (Signed)
Pt was called and given the number to [place] and he is aware that he has to do a [facility] appointment with Dr. Jerline Pain before being accepted as his pt.

## 2019-03-20 NOTE — Assessment & Plan Note (Signed)
#   Skin cracking on both hands Per patient -Eczema per history e S:has had in the past. In the past was given prednisone that helped with it.  Symptoms worsen in the winter.  Over the last few days has noted thickening of the skin as well as some cracks in the skin in these areas on his hands only-no other areas currently involved  Patient states he has severe eczema-appears to be dyshidrotic eczema.  Reviewing notes does look like he was given 10 mg tablets of prednisone up to #120 at a time by prior PCP Dr. Sherren Mocha.  Patient states he uses these on an as-needed basis after flare is calm down.  Patient states he has tried multiple creams/steroids in the past including under gloves and never has had improvement with that A/P: Appears to be dyshidrotic eczema-has failed topical treatment in the past.  I did agree to fill prednisone 10 mg tablets and wrote a taper for him with some remaining tablets to use with flares through the winter-for ongoing management I encouraged PCP follow-up.

## 2019-04-17 ENCOUNTER — Other Ambulatory Visit: Payer: Self-pay | Admitting: Family Medicine

## 2019-04-19 NOTE — Telephone Encounter (Signed)
Patient called to check the status of his medication request.  The pharmacy still does not have the prescription.  Please advise.

## 2019-04-19 NOTE — Telephone Encounter (Signed)
Forwarding.  Dr. Jerline Pain has not seen this patient.  Still under Dr. Isaac Bliss care until [facility] [place] appt.

## 2019-04-19 NOTE — Telephone Encounter (Signed)
See note, patient has transfer of care appointment in Jan.

## 2019-04-19 NOTE — Telephone Encounter (Signed)
Message routed to PCP CMA  

## 2019-04-20 ENCOUNTER — Other Ambulatory Visit: Payer: Self-pay

## 2019-04-20 MED ORDER — PREDNISONE 10 MG PO TABS: ORAL_TABLET | ORAL | 0 refills | Status: DC

## 2019-04-20 NOTE — Progress Notes (Signed)
May refill-next refill should come from Dr. Jerline Pain who he is establishing with next month

## 2019-05-15 ENCOUNTER — Other Ambulatory Visit: Payer: Self-pay

## 2019-05-16 ENCOUNTER — Encounter: Payer: Self-pay | Admitting: Family Medicine

## 2019-05-16 ENCOUNTER — Ambulatory Visit (INDEPENDENT_AMBULATORY_CARE_PROVIDER_SITE_OTHER): Payer: PPO

## 2019-05-16 ENCOUNTER — Ambulatory Visit (INDEPENDENT_AMBULATORY_CARE_PROVIDER_SITE_OTHER): Payer: PPO | Admitting: Family Medicine

## 2019-05-16 VITALS — BP 130/72 | HR 97 | Temp 98.7°F | Ht 71.0 in | Wt 242.0 lb

## 2019-05-16 VITALS — BP 130/72 | Temp 98.7°F | Ht 71.0 in | Wt 242.1 lb

## 2019-05-16 DIAGNOSIS — M67432 Ganglion, left wrist: Secondary | ICD-10-CM

## 2019-05-16 DIAGNOSIS — J45909 Unspecified asthma, uncomplicated: Secondary | ICD-10-CM

## 2019-05-16 DIAGNOSIS — Z0000 Encounter for general adult medical examination without abnormal findings: Secondary | ICD-10-CM | POA: Diagnosis not present

## 2019-05-16 DIAGNOSIS — M1710 Unilateral primary osteoarthritis, unspecified knee: Secondary | ICD-10-CM | POA: Diagnosis not present

## 2019-05-16 DIAGNOSIS — M179 Osteoarthritis of knee, unspecified: Secondary | ICD-10-CM

## 2019-05-16 DIAGNOSIS — L301 Dyshidrosis [pompholyx]: Secondary | ICD-10-CM

## 2019-05-16 MED ORDER — PREDNISONE 10 MG PO TABS: 10.0000 mg | ORAL_TABLET | Freq: Every day | ORAL | 3 refills | Status: DC

## 2019-05-16 MED ORDER — METHYLPREDNISOLONE ACETATE 40 MG/ML IJ SUSP
40.0000 mg | Freq: Once | INTRAMUSCULAR | Status: AC
  Administered 2019-05-16: 15:00:00 40 mg via INTRAMUSCULAR

## 2019-05-16 MED ORDER — PREDNISONE 10 MG PO TABS: ORAL_TABLET | ORAL | 3 refills | Status: DC

## 2019-05-16 NOTE — Patient Instructions (Signed)
It was very nice to see you today!  We injected your cyst today. Let me know if it does not go away.  Come back in a year for your next check up, or sooner if needed.   Take care, Dr Jerline Pain  Please try these tips to maintain a healthy lifestyle:   Eat at least 3 REAL meals and 1-2 snacks per day.  Aim for no more than 5 hours between eating.  If you eat breakfast, please do so within one hour of getting up.    Each meal should contain half fruits/vegetables, one quarter protein, and one quarter carbs (no bigger than a computer mouse)   Cut down on sweet beverages. This includes juice, soda, and sweet tea.     Drink at least 1 glass of water with each meal and aim for at least 8 glasses per day   Exercise at least 150 minutes every week.

## 2019-05-16 NOTE — Assessment & Plan Note (Signed)
Stable.  Continue management per orthopedics. 

## 2019-05-16 NOTE — Assessment & Plan Note (Signed)
Stable. Continue albuterol as needed.  

## 2019-05-16 NOTE — Assessment & Plan Note (Signed)
Stable.  Prednisone refilled.

## 2019-05-16 NOTE — Addendum Note (Signed)
Addended by: Loralyn Freshwater on: 05/16/2019 02:38 PM   Modules accepted: Orders

## 2019-05-16 NOTE — Progress Notes (Signed)
Subjective:   [name] is a 69 y.o. male who presents for Medicare Annual/Subsequent preventive examination.  Review of Systems:   Cardiac Risk Factors include: advanced age (>8men, >19 women);male gender    Objective:    Vitals: BP 130/72   Temp 98.7 F (37.1 C) (Temporal)   Ht 5\' 11"  (1.803 m)   Wt 242 lb 1 oz (109.8 kg)   BMI 33.76 kg/m   Body mass index is 33.76 kg/m.  Advanced Directives 05/16/2019 12/05/2018 11/30/2018 09/01/2017 09/24/2016 09/03/2016 08/25/2016  Does Patient Have a Medical Advance Directive? Yes No No Yes Yes Yes Yes  Type of Paramedic of [place];Living will - - - [place];Living will Healthcare Power of [place];Living will  Does patient want to make changes to medical advance directive? No - Patient declined - - - - - -  Copy of [place] in Chart? No - copy requested - - - No - copy requested - No - copy requested  Would patient like information on creating a medical advance directive? - No - Patient declined No - Patient declined - - - -    Tobacco Social History   Tobacco Use  Smoking Status Never Smoker  Smokeless Tobacco Never Used     Counseling given: Not Answered   Clinical Intake:  Pre-visit preparation completed: Yes  Pain : No/denies pain  Diabetes: No  How often do you need to have someone help you when you read instructions, pamphlets, or other written materials from your doctor or pharmacy?: 1 - Never  Interpreter Needed?: No  Information entered by :: Denman George LPN  Past Medical History:  Diagnosis Date  . Anxiety   . Arthritis   . Asthma   . At risk for sleep apnea    STOP-BANG = 4  SENT TO PCP 05-24-2013  . Eczema   . Edema of foot   . Finger wound, simple, open    left middle index finger - stitches and dressing occured on 09/22/2016- followed by Dr Daws [facility]   . H/O hiatal hernia   . History of  kidney stones   . HOH (hard of hearing)   . Hyperlipidemia   . Influenza A   . Influenza B    symptoms started 05-16-2013/  dx 05-17-2013 by pcp  . Left ankle injury    twisted left ankle   . Left ureteral calculus   . Pneumonia    hx of   . Rash    right leg- calf -   . Wears dentures    bottom   Past Surgical History:  Procedure Laterality Date  . ANTERIOR CERVICAL DECOMP/DISCECTOMY FUSION  01-27-2007   C6  --- C7  . COLONOSCOPY    . CYSTOSCOPY WITH URETEROSCOPY Left 05/26/2013   Procedure: CYSTOSCOPY WITH URETEROSCOPY, URETHERAL DILATATION,LEFT RETROGRADE, LASER LITHOTRIPSY, STENT PLACMENT, LEFT URETEROSCOPY;  Surgeon: Ailene Rud, MD;  Location: [facility];  Service: Urology;  Laterality: Left;  . FINGER FRACTURE SURGERY  AS CHILD  . HOLMIUM LASER APPLICATION Left [id number]   Procedure: HOLMIUM LASER APPLICATION;  Surgeon: Ailene Rud, MD;  Location: [facility];  Service: Urology;  Laterality: Left;  . LIPOMA EXCISION  05/17/2012   Procedure: EXCISION LIPOMA;  Surgeon: Gayland Curry, MD,FACS;  Location: [place];  Service: General;  Laterality: Left;  incisional biopsy of left shoulder soft  tissue mass  . NASAL FRACTURE SURGERY  AS CHILD  . ORIF ULNAR FRACTURE Right AS CHILD  . ROTATOR CUFF REPAIR     right shoulder [id number] by [place]  . TOTAL KNEE ARTHROPLASTY Right 12/05/2018   Procedure: LEFT ARTHROSCOPY, CHONDROPLASTY;  Surgeon: Gaynelle Arabian, MD;  Location: [facility];  Service: Orthopedics;  Laterality: Right;  47min   Family History  Problem Relation Age of Onset  . Arthritis Other   . Hypertension Other   . Osteoporosis Other   . Heart disease Other   . Lung disease Other   . Prostate cancer Father   . Heart disease Sister   . Colon cancer Neg Hx   . Colon polyps Neg Hx   . Esophageal cancer Neg Hx   . Kidney disease Neg Hx   . Rectal cancer Neg Hx   . Stomach  cancer Neg Hx    Social History   Socioeconomic History  . Marital status: Married    Spouse name: Not on file  . Number of children: 2  . Years of education: Not on file  . Highest education level: Not on file  Occupational History  . Occupation: Hospital doctor  Tobacco Use  . Smoking status: Never Smoker  . Smokeless tobacco: Never Used  Substance and Sexual Activity  . Alcohol use: Yes    Alcohol/week: 7.0 standard drinks    Types: 7 Cans of beer per week    Comment: beer- 2 beers day sometimes   . Drug use: No  . Sexual activity: Not on file  Other Topics Concern  . Not on file  Social History Narrative  . Not on file   Social Determinants of Health   Financial Resource Strain:   . Difficulty of Paying Living Expenses: Not on file  Food Insecurity:   . Worried About Charity fundraiser in the Last Year: Not on file  . Ran Out of Food in the Last Year: Not on file  Transportation Needs:   . Lack of Transportation (Medical): Not on file  . Lack of Transportation (Non-Medical): Not on file  Physical Activity:   . Days of Exercise per Week: Not on file  . Minutes of Exercise per Session: Not on file  Stress:   . Feeling of Stress : Not on file  Social Connections:   . Frequency of Communication with Friends and Family: Not on file  . Frequency of Social Gatherings with Friends and Family: Not on file  . Attends Religious Services: Not on file  . Active Member of Clubs or Organizations: Not on file  . Attends Archivist Meetings: Not on file  . Marital Status: Not on file    Outpatient Encounter Medications as of 05/16/2019  Medication Sig  . albuterol (PROAIR HFA) 108 (90 Base) MCG/ACT inhaler Inhale 2 puffs into the lungs every 6 (six) hours as needed for wheezing or shortness of breath.   . Ascorbic Acid (VITAMIN C) 1000 MG tablet Take 1,000 mg by mouth 2 (two) times daily.  . Cyanocobalamin (VITAMIN B-12) 5000 MCG TBDP Take 5,000 mcg by  mouth daily.  . [DISCONTINUED] 0.9 %  sodium chloride infusion    No facility-administered encounter medications on file as of 05/16/2019.    Activities of Daily Living In your present state of health, do you have any difficulty performing the following activities: 05/16/2019 12/05/2018  Hearing? N Y  [place]? N N  Difficulty concentrating or making decisions? N N  Walking or climbing stairs? N Y  Dressing or bathing? N N  Doing errands, shopping? N N  Preparing Food and eating ? N -  Using the Toilet? N -  In the past six months, have you accidently leaked urine? N -  Do you have problems with loss of bowel control? N -  Managing your Medications? N -  Managing your Finances? N -  Housekeeping or managing your Housekeeping? N -  Some recent data might be hidden    Patient Care Team: Vivi Barrack, MD as PCP - General (Family Medicine) Gaynelle Arabian, MD as Consulting Physician (Orthopedic Surgery) Teodoro Spray, [place] as Consulting Physician (Optometry)   Assessment:   This is a routine wellness examination for [employer].  Exercise Activities and Dietary recommendations Current Exercise Habits: The patient does not participate in regular exercise at present  Goals    . Weight (lb) < 225 lb (102.1 kg)     Cutting back on portions  Change happens slowly        Fall Risk Fall Risk  05/16/2019 03/20/2019 09/01/2017 03/10/2016 06/07/2014  Falls in the past year? 0 0 No No No  Number falls in past yr: - 0 - - -  Injury with Fall? - 0 - - -   Is the patient's home free of loose throw rugs in walkways, pet beds, electrical cords, etc?   yes      Grab bars in the bathroom? yes      Handrails on the stairs?   yes      Adequate lighting?   yes  Timed Get Up and Go Performed: completed and within normal timeframe; no gait abnormalities noted   Depression Screen PHQ 2/9 Scores 05/16/2019 11/18/2018 09/01/2017 03/15/2017  PHQ - 2 Score 0 0 0 0  PHQ- 9 Score - 0 - -     Cognitive Function- no cognitive concerns at this time  MMSE - Mini Mental State Exam 09/01/2017  Not completed: (No Data)     6CIT Screen 05/16/2019  What Year? 0 points  What month? 0 points  What time? 0 points  Count back from 20 0 points  Months in reverse 0 points  Repeat phrase 0 points  Total Score 0    Immunization History  Administered Date(s) Administered  . Influenza Split 02/16/2013  . Influenza Whole 01/23/2009, 01/16/2010  . Influenza, High Dose Seasonal PF 02/25/2015, 03/10/2016, 02/18/2017, 02/08/2018, 03/07/2019  . Influenza,inj,Quad PF,6+ Mos 02/06/2014  . Pneumococcal Conjugate-13 02/06/2014  . Pneumococcal Polysaccharide-23 03/27/1998, 04/14/2007, 12/08/2007, 03/10/2016  . Td 04/27/2004  . Tdap 02/06/2014, 09/22/2016    Qualifies for Shingles Vaccine? Discussed and patient will check with pharmacy for coverage.  Patient education handout provided    Screening Tests Health Maintenance  Topic Date Due  . Hepatitis C Screening  1951-03-19  . COLONOSCOPY  09/03/2021  . TETANUS/TDAP  09/23/2026  . INFLUENZA VACCINE  Completed  . PNA vac Low Risk Adult  Completed   Cancer Screenings: Lung: Low Dose CT Chest recommended if Age 22-80 years, 30 pack-year currently smoking OR have quit w/in 15years. Patient does not qualify. Colorectal: colonoscopy 09/03/16 with Dr. Fuller Plan      Plan:  I have personally reviewed and addressed the Medicare Annual Wellness questionnaire and have noted the following in the patient's chart:  A. Medical and social history B. Use of alcohol, tobacco or illicit drugs  C. Current medications and supplements D.  Functional ability and status E.  Nutritional status F.  Physical activity G. Advance directives H. List of other physicians I.  Hospitalizations, surgeries, and ER visits in previous 12 months J.  Hope such as hearing and vision if needed, cognitive and depression L. Referrals, records requested, and  appointments- none   In addition, I have reviewed and discussed with patient certain preventive protocols, quality metrics, and best practice recommendations. A written personalized care plan for preventive services as well as general preventive health recommendations were provided to patient.   Signed,  Denman George, LPN  Nurse Health Advisor   Nurse Notes: no additional

## 2019-05-16 NOTE — Progress Notes (Signed)
[name] is a 69 y.o. male who presents today for an office visit.  He is transferring care to this office.   Assessment/Plan:  Chronic Problems Addressed Today: Dyshidrotic eczema Stable.  Prednisone refilled.  OA (osteoarthritis) of knee Stable.  Continue management per orthopedics.  Moderate asthma without complication Stable.  Continue albuterol as needed.  Ganglion cyst of dorsum of left wrist Injection performed today.  See below procedure note.  He tolerated well.  Consider referral to hand surgery if persists.    Subjective:  HPI:  Patient is here to transfer care. He was previously with Dr [name] who has since retired.  He would like to transfer to this office as it is closer to his house.  He is doing well today without any significant issues.  He has a history of a ganglion cyst on his left wrist that he would like to have taken care of.  Is been there for at least a year.  Has tried hitting it with heavy objects which did not help.  No other treatments tried.  His stable, chronic medical conditions are outlined below:  # Dyshidrosis - Takes prednisone 10mg  daily  # Asthma - Uses Albuterol as needed  # Knee Osteoarthritis s/p right TKA - Follows with orthopedics.   PMH:  The following were reviewed and entered/updated in epic: Past Medical History:  Diagnosis Date  . Anxiety   . Arthritis   . Asthma   . At risk for sleep apnea    STOP-BANG = 4  SENT TO PCP 05-24-2013  . Eczema   . Edema of foot   . Finger wound, simple, open    left middle index finger - stitches and dressing occured on 09/22/2016- followed by Dr Daws at [facility]   . H/O hiatal hernia   . History of kidney stones   . HOH (hard of hearing)   . Hyperlipidemia   . Influenza A   . Influenza B    symptoms started 05-16-2013/  dx 05-17-2013 by pcp  . Left ankle injury    twisted left ankle   . Left ureteral calculus   . Pneumonia    hx of   . Rash    right leg- calf -   .  Wears dentures    bottom   Patient Active Problem List   Diagnosis Date Noted  . OA (osteoarthritis) of knee 12/05/2018  . Moderate asthma without complication [id number]  . Eczema of lower leg 08/16/2017  . Ganglion cyst of dorsum of left wrist 08/16/2017  . Pure hypercholesterolemia 04/05/2013  . 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 NOISE-INDUCED HEARING LOSS 12/17/2008  . Dyshidrotic eczema 12/08/2007  . NEPHROLITHIASIS, HX OF 11/10/2007  . Allergic rhinitis 06/24/2007   Past Surgical History:  Procedure Laterality Date  . ANTERIOR CERVICAL DECOMP/DISCECTOMY FUSION  01-27-2007   C6  --- C7  . COLONOSCOPY    . CYSTOSCOPY WITH URETEROSCOPY Left 05/26/2013   Procedure: CYSTOSCOPY WITH URETEROSCOPY, URETHERAL DILATATION,LEFT RETROGRADE, LASER LITHOTRIPSY, STENT PLACMENT, LEFT URETEROSCOPY;  Surgeon: Ailene Rud, MD;  Location: [facility];  Service: Urology;  Laterality: Left;  . FINGER FRACTURE SURGERY  AS CHILD  . HOLMIUM LASER APPLICATION Left [id number]   Procedure: HOLMIUM LASER APPLICATION;  Surgeon: Ailene Rud, MD;  Location: [facility];  Service: Urology;  Laterality: Left;  . LIPOMA EXCISION  05/17/2012   Procedure: EXCISION LIPOMA;  Surgeon: Gayland Curry, MD,FACS;  Location: [place];  Service: General;  Laterality: Left;  incisional biopsy of left shoulder soft tissue mass  . NASAL FRACTURE SURGERY  AS CHILD  . ORIF ULNAR FRACTURE Right AS CHILD  . ROTATOR CUFF REPAIR     right shoulder [id number] by [place]  . TOTAL KNEE ARTHROPLASTY Right 12/05/2018   Procedure: LEFT ARTHROSCOPY, CHONDROPLASTY;  Surgeon: Gaynelle Arabian, MD;  Location: [facility];  Service: Orthopedics;  Laterality: Right;  46min    Family History  Problem Relation Age of Onset  . Arthritis Other   . Hypertension Other   . Osteoporosis Other   . Heart disease Other   . Lung disease Other   .  Prostate cancer Father   . Heart disease Sister   . Colon cancer Neg Hx   . Colon polyps Neg Hx   . Esophageal cancer Neg Hx   . Kidney disease Neg Hx   . Rectal cancer Neg Hx   . Stomach cancer Neg Hx     Medications- reviewed and updated Current Outpatient Medications  Medication Sig Dispense Refill  . albuterol (PROAIR HFA) 108 (90 Base) MCG/ACT inhaler Inhale 2 puffs into the lungs every 6 (six) hours as needed for wheezing or shortness of breath.     . Ascorbic Acid (VITAMIN C) 1000 MG tablet Take 1,000 mg by mouth 2 (two) times daily.    . Cyanocobalamin (VITAMIN B-12) 5000 MCG TBDP Take 5,000 mcg by mouth daily.    . predniSONE (DELTASONE) 10 MG tablet Take 1 tablet (10 mg total) by mouth daily with breakfast. 90 tablet 3   No current facility-administered medications for this visit.    Allergies-reviewed and updated Allergies  Allergen Reactions  . Sesame Oil Itching and Swelling  . Tobramycin Itching and Swelling    Used in an eye oint-reaction    Social History   Socioeconomic History  . Marital status: Married    Spouse name: Not on file  . Number of children: 2  . Years of education: Not on file  . Highest education level: Not on file  Occupational History  . Occupation: Hospital doctor  Tobacco Use  . Smoking status: Never Smoker  . Smokeless tobacco: Never Used  Substance and Sexual Activity  . Alcohol use: Yes    Alcohol/week: 7.0 standard drinks    Types: 7 Cans of beer per week    Comment: beer- 2 beers day sometimes   . Drug use: No  . Sexual activity: Not on file  Other Topics Concern  . Not on file  Social History Narrative  . Not on file   Social Determinants of Health   Financial Resource Strain:   . Difficulty of Paying Living Expenses: Not on file  Food Insecurity:   . Worried About Charity fundraiser in the Last Year: Not on file  . Ran Out of Food in the Last Year: Not on file  Transportation Needs:   . Lack of  Transportation (Medical): Not on file  . Lack of Transportation (Non-Medical): Not on file  Physical Activity:   . Days of Exercise per Week: Not on file  . Minutes of Exercise per Session: Not on file  Stress:   . Feeling of Stress : Not on file  Social Connections:   . Frequency of Communication with Friends and Family: Not on file  . Frequency of Social Gatherings with Friends and Family: Not on file  . Attends  Religious Services: Not on file  . Active Member of Clubs or Organizations: Not on file  . Attends Archivist Meetings: Not on file  . Marital Status: Not on file          Objective:  Physical Exam: BP 130/72   Pulse 97   Temp 98.7 F (37.1 C)   Ht 5\' 11"  (1.803 m)   Wt 242 lb (109.8 kg)   SpO2 99%   BMI 33.75 kg/m   Gen: No acute distress, resting comfortably CV: Regular rate and rhythm with no murmurs appreciated Pulm: Normal work of breathing, clear to auscultation bilaterally with no crackles, wheezes, or rhonchi MSK: Approximately 0.5 cm cystic structure on dorsal aspect of left wrist Neuro: Grossly normal, moves all extremities Psych: Normal affect and thought content  Ganglion Cyst Aspiration/Injection Verbal consent was obtained. The site was prepped with Betadine solution. Topical ethyl chloride was applied. Needle was then inserted into the ganglion cyst and 2 ccs of a 1:1 Depo-Medrol 40 mg/mL :1% lidocaine mixture was then infiltrated into the cyst. Needle was then removed. Bandage was applied. Patient tolerated procedure well. Minimal blood loss. No complications.   Time Spent: 45 minutes of total time was spent on the date of the encounter performing the following actions: chart review prior to seeing the patient, obtaining history, performing a medically necessary exam, counseling on the treatment plan, placing orders, and documenting in our EHR. This time does not include time spent performing above procedure.        [name]. Jerline Pain,  MD 05/16/2019 2:24 PM

## 2019-05-16 NOTE — Assessment & Plan Note (Signed)
Injection performed today.  See below procedure note.  He tolerated well.  Consider referral to hand surgery if persists.

## 2019-05-16 NOTE — Patient Instructions (Signed)
[name] , Thank you for taking time to come for your Medicare Wellness Visit. I appreciate your ongoing commitment to your health goals. Please review the following plan we discussed and let me know if I can assist you in the future.   Screening recommendations/referrals: Colorectal Screening: up to date; last colonoscopy 09/03/16  Vision and Dental Exams: Recommended annual ophthalmology exams for early detection of glaucoma and other disorders of the eye Recommended annual dental exams for proper oral hygiene  Vaccinations: Influenza vaccine: completed 03/07/19 Pneumococcal vaccine: up tod ate; last 03/10/16 Tdap vaccine: up to date; last 09/22/16  Shingles vaccine: Please call your insurance company to determine your out of pocket expense for the Shingrix vaccine. You may receive this vaccine at your local pharmacy.  Advanced directives: Please bring a copy of your POA (Power of Attorney) and/or Living Will to your next appointment.  Goals: Recommend to drink at least 6-8 8oz glasses of water per day and consume a balanced diet rich in fresh fruits and vegetables.   Next appointment: Please schedule your Annual Wellness Visit with your Nurse Health Advisor in one year.  Preventive Care 6 Years and Older, Male Preventive care refers to lifestyle choices and visits with your health care provider that can promote health and wellness. What does preventive care include?  A yearly physical exam. This is also called an annual well check.  Dental exams once or twice a year.  Routine eye exams. Ask your health care provider how often you should have your eyes checked.  Personal lifestyle choices, including:  Daily care of your teeth and gums.  Regular physical activity.  Eating a healthy diet.  Avoiding tobacco and drug use.  Limiting alcohol use.  Practicing safe sex.  Taking low doses of aspirin every day if recommended by your health care provider..  Taking vitamin and  mineral supplements as recommended by your health care provider. What happens during an annual well check? The services and screenings done by your health care provider during your annual well check will depend on your age, overall health, lifestyle risk factors, and family history of disease. Counseling  Your health care provider may ask you questions about your:  Alcohol use.  Tobacco use.  Drug use.  Emotional well-being.  Home and relationship well-being.  Sexual activity.  Eating habits.  History of falls.  Memory and ability to understand (cognition).  Work and work Statistician. Screening  You may have the following tests or measurements:  Height, weight, and BMI.  Blood pressure.  Lipid and cholesterol levels. These may be checked every 5 years, or more frequently if you are over 64 years old.  Skin check.  Lung cancer screening. You may have this screening every year starting at age 56 if you have a 30-pack-year history of smoking and currently smoke or have quit within the past 15 years.  Fecal occult blood test (FOBT) of the stool. You may have this test every year starting at age 74.  Flexible sigmoidoscopy or colonoscopy. You may have a sigmoidoscopy every 5 years or a colonoscopy every 10 years starting at age 37.  Prostate cancer screening. Recommendations will vary depending on your family history and other risks.  Hepatitis C blood test.  Hepatitis B blood test.  Sexually transmitted disease (STD) testing.  Diabetes screening. This is done by checking your blood sugar (glucose) after you have not eaten for a while (fasting). You may have this done every 1-3 years.  Abdominal aortic  aneurysm (AAA) screening. You may need this if you are a current or former smoker.  Osteoporosis. You may be screened starting at age 37 if you are at high risk. Talk with your health care provider about your test results, treatment options, and if necessary, the need  for more tests. Vaccines  Your health care provider may recommend certain vaccines, such as:  Influenza vaccine. This is recommended every year.  Tetanus, diphtheria, and acellular pertussis (Tdap, Td) vaccine. You may need a Td booster every 10 years.  Zoster vaccine. You may need this after age 53.  Pneumococcal 13-valent conjugate (PCV13) vaccine. One dose is recommended after age 47.  Pneumococcal polysaccharide (PPSV23) vaccine. One dose is recommended after age 39. Talk to your health care provider about which screenings and vaccines you need and how often you need them. This information is not intended to replace advice given to you by your health care provider. Make sure you discuss any questions you have with your health care provider. Document Released: 05/10/2015 Document Revised: 01/01/2016 Document Reviewed: 02/12/2015 Elsevier Interactive Patient Education  2017 [employer] Prevention in the Home Falls can cause injuries. They can happen to people of all ages. There are many things you can do to make your home safe and to help prevent falls. What can I do on the outside of my home?  Regularly fix the edges of walkways and driveways and fix any cracks.  Remove anything that might make you trip as you walk through a door, such as a raised step or threshold.  Trim any bushes or trees on the path to your home.  Use bright outdoor lighting.  Clear any walking paths of anything that might make someone trip, such as rocks or tools.  Regularly check to see if handrails are loose or broken. Make sure that both sides of any steps have handrails.  Any raised decks and porches should have guardrails on the edges.  Have any leaves, snow, or ice cleared regularly.  Use sand or salt on walking paths during winter.  Clean up any spills in your garage right away. This includes oil or grease spills. What can I do in the bathroom?  Use night lights.  Install grab bars  by the toilet and in the tub and shower. Do not use towel bars as grab bars.  Use non-skid mats or decals in the tub or shower.  If you need to sit down in the shower, use a plastic, non-slip stool.  Keep the floor dry. Clean up any water that spills on the floor as soon as it happens.  Remove soap buildup in the tub or shower regularly.  Attach bath mats securely with double-sided non-slip rug tape.  Do not have throw rugs and other things on the floor that can make you trip. What can I do in the bedroom?  Use night lights.  Make sure that you have a light by your bed that is easy to reach.  Do not use any sheets or blankets that are too big for your bed. They should not hang down onto the floor.  Have a firm chair that has side arms. You can use this for support while you get dressed.  Do not have throw rugs and other things on the floor that can make you trip. What can I do in the kitchen?  Clean up any spills right away.  Avoid walking on wet floors.  Keep items that you use a lot  in easy-to-reach places.  If you need to reach something above you, use a strong step stool that has a grab bar.  Keep electrical cords out of the way.  Do not use floor polish or wax that makes floors slippery. If you must use wax, use non-skid floor wax.  Do not have throw rugs and other things on the floor that can make you trip. What can I do with my stairs?  Do not leave any items on the stairs.  Make sure that there are handrails on both sides of the stairs and use them. Fix handrails that are broken or loose. Make sure that handrails are as long as the stairways.  Check any carpeting to make sure that it is firmly attached to the stairs. Fix any carpet that is loose or worn.  Avoid having throw rugs at the top or bottom of the stairs. If you do have throw rugs, attach them to the floor with carpet tape.  Make sure that you have a light switch at the top of the stairs and the  bottom of the stairs. If you do not have them, ask someone to add them for you. What else can I do to help prevent falls?  Wear shoes that:  Do not have high heels.  Have rubber bottoms.  Are comfortable and fit you well.  Are closed at the toe. Do not wear sandals.  If you use a stepladder:  Make sure that it is fully opened. Do not climb a closed stepladder.  Make sure that both sides of the stepladder are locked into place.  Ask someone to hold it for you, if possible.  Clearly mark and make sure that you can see:  Any grab bars or handrails.  First and last steps.  Where the edge of each step is.  Use tools that help you move around (mobility aids) if they are needed. These include:  Canes.  Walkers.  Scooters.  Crutches.  Turn on the lights when you go into a dark area. Replace any light bulbs as soon as they burn out.  Set up your furniture so you have a clear path. Avoid moving your furniture around.  If any of your floors are uneven, fix them.  If there are any pets around you, be aware of where they are.  Review your medicines with your doctor. Some medicines can make you feel dizzy. This can increase your chance of falling. Ask your doctor what other things that you can do to help prevent falls. This information is not intended to replace advice given to you by your health care provider. Make sure you discuss any questions you have with your health care provider. Document Released: 02/07/2009 Document Revised: 09/19/2015 Document Reviewed: 05/18/2014 Elsevier Interactive Patient Education  2017 [employer].

## 2019-08-08 ENCOUNTER — Other Ambulatory Visit: Payer: Self-pay

## 2019-08-09 ENCOUNTER — Ambulatory Visit (INDEPENDENT_AMBULATORY_CARE_PROVIDER_SITE_OTHER): Payer: PPO | Admitting: Family Medicine

## 2019-08-09 ENCOUNTER — Encounter: Payer: Self-pay | Admitting: Family Medicine

## 2019-08-09 VITALS — BP 130/80 | HR 83 | Temp 98.8°F | Ht 71.0 in | Wt 247.0 lb

## 2019-08-09 DIAGNOSIS — S81811A Laceration without foreign body, right lower leg, initial encounter: Secondary | ICD-10-CM

## 2019-08-09 NOTE — Progress Notes (Signed)
   AHMEER GABIN is a 69 y.o. male who presents today for an office visit.  Assessment/Plan:  Skin tear No red flags.  No signs of infection.  Recommended continuing topical antibiotic ointment and keeping area clean and dry.  Last tetanus done 3 years ago.  Does not need repeat today.  Discussed warning signs and reasons to return to care.  Should heal normally without complication.    Subjective:  HPI:  Patient suffered a skin tear while working in his yard.  Thinks that he got caught on barb wire.  Had immediately some bleeding and pain that has been controlled.  Minimal pain to the area now.  Clean the area with hydrogen peroxide initially.  Has been placing neomycin on the area.       Objective:  Physical Exam: BP 130/80 (BP Location: Left Arm, Patient Position: Sitting, Cuff Size: Large)   Pulse 83   Temp 98.8 F (37.1 C) (Temporal)   Ht 5\' 11"  (1.803 m)   Wt 247 lb (112 kg)   SpO2 96%   BMI 34.45 kg/m   Gen: No acute distress, resting comfortably Skin: Approximately 1 cm diameter skin tear on right anterior lower leg.      [name]. Jerline Pain, MD 08/09/2019 10:38 AM

## 2019-08-09 NOTE — Patient Instructions (Signed)
It was very nice to see you today!  The skin tear should heal normally.  Please keep clean and bandaged.  Let me know if it shows any signs of infection.   Take care, Dr Jerline Pain  Please try these tips to maintain a healthy lifestyle:   Eat at least 3 REAL meals and 1-2 snacks per day.  Aim for no more than 5 hours between eating.  If you eat breakfast, please do so within one hour of getting up.    Each meal should contain half fruits/vegetables, one quarter protein, and one quarter carbs (no bigger than a computer mouse)   Cut down on sweet beverages. This includes juice, soda, and sweet tea.     Drink at least 1 glass of water with each meal and aim for at least 8 glasses per day   Exercise at least 150 minutes every week.

## 2019-10-09 ENCOUNTER — Telehealth: Payer: Self-pay | Admitting: Family Medicine

## 2019-10-09 NOTE — Telephone Encounter (Signed)
I have scheduled pt for wed at 9:40am with Dr. Jerline Pain for a Cortizone injection.  Patient would like to know if Dr. Jerline Pain would be able to work him in sooner.   Please advise.

## 2019-10-09 NOTE — Telephone Encounter (Signed)
No open appt

## 2019-10-09 NOTE — Telephone Encounter (Signed)
Left pt. vm

## 2019-10-10 ENCOUNTER — Encounter: Payer: Self-pay | Admitting: Family Medicine

## 2019-10-10 ENCOUNTER — Other Ambulatory Visit: Payer: Self-pay

## 2019-10-10 ENCOUNTER — Ambulatory Visit (INDEPENDENT_AMBULATORY_CARE_PROVIDER_SITE_OTHER): Payer: PPO | Admitting: Family Medicine

## 2019-10-10 VITALS — BP 124/80 | HR 87 | Temp 98.2°F | Ht 71.0 in | Wt 242.2 lb

## 2019-10-10 DIAGNOSIS — N529 Male erectile dysfunction, unspecified: Secondary | ICD-10-CM | POA: Insufficient documentation

## 2019-10-10 DIAGNOSIS — J45909 Unspecified asthma, uncomplicated: Secondary | ICD-10-CM

## 2019-10-10 DIAGNOSIS — L309 Dermatitis, unspecified: Secondary | ICD-10-CM | POA: Diagnosis not present

## 2019-10-10 DIAGNOSIS — H1013 Acute atopic conjunctivitis, bilateral: Secondary | ICD-10-CM

## 2019-10-10 DIAGNOSIS — M67432 Ganglion, left wrist: Secondary | ICD-10-CM | POA: Diagnosis not present

## 2019-10-10 MED ORDER — TADALAFIL 10 MG PO TABS: 10.0000 mg | ORAL_TABLET | Freq: Every day | ORAL | 5 refills | Status: DC | PRN

## 2019-10-10 MED ORDER — TRIAMCINOLONE ACETONIDE 0.5 % EX OINT: 1.0000 "application " | TOPICAL_OINTMENT | Freq: Two times a day (BID) | CUTANEOUS | 0 refills | Status: DC

## 2019-10-10 MED ORDER — METHYLPREDNISOLONE ACETATE 40 MG/ML IJ SUSP
40.0000 mg | Freq: Once | INTRAMUSCULAR | Status: AC
  Administered 2019-10-10: 40 mg via INTRAMUSCULAR

## 2019-10-10 MED ORDER — ALBUTEROL SULFATE HFA 108 (90 BASE) MCG/ACT IN AERS: 2.0000 | INHALATION_SPRAY | Freq: Four times a day (QID) | RESPIRATORY_TRACT | 1 refills | Status: DC | PRN

## 2019-10-10 NOTE — Assessment & Plan Note (Signed)
Stable.  Albuterol refilled today.

## 2019-10-10 NOTE — Progress Notes (Signed)
   [name] is a 69 y.o. male who presents today for an office visit.  Assessment/Plan:  Chronic Problems Addressed Today: Erectile dysfunction We will try Cialis 10 mg daily as needed.  Discussed potential side effects.  Dermatitis Start topical triamcinolone as needed.   Moderate asthma without complication Stable.  Albuterol refilled today.  Ganglion cyst of dorsum of left wrist Injection performed today.  He tolerated well.  See below procedure note.  Advised him to follow-up with hand surgery if not improving or if it returns.      Subjective:  HPI:  Patient with left wrist pain for the last few weeks.  Had ganglion cyst injected about 5 months ago.  Had help for quite a while however seems to come back.  Worse with certain motions.  He also requests refill on albuterol today.  He is also had a flareup of dermatitis on right lower extremity.  Usually gets this from outside doing yard work.  See A/p for status of other chronic. conditions.        Objective:  Physical Exam: BP 124/80   Pulse 87   Temp 98.2 F (36.8 C)   Ht 5\' 11"  (1.803 m)   Wt 242 lb 3.2 oz (109.9 kg)   SpO2 96%   BMI 33.78 kg/m   Gen: No acute distress, resting comfortably CV: Regular rate and rhythm with no murmurs appreciated Pulm: Normal work of breathing, clear to auscultation bilaterally with no crackles, wheezes, or rhonchi MSK: Left dorsal wrist a small ganglion cyst. Skin: Erythematous rash involving inner aspect of right lower extremity. Neuro: Grossly normal, moves all extremities Psych: Normal affect and thought content  Ganglion Cyst Aspiration/Injection Verbal consent was obtained. The site was prepped with Betadine solution. Topical ethyl chloride was applied.  Two ccs of a 1:1 Depo-Medrol 40 mg/mL :1% lidocaine mixture was then infiltrated into the cyst. Needle was then removed. Bandage was applied. Patient tolerated procedure well. Minimal blood loss. No complications.         [name]. Jerline Pain, MD 10/10/2019 10:24 AM

## 2019-10-10 NOTE — Patient Instructions (Addendum)
It was very nice to see you today!  We injected your cyst today. Please let me know if it comes back and I will refer you to see a hand specialist.  I will send in a cream for the rash on your legs.  Please try the cialis.  Take care, Dr Jerline Pain  Please try these tips to maintain a healthy lifestyle:   Eat at least 3 REAL meals and 1-2 snacks per day.  Aim for no more than 5 hours between eating.  If you eat breakfast, please do so within one hour of getting up.    Each meal should contain half fruits/vegetables, one quarter protein, and one quarter carbs (no bigger than a computer mouse)   Cut down on sweet beverages. This includes juice, soda, and sweet tea.     Drink at least 1 glass of water with each meal and aim for at least 8 glasses per day   Exercise at least 150 minutes every week.

## 2019-10-10 NOTE — Addendum Note (Signed)
Addended by: Loralyn Freshwater on: 10/10/2019 10:54 AM   Modules accepted: Orders

## 2019-10-10 NOTE — Assessment & Plan Note (Signed)
Injection performed today.  He tolerated well.  See below procedure note.  Advised him to follow-up with hand surgery if not improving or if it returns.

## 2019-10-10 NOTE — Assessment & Plan Note (Signed)
We will try Cialis 10 mg daily as needed.  Discussed potential side effects.

## 2019-10-10 NOTE — Assessment & Plan Note (Signed)
Start topical triamcinolone as needed.

## 2019-10-11 ENCOUNTER — Ambulatory Visit: Payer: PPO | Admitting: Family Medicine

## 2019-11-13 ENCOUNTER — Ambulatory Visit: Payer: PPO | Admitting: Family Medicine

## 2019-12-29 ENCOUNTER — Other Ambulatory Visit: Payer: Self-pay | Admitting: Family Medicine

## 2019-12-29 NOTE — Telephone Encounter (Signed)
LAST APPOINTMENT DATE: 10/10/2019   NEXT APPOINTMENT DATE: 01/23/2020    LAST REFILL: 05/16/2019  QTY: 90 ref 3

## 2020-01-02 ENCOUNTER — Other Ambulatory Visit: Payer: Self-pay

## 2020-01-02 ENCOUNTER — Telehealth (INDEPENDENT_AMBULATORY_CARE_PROVIDER_SITE_OTHER): Payer: PPO | Admitting: Family Medicine

## 2020-01-02 DIAGNOSIS — J45901 Unspecified asthma with (acute) exacerbation: Secondary | ICD-10-CM

## 2020-01-02 DIAGNOSIS — R0981 Nasal congestion: Secondary | ICD-10-CM

## 2020-01-02 DIAGNOSIS — R059 Cough, unspecified: Secondary | ICD-10-CM

## 2020-01-02 DIAGNOSIS — R05 Cough: Secondary | ICD-10-CM | POA: Diagnosis not present

## 2020-01-02 MED ORDER — BENZONATATE 100 MG PO CAPS: 100.0000 mg | ORAL_CAPSULE | Freq: Three times a day (TID) | ORAL | 0 refills | Status: DC | PRN

## 2020-01-02 MED ORDER — DOXYCYCLINE HYCLATE 100 MG PO TABS: 100.0000 mg | ORAL_TABLET | Freq: Two times a day (BID) | ORAL | 0 refills | Status: DC

## 2020-01-02 NOTE — Patient Instructions (Signed)
-  I sent the medication(s) we discussed to your pharmacy: Meds ordered this encounter  Medications  . doxycycline (VIBRA-TABS) 100 MG tablet    Sig: Take 1 tablet (100 mg total) by mouth 2 (two) times daily.    Dispense:  14 tablet    Refill:  0  . benzonatate (TESSALON PERLES) 100 MG capsule    Sig: Take 1 capsule (100 mg total) by mouth 3 (three) times daily as needed.    Dispense:  20 capsule    Refill:  0    I hope you are feeling better soon! Seek care promptly if your symptoms worsen, new concerns arise or you are not improving with treatment.

## 2020-01-02 NOTE — Progress Notes (Signed)
Virtual Visit via Video Note  I connected with [name]  on 01/02/20 at 11:00 AM EDT by a video enabled telemedicine application and verified that I am speaking with the correct person using two identifiers.  Location patient: home Location provider:work or home office Persons participating in the virtual visit: patient, provider, wife  I discussed the limitations of evaluation and management by telemedicine and the availability of in person appointments. The patient expressed understanding and agreed to proceed.   HPI:  Acute visit for upper resp issues: -started 4 days ago -symptoms include: nasal congestion, PND, cough, wheezing and some asthma symptoms - SOB at night -he is using his albuterol once per night, but even with the alb he still has some SOB at night -coughing up a lot of thick white mucus -denies fevers, NVD, loss of taste or smell, body aches, malaise, inability to tol oral intake -T 99.3 -grand-daughter stayed with him recently and she had a cold - she tested negative for covid -Fully vaccinated for COVID19 with Moderna -reports gets this frequently, sometimes requires abx, increased prednisone (on low dose for other issues), cough syrup  ROS: See pertinent positives and negatives per HPI.  Past Medical History:  Diagnosis Date  . Anxiety   . Arthritis   . Asthma   . At risk for sleep apnea    STOP-BANG = 4  SENT TO PCP 05-24-2013  . Eczema   . Edema of foot   . Finger wound, simple, open    left middle index finger - stitches and dressing occured on 09/22/2016- followed by Dr Daws at [facility]   . H/O hiatal hernia   . History of kidney stones   . HOH (hard of hearing)   . Hyperlipidemia   . Influenza A   . Influenza B    symptoms started 05-16-2013/  dx 05-17-2013 by pcp  . Left ankle injury    twisted left ankle   . Left ureteral calculus   . Pneumonia    hx of   . Rash    right leg- calf -   . Wears dentures    bottom    Past Surgical  History:  Procedure Laterality Date  . ANTERIOR CERVICAL DECOMP/DISCECTOMY FUSION  01-27-2007   C6  --- C7  . COLONOSCOPY    . CYSTOSCOPY WITH URETEROSCOPY Left 05/26/2013   Procedure: CYSTOSCOPY WITH URETEROSCOPY, URETHERAL DILATATION,LEFT RETROGRADE, LASER LITHOTRIPSY, STENT PLACMENT, LEFT URETEROSCOPY;  Surgeon: Ailene Rud, MD;  Location: [facility];  Service: Urology;  Laterality: Left;  . FINGER FRACTURE SURGERY  AS CHILD  . HOLMIUM LASER APPLICATION Left 5/99/3570   Procedure: HOLMIUM LASER APPLICATION;  Surgeon: Ailene Rud, MD;  Location: [facility];  Service: Urology;  Laterality: Left;  . LIPOMA EXCISION  05/17/2012   Procedure: EXCISION LIPOMA;  Surgeon: Gayland Curry, MD,FACS;  Location: [place];  Service: General;  Laterality: Left;  incisional biopsy of left shoulder soft tissue mass  . NASAL FRACTURE SURGERY  AS CHILD  . ORIF ULNAR FRACTURE Right AS CHILD  . ROTATOR CUFF REPAIR     right shoulder [id number] by [place]  . TOTAL KNEE ARTHROPLASTY Right 12/05/2018   Procedure: LEFT ARTHROSCOPY, CHONDROPLASTY;  Surgeon: Gaynelle Arabian, MD;  Location: [facility];  Service: Orthopedics;  Laterality: Right;  73min    Family History  Problem Relation Age of Onset  . Arthritis Other   . Hypertension Other   .  Osteoporosis Other   . Heart disease Other   . Lung disease Other   . Prostate cancer Father   . Heart disease Sister   . Colon cancer Neg Hx   . Colon polyps Neg Hx   . Esophageal cancer Neg Hx   . Kidney disease Neg Hx   . Rectal cancer Neg Hx   . Stomach cancer Neg Hx     SOCIAL HX: see hpi   Current Outpatient Medications:  .  albuterol (PROAIR HFA) 108 (90 Base) MCG/ACT inhaler, Inhale 2 puffs into the lungs every 6 (six) hours as needed for wheezing or shortness of breath., Disp: 18 g, Rfl: 1 .  Ascorbic Acid (VITAMIN C) 1000 MG tablet, Take 1,000 mg by mouth 2 (two)  times daily., Disp: , Rfl:  .  Cyanocobalamin (VITAMIN B-12) 5000 MCG TBDP, Take 5,000 mcg by mouth daily., Disp: , Rfl:  .  predniSONE (DELTASONE) 10 MG tablet, USE AS INSTRUCTED BUT FOR FIRST 2 WEEKS- TAKE 2 TABS FOR 3 DAYS, THEN 1 TAB FOR 3 DAYS, THEN 1/2 TAB FOR 3 DAYS, THEN 1/2 TAB EVERY OTHER DAY FOR 6 DAYS., Disp: 40 tablet, Rfl: 8 .  tadalafil (CIALIS) 10 MG tablet, Take 1 tablet (10 mg total) by mouth daily as needed for erectile dysfunction., Disp: 30 tablet, Rfl: 5 .  triamcinolone ointment (KENALOG) 0.5 %, Apply 1 application topically 2 (two) times daily., Disp: 30 g, Rfl: 0 .  benzonatate (TESSALON PERLES) 100 MG capsule, Take 1 capsule (100 mg total) by mouth 3 (three) times daily as needed., Disp: 20 capsule, Rfl: 0 .  doxycycline (VIBRA-TABS) 100 MG tablet, Take 1 tablet (100 mg total) by mouth 2 (two) times daily., Disp: 14 tablet, Rfl: 0  EXAM:  VITALS per patient if applicable:  GENERAL: alert, oriented, appears well and in no acute distress  HEENT: atraumatic, conjunttiva clear, no obvious abnormalities on inspection of external nose and ears  NECK: normal movements of the head and neck  LUNGS: on inspection no signs of respiratory distress, breathing rate appears normal, no obvious gross SOB, gasping or wheezing, occ cough during visit  CV: no obvious cyanosis  MS: moves all visible extremities without noticeable abnormality  PSYCH/NEURO: pleasant and cooperative, no obvious depression or anxiety, speech and thought processing grossly intact  ASSESSMENT AND PLAN:  Discussed the following assessment and plan:  Cough  Nasal congestion  Asthma with acute exacerbation, unspecified asthma severity, unspecified whether persistent  -we discussed possible serious and likely etiologies, options for evaluation and workup, limitations of telemedicine visit vs in person visit, treatment, treatment risks and precautions. Pt prefers to treat via telemedicine empirically  rather then risking or undertaking an in person visit at this moment. Query resp infection with asthma exacerbation. COVID19 also possible, though less likely given vaccinated. Opted to treat with prn alb, Tessalon for cough, increased prednisone to 40mg  daily x 4 days if alb not helping asthma symptoms and doxy 100mg  bid x 7 days given her report increased amounts of discolored sputum. Also, advised COVID19 testing, discussed treatment options, precautions, potential complications and isolation if covid. # provided for COVID19 [facility] in case positive covid test. Advised follow up  - he declined schedule follow up but agrees to call if needed. He did request a cough syrup containing a cough syrup, however, per telemedicine visit guidelines did not Rx and explained. He may contact his PCP if other options not working and feels he needs. Advised to  seek prompt follow up telemedicine visit or in person care if worsening, new symptoms arise, or if is not improving with treatment. Did let him know that I only do telemedicine on Tuesdays and Thursdays for [place] and advised follow up visit with PCP or [facility] if needs follow up or if any further questions arise to avoid any delays.   I discussed the assessment and treatment plan with the patient. The patient was provided an opportunity to ask questions and all were answered. The patient agreed with the plan and demonstrated an understanding of the instructions.   The patient was advised to call back or seek an in-person evaluation if the symptoms worsen or if the condition fails to improve as anticipated.   Lucretia Kern, DO

## 2020-01-03 ENCOUNTER — Telehealth: Payer: Self-pay

## 2020-01-03 NOTE — Telephone Encounter (Signed)
Patient stated cough is getting worst. Requesting Rx hydrocodone homatropine syrup  Stated this Rx work in the past for cough. Was seen by Dr Maudie Mercury 01/02/2020

## 2020-01-03 NOTE — Telephone Encounter (Signed)
Pt has a question in regards to his sinus issues and cough

## 2020-01-04 MED ORDER — HYDROCODONE-HOMATROPINE 5-1.5 MG/5ML PO SYRP: 5.0000 mL | ORAL_SOLUTION | Freq: Three times a day (TID) | ORAL | 0 refills | Status: DC | PRN

## 2020-01-04 NOTE — Telephone Encounter (Signed)
Pt notifed.

## 2020-01-04 NOTE — Telephone Encounter (Signed)
Pt is calling to follow up on this 

## 2020-01-05 ENCOUNTER — Other Ambulatory Visit: Payer: PPO

## 2020-01-23 ENCOUNTER — Other Ambulatory Visit: Payer: Self-pay

## 2020-01-23 ENCOUNTER — Ambulatory Visit (INDEPENDENT_AMBULATORY_CARE_PROVIDER_SITE_OTHER): Payer: PPO | Admitting: Family Medicine

## 2020-01-23 ENCOUNTER — Encounter: Payer: Self-pay | Admitting: Family Medicine

## 2020-01-23 VITALS — BP 150/82 | HR 91 | Temp 98.7°F | Ht 71.0 in | Wt 247.0 lb

## 2020-01-23 DIAGNOSIS — N529 Male erectile dysfunction, unspecified: Secondary | ICD-10-CM | POA: Diagnosis not present

## 2020-01-23 DIAGNOSIS — M67432 Ganglion, left wrist: Secondary | ICD-10-CM

## 2020-01-23 DIAGNOSIS — J45909 Unspecified asthma, uncomplicated: Secondary | ICD-10-CM

## 2020-01-23 DIAGNOSIS — L309 Dermatitis, unspecified: Secondary | ICD-10-CM | POA: Diagnosis not present

## 2020-01-23 DIAGNOSIS — Z23 Encounter for immunization: Secondary | ICD-10-CM

## 2020-01-23 MED ORDER — DICLOFENAC SODIUM 75 MG PO TBEC: 75.0000 mg | DELAYED_RELEASE_TABLET | Freq: Two times a day (BID) | ORAL | 0 refills | Status: DC

## 2020-01-23 MED ORDER — TRIAMCINOLONE ACETONIDE 0.5 % EX OINT
1.0000 | TOPICAL_OINTMENT | Freq: Two times a day (BID) | CUTANEOUS | 0 refills | Status: DC
Start: 2020-01-23 — End: 2020-03-25

## 2020-01-23 MED ORDER — DICLOFENAC SODIUM 1 % EX GEL: 4.0000 g | Freq: Four times a day (QID) | CUTANEOUS | 3 refills | Status: DC | PRN

## 2020-01-23 NOTE — Assessment & Plan Note (Signed)
Stable.  Continue Cialis 10 mg daily.

## 2020-01-23 NOTE — Assessment & Plan Note (Signed)
Had recent flareup that is doing well now.

## 2020-01-23 NOTE — Assessment & Plan Note (Signed)
Appearance consistent with psoriasis. Symptomare overall manageable. Continue triamcinolone.

## 2020-01-23 NOTE — Progress Notes (Signed)
   [name] is a 69 y.o. male who presents today for an office visit.  Assessment/Plan:  Chronic Problems Addressed Today: Dermatitis Appearance consistent with psoriasis. Symptomare overall manageable. Continue triamcinolone.   Ganglion cyst of dorsum of left wrist We will send in diclofenac tablet and gel per request.  Will follow up with orthopedic soon.  May need surgical excision.  Moderate asthma without complication Had recent flareup that is doing well now.  Erectile dysfunction Stable.  Continue Cialis 10 mg daily.  Preventative Healthcare Flu vaccine given today.  Advised patient to get Covid booster due to age and comorbidities.    Subjective:  HPI:  See A/p.        Objective:  Physical Exam: BP (!) 150/82   Pulse 91   Temp 98.7 F (37.1 C) (Temporal)   Ht 5\' 11"  (1.803 m)   Wt 247 lb (112 kg)   SpO2 96%   BMI 34.45 kg/m   Gen: No acute distress, resting comfortably Neuro: Grossly normal, moves all extremities Psych: Normal affect and thought content  Time Spent: 41 minutes of total time was spent on the date of the encounter performing the following actions: chart review prior to seeing the patient including recent virtual visit with Colin Benton, obtaining history, performing a medically necessary exam, counseling on the treatment plan including need for Covid booster, placing orders, and documenting in our EHR.        [name]. Jerline Pain, MD 01/23/2020 2:11 PM

## 2020-01-23 NOTE — Assessment & Plan Note (Signed)
We will send in diclofenac tablet and gel per request.  Will follow up with orthopedic soon.  May need surgical excision.

## 2020-01-23 NOTE — Patient Instructions (Addendum)
It was very nice to see you today!  I think your rash is due to psoriasis. Please try the triamcinolone.   I will refill the diclofenac.  I will see you back in 6 months.  Come back to see me sooner if needed.   Take care, Dr Jerline Pain  Please try these tips to maintain a healthy lifestyle:   Eat at least 3 REAL meals and 1-2 snacks per day.  Aim for no more than 5 hours between eating.  If you eat breakfast, please do so within one hour of getting up.    Each meal should contain half fruits/vegetables, one quarter protein, and one quarter carbs (no bigger than a computer mouse)   Cut down on sweet beverages. This includes juice, soda, and sweet tea.     Drink at least 1 glass of water with each meal and aim for at least 8 glasses per day   Exercise at least 150 minutes every week.

## 2020-02-12 ENCOUNTER — Telehealth: Payer: Self-pay

## 2020-02-12 NOTE — Telephone Encounter (Signed)
Patient called in this afternoon, saying that he was in the parking lot and wondered if we had any appointments available. He said him and his wife were doing lawn work yesterday and may have pulled something in his hip/back area, explained to patient that we were completely full today and if he felt that he couldn't wait until Wednesday -our first opening- then would recommend urgent care or [place] then said he would give Sports Medicine a call to see what they had available.

## 2020-02-12 NOTE — Telephone Encounter (Signed)
Noted  

## 2020-02-19 DIAGNOSIS — Z96651 Presence of right artificial knee joint: Secondary | ICD-10-CM | POA: Diagnosis not present

## 2020-02-28 DIAGNOSIS — M19032 Primary osteoarthritis, left wrist: Secondary | ICD-10-CM | POA: Diagnosis not present

## 2020-02-28 DIAGNOSIS — M67432 Ganglion, left wrist: Secondary | ICD-10-CM | POA: Diagnosis not present

## 2020-02-29 DIAGNOSIS — L309 Dermatitis, unspecified: Secondary | ICD-10-CM | POA: Diagnosis not present

## 2020-02-29 DIAGNOSIS — L570 Actinic keratosis: Secondary | ICD-10-CM | POA: Diagnosis not present

## 2020-02-29 DIAGNOSIS — L819 Disorder of pigmentation, unspecified: Secondary | ICD-10-CM | POA: Diagnosis not present

## 2020-02-29 DIAGNOSIS — L438 Other lichen planus: Secondary | ICD-10-CM | POA: Diagnosis not present

## 2020-02-29 DIAGNOSIS — L821 Other seborrheic keratosis: Secondary | ICD-10-CM | POA: Diagnosis not present

## 2020-02-29 DIAGNOSIS — D1801 Hemangioma of skin and subcutaneous tissue: Secondary | ICD-10-CM | POA: Diagnosis not present

## 2020-02-29 DIAGNOSIS — L814 Other melanin hyperpigmentation: Secondary | ICD-10-CM | POA: Diagnosis not present

## 2020-02-29 DIAGNOSIS — D229 Melanocytic nevi, unspecified: Secondary | ICD-10-CM | POA: Diagnosis not present

## 2020-03-25 ENCOUNTER — Other Ambulatory Visit: Payer: Self-pay

## 2020-03-25 ENCOUNTER — Ambulatory Visit (INDEPENDENT_AMBULATORY_CARE_PROVIDER_SITE_OTHER): Payer: PPO | Admitting: Family Medicine

## 2020-03-25 ENCOUNTER — Encounter: Payer: Self-pay | Admitting: Family Medicine

## 2020-03-25 VITALS — BP 143/79 | HR 76 | Temp 98.2°F | Ht 71.0 in | Wt 246.4 lb

## 2020-03-25 DIAGNOSIS — S76311A Strain of muscle, fascia and tendon of the posterior muscle group at thigh level, right thigh, initial encounter: Secondary | ICD-10-CM | POA: Diagnosis not present

## 2020-03-25 DIAGNOSIS — J45909 Unspecified asthma, uncomplicated: Secondary | ICD-10-CM

## 2020-03-25 DIAGNOSIS — L301 Dyshidrosis [pompholyx]: Secondary | ICD-10-CM | POA: Diagnosis not present

## 2020-03-25 MED ORDER — DICLOFENAC SODIUM 1 % EX GEL
4.0000 g | Freq: Four times a day (QID) | CUTANEOUS | 3 refills | Status: DC | PRN
Start: 2020-03-25 — End: 2020-12-12

## 2020-03-25 NOTE — Assessment & Plan Note (Signed)
Stable.  Continue prednisone.

## 2020-03-25 NOTE — Progress Notes (Signed)
   ASAH LAMAY is a 69 y.o. male who presents today for an office visit.  Assessment/Plan:  New/Acute Problems: Right hamstring Strain No red flags.  Already improving.  Discussed home exercises and handout was given.  Also continue diclofenac.  If not improving over the next several weeks would consider referral to PT and/or sports med.  Chronic Problems Addressed Today: Dyshidrotic eczema Stable.  Continue prednisone.  Moderate asthma without complication No recent flares.  Continue prednisone and albuterol as needed.     Subjective:  HPI:  See A/p for status of chronic conditions.  Patient injured right hamstring 2 weeks ago while moving camping gear. Had immediate pain and bruising to the area. Initially had a lot of pain with walking. Has been using diclofenac with relief of symptoms.         Objective:  Physical Exam: BP (!) 143/79   Pulse 76   Temp 98.2 F (36.8 C) (Temporal)   Ht 5\' 11"  (1.803 m)   Wt 246 lb 6.4 oz (111.8 kg)   SpO2 96%   BMI 34.37 kg/m   Gen: No acute distress, resting comfortably Neuro: Grossly normal, moves all extremities MSK: Bruising to right posterior hamstring.  Tender to palpation.  Normal motor strength.  Neurovascular intact distally. Psych: Normal affect and thought content      Tevis Conger M. Jerline Pain, MD 03/25/2020 9:27 AM

## 2020-03-25 NOTE — Patient Instructions (Addendum)
It was very nice to see you today!  Please work on the exercises. I will refill your diclofenac.  Let me know if it does not continue to improve over the few weeks.  Take care, Dr Jerline Pain  Please try these tips to maintain a healthy lifestyle:   Eat at least 3 REAL meals and 1-2 snacks per day.  Aim for no more than 5 hours between eating.  If you eat breakfast, please do so within one hour of getting up.    Each meal should contain half fruits/vegetables, one quarter protein, and one quarter carbs (no bigger than a computer mouse)   Cut down on sweet beverages. This includes juice, soda, and sweet tea.     Drink at least 1 glass of water with each meal and aim for at least 8 glasses per day   Exercise at least 150 minutes every week.

## 2020-03-25 NOTE — Assessment & Plan Note (Signed)
No recent flares.  Continue prednisone and albuterol as needed.

## 2020-06-05 ENCOUNTER — Other Ambulatory Visit: Payer: Self-pay | Admitting: Family Medicine

## 2020-06-07 NOTE — Telephone Encounter (Signed)
Pt requesting refills.

## 2020-06-13 ENCOUNTER — Telehealth: Payer: Self-pay | Admitting: Family Medicine

## 2020-06-13 NOTE — Telephone Encounter (Signed)
Left message for patient to call back and schedule Medicare Annual Wellness Visit (AWV) either virtually OR in office.   Last AWV 05/16/19; please schedule at anytime with [facility]Nurse Health Advisor at  [place].  This should be a 45 minute visit.   

## 2020-07-31 ENCOUNTER — Telehealth: Payer: Self-pay

## 2020-07-31 ENCOUNTER — Other Ambulatory Visit: Payer: Self-pay | Admitting: *Deleted

## 2020-07-31 ENCOUNTER — Telehealth (INDEPENDENT_AMBULATORY_CARE_PROVIDER_SITE_OTHER): Payer: PPO | Admitting: Physician Assistant

## 2020-07-31 DIAGNOSIS — J45901 Unspecified asthma with (acute) exacerbation: Secondary | ICD-10-CM | POA: Diagnosis not present

## 2020-07-31 MED ORDER — BENZONATATE 200 MG PO CAPS: 200.0000 mg | ORAL_CAPSULE | Freq: Two times a day (BID) | ORAL | 0 refills | Status: DC | PRN

## 2020-07-31 MED ORDER — HYDROCODONE-HOMATROPINE 5-1.5 MG/5ML PO SYRP: 5.0000 mL | ORAL_SOLUTION | Freq: Four times a day (QID) | ORAL | 0 refills | Status: AC | PRN

## 2020-07-31 NOTE — Telephone Encounter (Signed)
Scheduled pt for this afternoon with Alyssa Allwardt.

## 2020-07-31 NOTE — Telephone Encounter (Signed)
Patient stated tessalon does not work for him  If possible to get Rx chlorpheniramine-HYDROcodone (TUSSIONEX PENNKINETIC ER) 10-8 MG/5ML [name]  It work in the past for him  Please advise

## 2020-07-31 NOTE — Telephone Encounter (Signed)
Patient notified, call transfer to front office for appointment

## 2020-07-31 NOTE — Telephone Encounter (Signed)
Pt called stating he was working out in the yard yesterday and his allergies are starting to bother him. Pt states he is experiencing some wheezing/coughing. Pt asked if Dr. Jerline Pain could send in some cough medicine for him. Please advise.

## 2020-07-31 NOTE — Telephone Encounter (Signed)
Please advise  Last seen on 02/2020

## 2020-07-31 NOTE — Progress Notes (Signed)
Virtual Visit via Video Note  I connected with [name] on 07/31/20 at  4:00 PM EDT by a video enabled telemedicine application and verified that I am speaking with the correct person using two identifiers.  Location: Patient: home Provider: Therapist, music at [employer]  I discussed the limitations of evaluation and management by telemedicine and the availability of in person appointments. The patient expressed understanding and agreed to proceed.   Only the patient and myself were present for today's video call.   Video connection was lost at >50% of the duration of the visit, at which time the remainder of the visit was completed via audio only.  History of Present Illness: Congestion in chest and nasal drainage x 2-3 days. Overall feels good except for those symptoms. Says this happens every year after working outside. Hx of asthma. Ellis has been using his rescue inhaler. Requesting Hycodan syrup. No sick contacts. No other symptoms.    Observations/Objective:   Gen: Awake, alert, no acute distress Resp: Breathing is even and non-labored; active cough  Psych: calm/pleasant demeanor Neuro: Alert and Oriented x 3, + facial symmetry, speech is clear.   Assessment and Plan: 1. Asthma with acute exacerbation, unspecified asthma severity, unspecified whether persistent Sounds like usual asthma / seasonal cough flare-up for patient. Ellis declines tessalon perles or steroids & insists that Hycodan syrup works best. PDMP reviewed & no red flags, Rx sent. Risks vs benefits discussed. Ellis will be seen in office if worse or no improvement.   Follow Up Instructions:    I discussed the assessment and treatment plan with the patient. The patient was provided an opportunity to ask questions and all were answered. The patient agreed with the plan and demonstrated an understanding of the instructions.   The patient was advised to call back or seek an in-person evaluation if the  symptoms worsen or if the condition fails to improve as anticipated.  Total time on call: 7 minutes, 31 seconds  [name] M Laconda Basich, PA-C

## 2020-07-31 NOTE — Telephone Encounter (Signed)
Ok to send in tessalon 200mg  bid dispense #30.  He needs OV if not improving.  [name]. Jerline Pain, MD 07/31/2020 10:35 AM

## 2020-07-31 NOTE — Telephone Encounter (Signed)
I can't send in controlled substances without an office visit.  [name]. Jerline Pain, MD 07/31/2020 11:18 AM

## 2020-08-06 ENCOUNTER — Telehealth: Payer: Self-pay

## 2020-08-06 DIAGNOSIS — J984 Other disorders of lung: Secondary | ICD-10-CM | POA: Diagnosis not present

## 2020-08-06 DIAGNOSIS — J168 Pneumonia due to other specified infectious organisms: Secondary | ICD-10-CM | POA: Diagnosis not present

## 2020-08-06 DIAGNOSIS — Z91018 Allergy to other foods: Secondary | ICD-10-CM | POA: Diagnosis not present

## 2020-08-06 DIAGNOSIS — J189 Pneumonia, unspecified organism: Secondary | ICD-10-CM | POA: Diagnosis not present

## 2020-08-06 DIAGNOSIS — I517 Cardiomegaly: Secondary | ICD-10-CM | POA: Diagnosis not present

## 2020-08-06 DIAGNOSIS — Z881 Allergy status to other antibiotic agents status: Secondary | ICD-10-CM | POA: Diagnosis not present

## 2020-08-06 DIAGNOSIS — J441 Chronic obstructive pulmonary disease with (acute) exacerbation: Secondary | ICD-10-CM | POA: Diagnosis not present

## 2020-08-06 DIAGNOSIS — Z79899 Other long term (current) drug therapy: Secondary | ICD-10-CM | POA: Diagnosis not present

## 2020-08-06 NOTE — Telephone Encounter (Signed)
Patient requesting medication

## 2020-08-06 NOTE — Telephone Encounter (Signed)
Patient needs to follow up with PCP for Cough.Please call for further symptoms or schedule appt with PCP No refills will be provided

## 2020-08-06 NOTE — Telephone Encounter (Signed)
Patient is  Scheduled Thursday for virtual and states he needs medication, he states he is unable to sleep at night due to coughing and the medication is the only thing that helps.

## 2020-08-06 NOTE — Telephone Encounter (Signed)
..   LAST APPOINTMENT DATE: 07/31/2020   NEXT APPOINTMENT DATE:@Visit  date not found  MEDICATION:HYDROcodone (TUSSIONEX PENNKINETIC ER) 10-8 MG/5ML SUER   PHARMACY:[id number] - [place] [address] AT [address]

## 2020-08-08 ENCOUNTER — Telehealth: Payer: PPO | Admitting: Family Medicine

## 2020-08-08 DIAGNOSIS — J441 Chronic obstructive pulmonary disease with (acute) exacerbation: Secondary | ICD-10-CM | POA: Diagnosis not present

## 2020-08-08 NOTE — Telephone Encounter (Signed)
Patient cancelled appointment for today.

## 2020-09-19 DIAGNOSIS — Z96651 Presence of right artificial knee joint: Secondary | ICD-10-CM | POA: Diagnosis not present

## 2020-09-25 DIAGNOSIS — R262 Difficulty in walking, not elsewhere classified: Secondary | ICD-10-CM | POA: Diagnosis not present

## 2020-09-25 DIAGNOSIS — R600 Localized edema: Secondary | ICD-10-CM | POA: Diagnosis not present

## 2020-09-25 DIAGNOSIS — R531 Weakness: Secondary | ICD-10-CM | POA: Diagnosis not present

## 2020-09-25 DIAGNOSIS — Z96651 Presence of right artificial knee joint: Secondary | ICD-10-CM | POA: Diagnosis not present

## 2020-09-25 DIAGNOSIS — M25561 Pain in right knee: Secondary | ICD-10-CM | POA: Diagnosis not present

## 2020-09-25 DIAGNOSIS — M25661 Stiffness of right knee, not elsewhere classified: Secondary | ICD-10-CM | POA: Diagnosis not present

## 2020-10-01 DIAGNOSIS — R262 Difficulty in walking, not elsewhere classified: Secondary | ICD-10-CM | POA: Diagnosis not present

## 2020-10-01 DIAGNOSIS — M25661 Stiffness of right knee, not elsewhere classified: Secondary | ICD-10-CM | POA: Diagnosis not present

## 2020-10-01 DIAGNOSIS — R531 Weakness: Secondary | ICD-10-CM | POA: Diagnosis not present

## 2020-10-01 DIAGNOSIS — M25561 Pain in right knee: Secondary | ICD-10-CM | POA: Diagnosis not present

## 2020-10-01 DIAGNOSIS — Z96651 Presence of right artificial knee joint: Secondary | ICD-10-CM | POA: Diagnosis not present

## 2020-10-01 DIAGNOSIS — R600 Localized edema: Secondary | ICD-10-CM | POA: Diagnosis not present

## 2020-10-03 DIAGNOSIS — M25561 Pain in right knee: Secondary | ICD-10-CM | POA: Diagnosis not present

## 2020-10-03 DIAGNOSIS — Z96651 Presence of right artificial knee joint: Secondary | ICD-10-CM | POA: Diagnosis not present

## 2020-10-03 DIAGNOSIS — R600 Localized edema: Secondary | ICD-10-CM | POA: Diagnosis not present

## 2020-10-03 DIAGNOSIS — R531 Weakness: Secondary | ICD-10-CM | POA: Diagnosis not present

## 2020-10-03 DIAGNOSIS — M25661 Stiffness of right knee, not elsewhere classified: Secondary | ICD-10-CM | POA: Diagnosis not present

## 2020-10-03 DIAGNOSIS — R262 Difficulty in walking, not elsewhere classified: Secondary | ICD-10-CM | POA: Diagnosis not present

## 2020-10-08 DIAGNOSIS — R262 Difficulty in walking, not elsewhere classified: Secondary | ICD-10-CM | POA: Diagnosis not present

## 2020-10-08 DIAGNOSIS — R531 Weakness: Secondary | ICD-10-CM | POA: Diagnosis not present

## 2020-10-08 DIAGNOSIS — Z96651 Presence of right artificial knee joint: Secondary | ICD-10-CM | POA: Diagnosis not present

## 2020-10-08 DIAGNOSIS — M25661 Stiffness of right knee, not elsewhere classified: Secondary | ICD-10-CM | POA: Diagnosis not present

## 2020-10-08 DIAGNOSIS — M25561 Pain in right knee: Secondary | ICD-10-CM | POA: Diagnosis not present

## 2020-10-08 DIAGNOSIS — R600 Localized edema: Secondary | ICD-10-CM | POA: Diagnosis not present

## 2020-10-10 DIAGNOSIS — Z96651 Presence of right artificial knee joint: Secondary | ICD-10-CM | POA: Diagnosis not present

## 2020-10-10 DIAGNOSIS — R262 Difficulty in walking, not elsewhere classified: Secondary | ICD-10-CM | POA: Diagnosis not present

## 2020-10-10 DIAGNOSIS — M25561 Pain in right knee: Secondary | ICD-10-CM | POA: Diagnosis not present

## 2020-10-10 DIAGNOSIS — M25661 Stiffness of right knee, not elsewhere classified: Secondary | ICD-10-CM | POA: Diagnosis not present

## 2020-10-10 DIAGNOSIS — R600 Localized edema: Secondary | ICD-10-CM | POA: Diagnosis not present

## 2020-10-10 DIAGNOSIS — R531 Weakness: Secondary | ICD-10-CM | POA: Diagnosis not present

## 2020-10-15 DIAGNOSIS — Z96651 Presence of right artificial knee joint: Secondary | ICD-10-CM | POA: Diagnosis not present

## 2020-10-15 DIAGNOSIS — R531 Weakness: Secondary | ICD-10-CM | POA: Diagnosis not present

## 2020-10-15 DIAGNOSIS — R262 Difficulty in walking, not elsewhere classified: Secondary | ICD-10-CM | POA: Diagnosis not present

## 2020-10-15 DIAGNOSIS — M25561 Pain in right knee: Secondary | ICD-10-CM | POA: Diagnosis not present

## 2020-10-15 DIAGNOSIS — R600 Localized edema: Secondary | ICD-10-CM | POA: Diagnosis not present

## 2020-10-15 DIAGNOSIS — M25661 Stiffness of right knee, not elsewhere classified: Secondary | ICD-10-CM | POA: Diagnosis not present

## 2020-10-18 DIAGNOSIS — R600 Localized edema: Secondary | ICD-10-CM | POA: Diagnosis not present

## 2020-10-18 DIAGNOSIS — M25561 Pain in right knee: Secondary | ICD-10-CM | POA: Diagnosis not present

## 2020-10-18 DIAGNOSIS — Z96651 Presence of right artificial knee joint: Secondary | ICD-10-CM | POA: Diagnosis not present

## 2020-10-18 DIAGNOSIS — M25661 Stiffness of right knee, not elsewhere classified: Secondary | ICD-10-CM | POA: Diagnosis not present

## 2020-10-18 DIAGNOSIS — R262 Difficulty in walking, not elsewhere classified: Secondary | ICD-10-CM | POA: Diagnosis not present

## 2020-10-18 DIAGNOSIS — R531 Weakness: Secondary | ICD-10-CM | POA: Diagnosis not present

## 2020-10-22 ENCOUNTER — Other Ambulatory Visit: Payer: Self-pay | Admitting: Family Medicine

## 2020-10-22 DIAGNOSIS — R600 Localized edema: Secondary | ICD-10-CM | POA: Diagnosis not present

## 2020-10-22 DIAGNOSIS — R531 Weakness: Secondary | ICD-10-CM | POA: Diagnosis not present

## 2020-10-22 DIAGNOSIS — R262 Difficulty in walking, not elsewhere classified: Secondary | ICD-10-CM | POA: Diagnosis not present

## 2020-10-22 DIAGNOSIS — M25561 Pain in right knee: Secondary | ICD-10-CM | POA: Diagnosis not present

## 2020-10-22 DIAGNOSIS — M25661 Stiffness of right knee, not elsewhere classified: Secondary | ICD-10-CM | POA: Diagnosis not present

## 2020-10-22 DIAGNOSIS — Z96651 Presence of right artificial knee joint: Secondary | ICD-10-CM | POA: Diagnosis not present

## 2020-10-25 DIAGNOSIS — M25561 Pain in right knee: Secondary | ICD-10-CM | POA: Diagnosis not present

## 2020-10-25 DIAGNOSIS — Z96651 Presence of right artificial knee joint: Secondary | ICD-10-CM | POA: Diagnosis not present

## 2020-10-25 DIAGNOSIS — M25661 Stiffness of right knee, not elsewhere classified: Secondary | ICD-10-CM | POA: Diagnosis not present

## 2020-10-25 DIAGNOSIS — R600 Localized edema: Secondary | ICD-10-CM | POA: Diagnosis not present

## 2020-10-25 DIAGNOSIS — R262 Difficulty in walking, not elsewhere classified: Secondary | ICD-10-CM | POA: Diagnosis not present

## 2020-10-25 DIAGNOSIS — R531 Weakness: Secondary | ICD-10-CM | POA: Diagnosis not present

## 2020-11-05 DIAGNOSIS — Z96651 Presence of right artificial knee joint: Secondary | ICD-10-CM | POA: Diagnosis not present

## 2020-11-05 DIAGNOSIS — R262 Difficulty in walking, not elsewhere classified: Secondary | ICD-10-CM | POA: Diagnosis not present

## 2020-11-05 DIAGNOSIS — M25661 Stiffness of right knee, not elsewhere classified: Secondary | ICD-10-CM | POA: Diagnosis not present

## 2020-11-05 DIAGNOSIS — R600 Localized edema: Secondary | ICD-10-CM | POA: Diagnosis not present

## 2020-11-05 DIAGNOSIS — M25561 Pain in right knee: Secondary | ICD-10-CM | POA: Diagnosis not present

## 2020-11-05 DIAGNOSIS — R531 Weakness: Secondary | ICD-10-CM | POA: Diagnosis not present

## 2020-11-07 DIAGNOSIS — Z96651 Presence of right artificial knee joint: Secondary | ICD-10-CM | POA: Diagnosis not present

## 2020-11-12 DIAGNOSIS — B353 Tinea pedis: Secondary | ICD-10-CM | POA: Diagnosis not present

## 2020-11-12 DIAGNOSIS — B354 Tinea corporis: Secondary | ICD-10-CM | POA: Diagnosis not present

## 2020-11-26 NOTE — H&P (Signed)
ADMISSION H&P  Subjective:  Chief Complaint: Right knee instability  HPI: [name], 70 y.o. male presents for pre-operative visit in preparation for their right total knee polyethylene revision, which is scheduled on 12/11/2020 with Dr. Wynelle Link at [facility] The patient has had symptoms in the right knee including pain and instability which has impacted their quality of life and ability to do activities of daily living. The patient currently has a diagnosis of unstable right total knee arthroplasty, and has failed conservative treatments including activity modification and formalized physical therapy. The patient denies an active infection.  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   NOISE-INDUCED HEARING LOSS 12/17/2008   Dyshidrotic eczema 12/08/2007   NEPHROLITHIASIS, HX OF 11/10/2007   Allergic rhinitis 06/24/2007    Past Medical History:  Diagnosis Date   Anxiety    Arthritis    Asthma    At risk for sleep apnea    STOP-BANG = 4  SENT TO PCP 05-24-2013   Eczema    Edema of foot    Finger wound, simple, open    left middle index finger - stitches and dressing occured on 09/22/2016- followed by Dr Daws at [facility]    H/O hiatal hernia    History of kidney stones    HOH (hard of hearing)    Hyperlipidemia    Influenza A    Influenza B    symptoms started 05-16-2013/  dx 05-17-2013 by pcp   Left ankle injury    twisted left ankle    Left ureteral calculus    Pneumonia    hx of    Rash    right leg- calf -    Wears dentures    bottom    Past Surgical History:  Procedure Laterality Date   ANTERIOR CERVICAL  DECOMP/DISCECTOMY FUSION  01-27-2007   C6  --- C7   COLONOSCOPY     CYSTOSCOPY WITH URETEROSCOPY Left 05/26/2013   Procedure: CYSTOSCOPY WITH URETEROSCOPY, URETHERAL DILATATION,LEFT RETROGRADE, LASER LITHOTRIPSY, STENT PLACMENT, LEFT URETEROSCOPY;  Surgeon: Ailene Rud, MD;  Location: [place];  Service: Urology;  Laterality: Left;   FINGER FRACTURE SURGERY  AS CHILD   HOLMIUM LASER APPLICATION Left [id number]   Procedure: HOLMIUM LASER APPLICATION;  Surgeon: Ailene Rud, MD;  Location: [facility];  Service: Urology;  Laterality: Left;   LIPOMA EXCISION  05/17/2012   Procedure: EXCISION LIPOMA;  Surgeon: Gayland Curry, MD,FACS;  Location: [place];  Service: General;  Laterality: Left;  incisional biopsy of left shoulder soft tissue mass   NASAL FRACTURE SURGERY  AS CHILD   ORIF ULNAR FRACTURE Right AS CHILD   ROTATOR CUFF REPAIR     right shoulder June2018 by [place] Right 12/05/2018   Procedure: LEFT ARTHROSCOPY, CHONDROPLASTY;  Surgeon: Gaynelle Arabian, MD;  Location: [facility];  Service: Orthopedics;  Laterality: Right;  60mn    Prior to Admission medications   Medication Sig Start Date End Date Taking? Authorizing Provider  albuterol (PROAIR HFA) 108 (90 Base) MCG/ACT inhaler Inhale 2 puffs into the lungs every 6 (six)  hours as needed for wheezing or shortness of breath. 10/10/19   Vivi Barrack, MD  Ascorbic Acid (VITAMIN C) 1000 MG tablet Take 1,000 mg by mouth 2 (two) times daily.    [provider]  Cyanocobalamin (VITAMIN B-12) 5000 MCG TBDP Take 5,000 mcg by mouth daily.    [provider]  diclofenac Sodium (VOLTAREN) 1 % GEL Apply 4 g topically 4 (four) times daily as needed. 03/25/20   Vivi Barrack, MD  predniSONE (DELTASONE) 10 MG tablet USE AS INSTRUCTED BUT FOR FIRST 2 WEEKS- TAKE 2 TABS FOR 3 DAYS, THEN 1 TAB FOR 3 DAYS, THEN 1/2 TAB  FOR 3 DAYS, THEN 1/2 TAB EVERY OTHER DAY FOR 6 DAYS. 10/22/20   Vivi Barrack, MD  tadalafil (CIALIS) 10 MG tablet Take 1 tablet (10 mg total) by mouth daily as needed for erectile dysfunction. 10/10/19   Vivi Barrack, MD  triamcinolone ointment (KENALOG) 0.5 % APPLY TO AFFECTED AREA TWICE A DAY 06/07/20   Vivi Barrack, MD    Allergies  Allergen Reactions   Sesame Oil Itching and Swelling   Tobramycin Itching and Swelling    Used in an eye oint-reaction    Social History   Socioeconomic History   Marital status: Married    Spouse name: Not on file   Number of children: 2   Years of education: Not on file   Highest education level: Not on file  Occupational History   Occupation: Hospital doctor  Tobacco Use   Smoking status: Never   Smokeless tobacco: Never  Vaping Use   Vaping Use: Never used  Substance and Sexual Activity   Alcohol use: Yes    Alcohol/week: 7.0 standard drinks    Types: 7 Cans of beer per week    Comment: beer- 2 beers day sometimes    Drug use: No   Sexual activity: Not on file  Other Topics Concern   Not on file  Social History Narrative   New granddaughter born 28   Social Determinants of Health   Financial Resource Strain: Not on file  Food Insecurity: Not on file  Transportation Needs: Not on file  Physical Activity: Not on file  Stress: Not on file  Social Connections: Not on file  Intimate Partner Violence: Not on file    Tobacco Use: Low Risk    Smoking Tobacco Use: Never   Smokeless Tobacco Use: Never   Social History   Substance and Sexual Activity  Alcohol Use Yes   Alcohol/week: 7.0 standard drinks   Types: 7 Cans of beer per week   Comment: beer- 2 beers day sometimes     Family History  Problem Relation Age of Onset   Arthritis Other    Hypertension Other    Osteoporosis Other    Heart disease Other    Lung disease Other    Prostate cancer Father    Heart disease Sister    Colon cancer Neg Hx     Colon polyps Neg Hx    Esophageal cancer Neg Hx    Kidney disease Neg Hx    Rectal cancer Neg Hx    Stomach cancer Neg Hx     Review of Systems  Constitutional:  Negative for chills and fever.  HENT:  Negative for congestion, sore throat and tinnitus.   Eyes:  Negative for double vision, photophobia and pain.  Respiratory:  Negative for cough, shortness of breath and wheezing.   Cardiovascular:  Negative for chest pain, palpitations and orthopnea.  Gastrointestinal:  Negative for heartburn, nausea and vomiting.  Genitourinary:  Negative for dysuria, frequency and urgency.  Musculoskeletal:  Positive for joint pain.  Neurological:  Negative for dizziness, weakness and headaches.   Objective:  Physical Exam: Well nourished and well developed.  General: Alert and oriented x3, cooperative and pleasant, no acute distress.  Head: normocephalic, atraumatic, neck supple.  Eyes: EOMI.  Respiratory: breath sounds clear in all fields, no wheezing, rales, or rhonchi. Cardiovascular: Regular rate and rhythm, no murmurs, gallops or rubs.  Abdomen: non-tender to palpation and soft, normoactive bowel sounds. Musculoskeletal:  Right knee exam:  There is no swelling. No effusion.  Range of motion: 3 to 125 degrees.  Some varus/valgus play in extension as well as AP play in flexion.  Calves soft and nontender. Motor function intact in LE. Strength 5/5 LE bilaterally. Neuro: Distal pulses 2+. Sensation to light touch intact in LE.  Imaging Review Radiographs: AP and lateral XR of the right knee from 01/2020 shows the prosthesis in good position with no periprosthetic abnormalities.   Assessment/Plan:  Unstable right total knee arthroplasty  The risks and benefits of total knee arthroplasty revision were presented and reviewed. The risks due to infection, stiffness, thromboembolic complications and other imponderables were discussed. The patient acknowledged the explanation, agreed to proceed  with the plan and consent was signed. Patient is being admitted for inpatient treatment for surgery, pain control, PT, OT, prophylactic antibiotics, VTE prophylaxis, progressive ambulation and ADLs and discharge planning. The patient is planning to be discharged  home .  Therapy Plans: Outpatient therapy at [facility] Disposition: Home with wife Planned DVT Prophylaxis: Aspirin 325 mg BID DME Needed: None PCP: Dimas Chyle, MD (appt 8/3) TXA: IV Allergies: Tobramycin (itching) Anesthesia Concerns: None BMI: 35.5 Last HgbA1c: Not diabetic  Pharmacy: [facility])  Other:  - SDD vs 23 hour observation - Taking clearance form to Dr. Jerline Pain on 8/3  - Patient was instructed on what medications to stop prior to surgery. - Follow-up visit in 2 weeks with Dr. Wynelle Link - Begin physical therapy following surgery - Pre-operative lab work as pre-surgical testing - Prescriptions will be provided in hospital at time of discharge  Theresa Duty, PA-C Orthopedic Surgery [facility] [place]

## 2020-11-27 ENCOUNTER — Encounter: Payer: Self-pay | Admitting: Family Medicine

## 2020-11-27 ENCOUNTER — Ambulatory Visit (INDEPENDENT_AMBULATORY_CARE_PROVIDER_SITE_OTHER): Payer: PPO | Admitting: Family Medicine

## 2020-11-27 ENCOUNTER — Other Ambulatory Visit: Payer: Self-pay

## 2020-11-27 VITALS — BP 155/80 | HR 84 | Temp 98.2°F | Ht 71.0 in | Wt 241.8 lb

## 2020-11-27 DIAGNOSIS — M1710 Unilateral primary osteoarthritis, unspecified knee: Secondary | ICD-10-CM | POA: Diagnosis not present

## 2020-11-27 DIAGNOSIS — M179 Osteoarthritis of knee, unspecified: Secondary | ICD-10-CM

## 2020-11-27 DIAGNOSIS — L301 Dyshidrosis [pompholyx]: Secondary | ICD-10-CM

## 2020-11-27 DIAGNOSIS — J45909 Unspecified asthma, uncomplicated: Secondary | ICD-10-CM | POA: Diagnosis not present

## 2020-11-27 DIAGNOSIS — Z01818 Encounter for other preprocedural examination: Secondary | ICD-10-CM | POA: Diagnosis not present

## 2020-11-27 LAB — CBC
HCT: 46.2 % (ref 39.0–52.0)
Hemoglobin: 15.8 g/dL (ref 13.0–17.0)
MCHC: 34.1 g/dL (ref 30.0–36.0)
MCV: 99.2 fl (ref 78.0–100.0)
Platelets: 200 10*3/uL (ref 150.0–400.0)
RBC: 4.66 Mil/uL (ref 4.22–5.81)
RDW: 13 % (ref 11.5–15.5)
WBC: 8.5 10*3/uL (ref 4.0–10.5)

## 2020-11-27 LAB — URINALYSIS, ROUTINE W REFLEX MICROSCOPIC
Bilirubin Urine: NEGATIVE
Hgb urine dipstick: NEGATIVE
Ketones, ur: NEGATIVE
Leukocytes,Ua: NEGATIVE
Nitrite: NEGATIVE
Specific Gravity, Urine: >=1.03 — AB (ref 1.000–1.030)
Total Protein, Urine: NEGATIVE
Urine Glucose: NEGATIVE
Urobilinogen, UA: 1 (ref 0.0–1.0)
pH: 6 (ref 5.0–8.0)

## 2020-11-27 LAB — COMPREHENSIVE METABOLIC PANEL
ALT: 26 U/L (ref 0–53)
AST: 21 U/L (ref 0–37)
Albumin: 4.3 g/dL (ref 3.5–5.2)
Alkaline Phosphatase: 51 U/L (ref 39–117)
BUN: 18 mg/dL (ref 6–23)
CO2: 27 mEq/L (ref 19–32)
Calcium: 9.2 mg/dL (ref 8.4–10.5)
Chloride: 104 mEq/L (ref 96–112)
Creatinine, Ser: 1.11 mg/dL (ref 0.40–1.50)
GFR: 66.77 mL/min (ref >60.00)
Glucose, Bld: 79 mg/dL (ref 70–99)
Potassium: 4.4 mEq/L (ref 3.5–5.1)
Sodium: 139 mEq/L (ref 135–145)
Total Bilirubin: 1.3 mg/dL — ABNORMAL HIGH (ref 0.2–1.2)
Total Protein: 6.8 g/dL (ref 6.0–8.3)

## 2020-11-27 LAB — HEMOGLOBIN A1C: Hgb A1c MFr Bld: 5.5 % (ref 4.6–6.5)

## 2020-11-27 LAB — PROTIME-INR
INR: 1.1 ratio — ABNORMAL HIGH (ref 0.8–1.0)
Prothrombin Time: 11.7 s (ref 9.6–13.1)

## 2020-11-27 NOTE — Assessment & Plan Note (Signed)
Stable.  Follows with orthopedics.  Uses Voltaren gel as needed.

## 2020-11-27 NOTE — Patient Instructions (Signed)
It was very nice to see you today!  Your EKG looks good.  We will check blood work and a urine sample.  I think you will do great with your surgery.  If you need anything else let us know.  Take care, Dr Jerline Pain  PLEASE NOTE:  If you had any lab tests please let us know if you have not heard back within a few days. You may see your results on mychart before we have a chance to review them but we will give you a call once they are reviewed by [place]. If we ordered any referrals today, please let us know if you have not heard from their office within the next week.   Please try these tips to maintain a healthy lifestyle:  Eat at least 3 REAL meals and 1-2 snacks per day.  Aim for no more than 5 hours between eating.  If you eat breakfast, please do so within one hour of getting up.   Each meal should contain half fruits/vegetables, one quarter protein, and one quarter carbs (no bigger than a computer mouse)  Cut down on sweet beverages. This includes juice, soda, and sweet tea.   Drink at least 1 glass of water with each meal and aim for at least 8 glasses per day  Exercise at least 150 minutes every week.

## 2020-11-27 NOTE — Assessment & Plan Note (Signed)
Well-controlled on albuterol as needed.

## 2020-11-27 NOTE — Assessment & Plan Note (Signed)
He has been taking prednisone.  He is having some issues with fragile skin likely due to side effects of the prednisone.  He will try weaning off of prednisone.  He can use topical triamcinolone as needed for flareups.

## 2020-11-27 NOTE — Progress Notes (Signed)
Chief Complaint:  [name] is a 70 y.o. male who presents today for consultation for surgical clearance at the request of Dr. Wynelle Link.  Assessment/Plan:  Encounter For Surgical Clearance Patient overall low risk.  We will check requested labs today including CBC, c-Met, INR, A1c, and urinalysis.  He is EKG stable with no signs of ischemic changes.  We will send a copy of this as well as his surgical clearance form to his surgeon once his lab results are back.  He will be cleared for surgery assuming the above labs are all normal.  Chronic Problems Addressed Today: OA (osteoarthritis) of knee Stable.  Follows with orthopedics.  Uses Voltaren gel as needed.  Dyshidrotic eczema He has been taking prednisone.  He is having some issues with fragile skin likely due to side effects of the prednisone.  He will try weaning off of prednisone.  He can use topical triamcinolone as needed for flareups.  Moderate asthma without complication Well-controlled on albuterol as needed.     Subjective:  HPI:  Patient is here today for preop evaluation at the request of Dr. Wynelle Link for right knee revision.He has a longstanding history of knee osteoarthritis.  He had a right knee arthroplasty in 2020.  He has had issues with continued pain and instability.  He will be undergoing a revision on 12/11/2020.  Occasionally takes prednisone for his dyshidrotic eczema.  Has been using Voltaren for his knee.  This works modestly well.  He has good functional status.  C-reactive with outdoor work.  No reported chest pain or shortness of breath.  No recent illnesses.    ROS: Per HPI, otherwise a complete review of systems was negative.   PMH:  The following were reviewed and entered/updated in epic: Past Medical History:  Diagnosis Date   Anxiety    Arthritis    Asthma    At risk for sleep apnea    STOP-BANG = 4  SENT TO PCP 05-24-2013   Eczema    Edema of foot    Finger wound, simple, open    left  middle index finger - stitches and dressing occured on 09/22/2016- followed by Dr Daws at [facility]    H/O hiatal hernia    History of kidney stones    HOH (hard of hearing)    Hyperlipidemia    Influenza A    Influenza B    symptoms started 05-16-2013/  dx 05-17-2013 by pcp   Left ankle injury    twisted left ankle    Left ureteral calculus    Pneumonia    hx of    Rash    right leg- calf -    Wears dentures    bottom   Patient Active Problem List   Diagnosis Date Noted   Dermatitis 10/10/2019   Erectile dysfunction 10/10/2019   OA (osteoarthritis) of knee 12/05/2018   Moderate asthma without complication 77/41/2878   Eczema of lower leg 08/16/2017   Ganglion cyst of dorsum of left wrist 08/16/2017   Pure hypercholesterolemia 04/05/2013   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   NOISE-INDUCED HEARING LOSS 12/17/2008   Dyshidrotic eczema 12/08/2007   NEPHROLITHIASIS, HX OF 11/10/2007   Allergic rhinitis 06/24/2007   Past Surgical History:  Procedure Laterality Date   ANTERIOR CERVICAL DECOMP/DISCECTOMY FUSION  01-27-2007   C6  --- C7   COLONOSCOPY     CYSTOSCOPY WITH URETEROSCOPY Left 05/26/2013   Procedure: CYSTOSCOPY WITH URETEROSCOPY, URETHERAL DILATATION,LEFT RETROGRADE, LASER  LITHOTRIPSY, STENT PLACMENT, LEFT URETEROSCOPY;  Surgeon: Ailene Rud, MD;  Location: [facility];  Service: Urology;  Laterality: Left;   FINGER FRACTURE SURGERY  AS CHILD   HOLMIUM LASER APPLICATION Left 0/93/8182   Procedure: HOLMIUM LASER APPLICATION;  Surgeon: Ailene Rud, MD;  Location: [facility];  Service: Urology;  Laterality: Left;   LIPOMA EXCISION  05/17/2012   Procedure: EXCISION LIPOMA;  Surgeon: Gayland Curry, MD,FACS;  Location: [place];  Service: General;  Laterality: Left;  incisional biopsy of left shoulder soft tissue mass   NASAL FRACTURE SURGERY  AS CHILD   ORIF ULNAR FRACTURE Right AS CHILD    ROTATOR CUFF REPAIR     right shoulder June2018 by [place] Right 12/05/2018   Procedure: LEFT ARTHROSCOPY, CHONDROPLASTY;  Surgeon: Gaynelle Arabian, MD;  Location: [facility];  Service: Orthopedics;  Laterality: Right;  48mn    Family History  Problem Relation Age of Onset   Arthritis Other    Hypertension Other    Osteoporosis Other    Heart disease Other    Lung disease Other    Prostate cancer Father    Heart disease Sister    Colon cancer Neg Hx    Colon polyps Neg Hx    Esophageal cancer Neg Hx    Kidney disease Neg Hx    Rectal cancer Neg Hx    Stomach cancer Neg Hx     Medications- reviewed and updated Current Outpatient Medications  Medication Sig Dispense Refill   Ascorbic Acid (VITAMIN C) 1000 MG tablet Take 1,000 mg by mouth 2 (two) times daily.     Cyanocobalamin (VITAMIN B-12) 5000 MCG TBDP Take 5,000 mcg by mouth daily.     predniSONE (DELTASONE) 10 MG tablet USE AS INSTRUCTED BUT FOR FIRST 2 WEEKS- TAKE 2 TABS FOR 3 DAYS, THEN 1 TAB FOR 3 DAYS, THEN 1/2 TAB FOR 3 DAYS, THEN 1/2 TAB EVERY OTHER DAY FOR 6 DAYS. 40 tablet 8   triamcinolone ointment (KENALOG) 0.5 % APPLY TO AFFECTED AREA TWICE A DAY 60 g 0   albuterol (PROAIR HFA) 108 (90 Base) MCG/ACT inhaler Inhale 2 puffs into the lungs every 6 (six) hours as needed for wheezing or shortness of breath. (Patient not taking: Reported on 11/27/2020) 18 g 1   diclofenac Sodium (VOLTAREN) 1 % GEL Apply 4 g topically 4 (four) times daily as needed. (Patient not taking: Reported on 11/27/2020) 100 g 3   No current facility-administered medications for this visit.    Allergies-reviewed and updated Allergies  Allergen Reactions   Sesame Oil Itching and Swelling   Tobramycin Itching and Swelling    Used in an eye oint-reaction    Social History   Socioeconomic History   Marital status: Married    Spouse name: Not on file   Number of children: 2   Years of education:  Not on file   Highest education level: Not on file  Occupational History   Occupation: [employer] doctor Tobacco Use   Smoking status: Never   Smokeless tobacco: Never  Vaping Use   Vaping Use: Never used  Substance and Sexual Activity   Alcohol use: Yes    Alcohol/week: 7.0 standard drinks    Types: 7 Cans of beer per week    Comment: beer- 2 beers day sometimes    Drug use: No   Sexual activity: Not on file  Other Topics Concern   Not on file  Social History Narrative   New granddaughter born 2020   Social Determinants of Health   Financial Resource Strain: Not on file  Food Insecurity: Not on file  Transportation Needs: Not on file  Physical Activity: Not on file  Stress: Not on file  Social Connections: Not on file         Objective:  Physical Exam: BP (!) 155/80   Pulse 84   Temp 98.2 F (36.8 C) (Temporal)   Ht '5\' 11"'  (1.803 m)   Wt 241 lb 12.8 oz (109.7 kg)   SpO2 96%   BMI 33.72 kg/m   Gen: NAD, resting comfortably CV: Regular rate and rhythm with no murmurs appreciated Pulm: Normal work of breathing, clear to auscultation bilaterally with no crackles, wheezes, or rhonchi GI: Normal bowel sounds present. Soft, Nontender, Nondistended. MSK: No edema, cyanosis, or clubbing noted Skin: Warm, dry Neuro: Grossly normal, moves all extremities Psych: Normal affect and thought content  EKG: Normal sinus rhythm.  Incomplete right bundle branch block noted.  No ischemic changes.     A copy of this note will be forwarded to the requesting physician.   [name]. Jerline Pain, MD 11/27/2020 11:31 AM

## 2020-11-28 NOTE — Patient Instructions (Signed)
DUE TO COVID-19 ONLY ONE VISITOR IS ALLOWED TO COME WITH YOU AND STAY IN THE WAITING ROOM ONLY DURING PRE OP AND PROCEDURE DAY OF SURGERY. THE 1 VISITOR  MAY VISIT WITH YOU AFTER SURGERY IN YOUR PRIVATE ROOM DURING VISITING HOURS ONLY!  YOU NEED TO HAVE A COVID 19 TEST ON: 12/09/20, THIS TEST MUST BE DONE BEFORE SURGERY,  COVID TESTING SITE: [address]  Phone: [phone]. No appointment needed. It's a drive-up .                [place]   Your procedure is scheduled on: 12/11/20   Report to [facility] Main  Entrance   Report to admitting at : 12:40 PM     Call this number if you have problems the morning of surgery [phone]    Remember: NO SOLID FOOD AFTER MIDNIGHT THE NIGHT PRIOR TO SURGERY. NOTHING BY MOUTH EXCEPT CLEAR LIQUIDS UNTIL: 12:10 PM . PLEASE FINISH ENSURE DRINK PER SURGEON ORDER  WHICH NEEDS TO BE COMPLETED AT : 12:10 PM.   CLEAR LIQUID DIET  Foods Allowed                                                                     Foods Excluded  Coffee and tea, regular and decaf                             liquids that you cannot  Plain Jell-O any favor except red or purple                                           see through such as: Fruit ices (not with fruit pulp)                                     milk, soups, orange juice  Iced Popsicles                                    All solid food Carbonated beverages, regular and diet                                    Cranberry, grape and apple juices Sports drinks like Gatorade Lightly seasoned clear broth or consume(fat free) Sugar, honey syrup  Sample Menu Breakfast                                Lunch                                     Supper Cranberry juice                    Beef broth  Chicken broth Jell-O                                     Grape juice                           Apple juice Coffee or tea                        Jell-O                                       Popsicle                                                Coffee or tea                        Coffee or tea  _____________________________________________________________________    BRUSH YOUR TEETH MORNING OF SURGERY AND RINSE YOUR MOUTH OUT, NO CHEWING GUM CANDY OR MINTS.    Take these medicines the morning of surgery with A SIP OF WATER: N/A                               You may not have any metal on your body including hair pins and              piercings  Do not wear jewelry, lotions, powders or perfumes, deodorant              Men may shave face and neck.   Do not bring valuables to the hospital. [place].  Contacts, dentures or bridgework may not be worn into surgery.  Leave suitcase in the car. After surgery it may be brought to your room.     Patients discharged the day of surgery will not be allowed to drive home. IF YOU ARE HAVING SURGERY AND GOING HOME THE SAME DAY, YOU MUST HAVE AN ADULT TO DRIVE YOU HOME AND BE WITH YOU FOR 24 HOURS. YOU MAY GO HOME BY TAXI OR UBER OR ORTHERWISE, BUT AN ADULT MUST ACCOMPANY YOU HOME AND STAY WITH YOU FOR 24 HOURS.  Name and phone number of your driver:  Special Instructions: N/A              Please read over the following fact sheets you were given: _____________________________________________________________________           [facility] - Preparing for Surgery Before surgery, you can play an important role.  Because skin is not sterile, your skin needs to be as free of germs as possible.  You can reduce the number of germs on your skin by washing with CHG (chlorahexidine gluconate) soap before surgery.  CHG is an antiseptic cleaner which kills germs and bonds with the skin to continue killing germs even after washing. Please DO NOT use if you have an allergy to CHG or antibacterial soaps.  If your skin becomes reddened/irritated stop using the  CHG and inform your nurse when you  arrive at Short Stay. Do not shave (including legs and underarms) for at least 48 hours prior to the first CHG shower.  You may shave your face/neck. Please follow these instructions carefully:  1.  Shower with CHG Soap the night before surgery and the  morning of Surgery.  2.  If you choose to wash your hair, wash your hair first as usual with your  normal  shampoo.  3.  After you shampoo, rinse your hair and body thoroughly to remove the  shampoo.                           4.  Use CHG as you would any other liquid soap.  You can apply chg directly  to the skin and wash                       Gently with a scrungie or clean washcloth.  5.  Apply the CHG Soap to your body ONLY FROM THE NECK DOWN.   Do not use on face/ open                           Wound or open sores. Avoid contact with eyes, ears mouth and genitals (private parts).                       Wash face,  Genitals (private parts) with your normal soap.             6.  Wash thoroughly, paying special attention to the area where your surgery  will be performed.  7.  Thoroughly rinse your body with warm water from the neck down.  8.  DO NOT shower/wash with your normal soap after using and rinsing off  the CHG Soap.                9.  Pat yourself dry with a clean towel.            10.  Wear clean pajamas.            11.  Place clean sheets on your bed the night of your first shower and do not  sleep with pets. Day of Surgery : Do not apply any lotions/deodorants the morning of surgery.  Please wear clean clothes to the hospital/surgery center.  FAILURE TO FOLLOW THESE INSTRUCTIONS MAY RESULT IN THE CANCELLATION OF YOUR SURGERY PATIENT SIGNATURE_________________________________  NURSE SIGNATURE__________________________________  ________________________________________________________________________   [name]  An incentive spirometer is a tool that can help keep your lungs clear and active. This tool measures how  well you are filling your lungs with each breath. Taking long deep breaths may help reverse or decrease the chance of developing breathing (pulmonary) problems (especially infection) following: A long period of time when you are unable to move or be active. BEFORE THE PROCEDURE  If the spirometer includes an indicator to show your best effort, your nurse or respiratory therapist will set it to a desired goal. If possible, sit up straight or lean slightly forward. Try not to slouch. Hold the incentive spirometer in an upright position. INSTRUCTIONS FOR USE  Sit on the edge of your bed if possible, or sit up as far as you can in bed or on a chair. Hold the incentive spirometer in an upright position. Breathe out normally.  Place the mouthpiece in your mouth and seal your lips tightly around it. Breathe in slowly and as deeply as possible, raising the piston or the ball toward the top of the column. Hold your breath for 3-5 seconds or for as long as possible. Allow the piston or ball to fall to the bottom of the column. Remove the mouthpiece from your mouth and breathe out normally. Rest for a few seconds and repeat Steps 1 through 7 at least 10 times every 1-2 hours when you are awake. Take your time and take a few normal breaths between deep breaths. The spirometer may include an indicator to show your best effort. Use the indicator as a goal to work toward during each repetition. After each set of 10 deep breaths, practice coughing to be sure your lungs are clear. If you have an incision (the cut made at the time of surgery), support your incision when coughing by placing a pillow or rolled up towels firmly against it. Once you are able to get out of bed, walk around indoors and cough well. You may stop using the incentive spirometer when instructed by your caregiver.  RISKS AND COMPLICATIONS Take your time so you do not get dizzy or light-headed. If you are in pain, you may need to take or ask  for pain medication before doing incentive spirometry. It is harder to take a deep breath if you are having pain. AFTER USE Rest and breathe slowly and easily. It can be helpful to keep track of a log of your progress. Your caregiver can provide you with a simple table to help with this. If you are using the spirometer at home, follow these instructions: [place] IF:  You are having difficultly using the spirometer. You have trouble using the spirometer as often as instructed. Your pain medication is not giving enough relief while using the spirometer. You develop fever of 100.5 F (38.1 C) or higher. SEEK IMMEDIATE MEDICAL CARE IF:  You cough up bloody sputum that had not been present before. You develop fever of 102 F (38.9 C) or greater. You develop worsening pain at or near the incision site. MAKE SURE YOU:  Understand these instructions. Will watch your condition. Will get help right away if you are not doing well or get worse. Document Released: 08/24/2006 Document Revised: 07/06/2011 Document Reviewed: 10/25/2006 [facility] Patient Information 2014 [place], [place].   ________________________________________________________________________

## 2020-11-29 ENCOUNTER — Encounter (HOSPITAL_COMMUNITY): Payer: Self-pay

## 2020-11-29 ENCOUNTER — Encounter (HOSPITAL_COMMUNITY)
Admission: RE | Admit: 2020-11-29 | Discharge: 2020-11-29 | Disposition: A | Discharge status: Home / Self Care | Payer: PPO | Source: Ambulatory Visit | Attending: Orthopedic Surgery | Admitting: Orthopedic Surgery

## 2020-11-29 ENCOUNTER — Other Ambulatory Visit: Payer: Self-pay

## 2020-11-29 DIAGNOSIS — Z01812 Encounter for preprocedural laboratory examination: Secondary | ICD-10-CM | POA: Insufficient documentation

## 2020-11-29 LAB — SURGICAL PCR SCREEN
MRSA, PCR: NEGATIVE
Staphylococcus aureus: NEGATIVE

## 2020-11-29 NOTE — Progress Notes (Signed)
COVID Vaccine Completed: Yes Date COVID Vaccine completed: 11/16/20 x 4 COVID vaccine manufacturer: Pfizer      PCP - Dr. Vivi Barrack. : Clearance: 11/27/20. EPIC Cardiologist -   Chest x-ray -  EKG - 11/27/20 EPIC Stress Test -  ECHO -  Cardiac Cath -  Pacemaker/ICD device last checked:  Sleep Study -  CPAP -   Fasting Blood Sugar -  Checks Blood Sugar _____ times a day  Blood Thinner Instructions: Aspirin Instructions: Last Dose:  Anesthesia review:   Patient denies shortness of breath, fever, cough and chest pain at PAT appointment   Patient verbalized understanding of instructions that were given to them at the PAT appointment. Patient was also instructed that they will need to review over the PAT instructions again at home before surgery.

## 2020-12-02 NOTE — Progress Notes (Signed)
Please inform patient of the following:  Abs are all stable.  Is okay for him to get his surgery.

## 2020-12-09 ENCOUNTER — Telehealth: Payer: Self-pay | Admitting: *Deleted

## 2020-12-09 ENCOUNTER — Other Ambulatory Visit: Payer: Self-pay | Admitting: Orthopedic Surgery

## 2020-12-09 LAB — SARS CORONAVIRUS 2 (TAT 6-24 HRS): SARS Coronavirus 2: NEGATIVE

## 2020-12-09 NOTE — Telephone Encounter (Signed)
Preoperative risk evaluation form Fax to [phone]

## 2020-12-10 MED ORDER — BUPIVACAINE LIPOSOME 1.3 % IJ SUSP
20.0000 mL | Freq: Once | INTRAMUSCULAR | Status: DC
  Filled 2020-12-10: qty 20

## 2020-12-11 ENCOUNTER — Other Ambulatory Visit: Payer: Self-pay

## 2020-12-11 ENCOUNTER — Encounter (HOSPITAL_COMMUNITY)
Admission: RE | Disposition: A | Discharge status: Home / Self Care | Payer: Self-pay | Source: Ambulatory Visit | Attending: Orthopedic Surgery

## 2020-12-11 ENCOUNTER — Encounter (HOSPITAL_COMMUNITY): Payer: Self-pay | Admitting: Orthopedic Surgery

## 2020-12-11 ENCOUNTER — Inpatient Hospital Stay (HOSPITAL_COMMUNITY)
Admission: RE | Admit: 2020-12-11 | Discharge: 2020-12-12 | DRG: 489 | Disposition: A | Discharge status: Home / Self Care | Payer: PPO | Source: Ambulatory Visit | Attending: Orthopedic Surgery | Admitting: Orthopedic Surgery

## 2020-12-11 ENCOUNTER — Inpatient Hospital Stay (HOSPITAL_COMMUNITY): Payer: PPO | Admitting: Certified Registered Nurse Anesthetist

## 2020-12-11 DIAGNOSIS — N529 Male erectile dysfunction, unspecified: Secondary | ICD-10-CM | POA: Diagnosis not present

## 2020-12-11 DIAGNOSIS — H9190 Unspecified hearing loss, unspecified ear: Secondary | ICD-10-CM | POA: Diagnosis not present

## 2020-12-11 DIAGNOSIS — Y792 Prosthetic and other implants, materials and accessory orthopedic devices associated with adverse incidents: Secondary | ICD-10-CM | POA: Diagnosis present

## 2020-12-11 DIAGNOSIS — T84022A Instability of internal right knee prosthesis, initial encounter: Principal | ICD-10-CM | POA: Diagnosis present

## 2020-12-11 DIAGNOSIS — J4540 Moderate persistent asthma, uncomplicated: Secondary | ICD-10-CM | POA: Diagnosis not present

## 2020-12-11 DIAGNOSIS — E7800 Pure hypercholesterolemia, unspecified: Secondary | ICD-10-CM | POA: Diagnosis present

## 2020-12-11 DIAGNOSIS — J309 Allergic rhinitis, unspecified: Secondary | ICD-10-CM | POA: Diagnosis not present

## 2020-12-11 DIAGNOSIS — Z20822 Contact with and (suspected) exposure to covid-19: Secondary | ICD-10-CM | POA: Diagnosis not present

## 2020-12-11 DIAGNOSIS — T84012A Broken internal right knee prosthesis, initial encounter: Secondary | ICD-10-CM

## 2020-12-11 DIAGNOSIS — Z96651 Presence of right artificial knee joint: Secondary | ICD-10-CM | POA: Diagnosis not present

## 2020-12-11 DIAGNOSIS — G8918 Other acute postprocedural pain: Secondary | ICD-10-CM | POA: Diagnosis not present

## 2020-12-11 DIAGNOSIS — F419 Anxiety disorder, unspecified: Secondary | ICD-10-CM | POA: Diagnosis not present

## 2020-12-11 HISTORY — PX: TOTAL KNEE REVISION: SHX996

## 2020-12-11 LAB — COMPREHENSIVE METABOLIC PANEL
ALT: 20 U/L (ref 0–44)
AST: 17 U/L (ref 15–41)
Albumin: 3.6 g/dL (ref 3.5–5.0)
Alkaline Phosphatase: 44 U/L (ref 38–126)
Anion gap: 5 (ref 5–15)
BUN: 18 mg/dL (ref 8–23)
CO2: 27 mmol/L (ref 22–32)
Calcium: 8.2 mg/dL — ABNORMAL LOW (ref 8.9–10.3)
Chloride: 106 mmol/L (ref 98–111)
Creatinine, Ser: 1.02 mg/dL (ref 0.61–1.24)
GFR, Estimated: >60 mL/min (ref >60)
Glucose, Bld: 93 mg/dL (ref 70–99)
Potassium: 4 mmol/L (ref 3.5–5.1)
Sodium: 138 mmol/L (ref 135–145)
Total Bilirubin: 1 mg/dL (ref 0.3–1.2)
Total Protein: 5.7 g/dL — ABNORMAL LOW (ref 6.5–8.1)

## 2020-12-11 LAB — CBC
HCT: 41.2 % (ref 39.0–52.0)
Hemoglobin: 14.2 g/dL (ref 13.0–17.0)
MCH: 33.9 pg (ref 26.0–34.0)
MCHC: 34.5 g/dL (ref 30.0–36.0)
MCV: 98.3 fL (ref 80.0–100.0)
Platelets: 181 10*3/uL (ref 150–400)
RBC: 4.19 MIL/uL — ABNORMAL LOW (ref 4.22–5.81)
RDW: 11.8 % (ref 11.5–15.5)
WBC: 5.3 10*3/uL (ref 4.0–10.5)
nRBC: 0 % (ref 0.0–0.2)

## 2020-12-11 LAB — PROTIME-INR
INR: 1.2 (ref 0.8–1.2)
Prothrombin Time: 14.8 seconds (ref 11.4–15.2)

## 2020-12-11 SURGERY — TOTAL KNEE REVISION
Anesthesia: Spinal | Site: Knee | Laterality: Right

## 2020-12-11 MED ORDER — LIDOCAINE 2% (20 MG/ML) 5 ML SYRINGE
INTRAMUSCULAR | Status: DC | PRN
  Administered 2020-12-11: 80 mg via INTRAVENOUS

## 2020-12-11 MED ORDER — FLEET ENEMA 7-19 GM/118ML RE ENEM: 1.0000 | ENEMA | Freq: Once | RECTAL | Status: DC | PRN

## 2020-12-11 MED ORDER — TRAMADOL HCL 50 MG PO TABS
50.0000 mg | ORAL_TABLET | Freq: Four times a day (QID) | ORAL | Status: DC | PRN
  Administered 2020-12-12: 100 mg via ORAL
  Filled 2020-12-11: qty 2

## 2020-12-11 MED ORDER — PROPOFOL 10 MG/ML IV BOLUS
INTRAVENOUS | Status: AC
  Filled 2020-12-11: qty 20

## 2020-12-11 MED ORDER — FENTANYL CITRATE (PF) 100 MCG/2ML IJ SOLN
25.0000 ug | INTRAMUSCULAR | Status: DC | PRN
  Administered 2020-12-11 (×2): 50 ug via INTRAVENOUS

## 2020-12-11 MED ORDER — MORPHINE SULFATE (PF) 2 MG/ML IV SOLN
0.5000 mg | INTRAVENOUS | Status: DC | PRN
Start: 2020-12-11 — End: 2020-12-12
  Administered 2020-12-11 – 2020-12-12 (×2): 1 mg via INTRAVENOUS
  Filled 2020-12-11 (×2): qty 1

## 2020-12-11 MED ORDER — ASPIRIN EC 325 MG PO TBEC
325.0000 mg | DELAYED_RELEASE_TABLET | Freq: Two times a day (BID) | ORAL | Status: DC
  Administered 2020-12-12: 325 mg via ORAL
  Filled 2020-12-11: qty 1

## 2020-12-11 MED ORDER — PHENOL 1.4 % MT LIQD: 1.0000 | OROMUCOSAL | Status: DC | PRN

## 2020-12-11 MED ORDER — METOCLOPRAMIDE HCL 5 MG/ML IJ SOLN: 5.0000 mg | Freq: Three times a day (TID) | INTRAMUSCULAR | Status: DC | PRN

## 2020-12-11 MED ORDER — SODIUM CHLORIDE (PF) 0.9 % IJ SOLN
INTRAMUSCULAR | Status: DC | PRN
  Administered 2020-12-11: 60 mL

## 2020-12-11 MED ORDER — CEFAZOLIN SODIUM-DEXTROSE 2-4 GM/100ML-% IV SOLN
2.0000 g | INTRAVENOUS | Status: AC
  Administered 2020-12-11: 2 g via INTRAVENOUS
  Filled 2020-12-11: qty 100

## 2020-12-11 MED ORDER — LACTATED RINGERS IV SOLN
INTRAVENOUS | Status: DC
Start: 2020-12-11 — End: 2020-12-11

## 2020-12-11 MED ORDER — BISACODYL 10 MG RE SUPP: 10.0000 mg | Freq: Every day | RECTAL | Status: DC | PRN

## 2020-12-11 MED ORDER — DEXAMETHASONE SODIUM PHOSPHATE 10 MG/ML IJ SOLN
INTRAMUSCULAR | Status: DC | PRN
  Administered 2020-12-11: 10 mg via INTRAVENOUS

## 2020-12-11 MED ORDER — FENTANYL CITRATE (PF) 100 MCG/2ML IJ SOLN
INTRAMUSCULAR | Status: AC
  Administered 2020-12-11: 50 ug via INTRAVENOUS
  Filled 2020-12-11: qty 2

## 2020-12-11 MED ORDER — HYDROMORPHONE HCL 1 MG/ML IJ SOLN
INTRAMUSCULAR | Status: AC
  Administered 2020-12-11: 0.5 mg via INTRAVENOUS
  Filled 2020-12-11: qty 1

## 2020-12-11 MED ORDER — ORAL CARE MOUTH RINSE: 15.0000 mL | Freq: Once | OROMUCOSAL | Status: AC

## 2020-12-11 MED ORDER — DEXAMETHASONE SODIUM PHOSPHATE 10 MG/ML IJ SOLN: 8.0000 mg | Freq: Once | INTRAMUSCULAR | Status: DC

## 2020-12-11 MED ORDER — DIPHENHYDRAMINE HCL 12.5 MG/5ML PO ELIX: 12.5000 mg | ORAL_SOLUTION | ORAL | Status: DC | PRN

## 2020-12-11 MED ORDER — ALBUTEROL SULFATE (2.5 MG/3ML) 0.083% IN NEBU: 3.0000 mL | INHALATION_SOLUTION | Freq: Four times a day (QID) | RESPIRATORY_TRACT | Status: DC | PRN

## 2020-12-11 MED ORDER — ONDANSETRON HCL 4 MG/2ML IJ SOLN: 4.0000 mg | Freq: Four times a day (QID) | INTRAMUSCULAR | Status: DC | PRN

## 2020-12-11 MED ORDER — OXYCODONE HCL 5 MG PO TABS
5.0000 mg | ORAL_TABLET | ORAL | Status: DC | PRN
  Administered 2020-12-11 – 2020-12-12 (×3): 10 mg via ORAL
  Filled 2020-12-11 (×3): qty 2

## 2020-12-11 MED ORDER — FENTANYL CITRATE (PF) 100 MCG/2ML IJ SOLN
50.0000 ug | INTRAMUSCULAR | Status: DC
  Administered 2020-12-11: 50 ug via INTRAVENOUS
  Filled 2020-12-11: qty 2

## 2020-12-11 MED ORDER — METHOCARBAMOL 1000 MG/10ML IJ SOLN
500.0000 mg | Freq: Four times a day (QID) | INTRAVENOUS | Status: DC | PRN
  Filled 2020-12-11: qty 5

## 2020-12-11 MED ORDER — PROPOFOL 500 MG/50ML IV EMUL
INTRAVENOUS | Status: DC | PRN
  Administered 2020-12-11: 100 ug/kg/min via INTRAVENOUS

## 2020-12-11 MED ORDER — POLYETHYLENE GLYCOL 3350 17 G PO PACK: 17.0000 g | PACK | Freq: Every day | ORAL | Status: DC | PRN

## 2020-12-11 MED ORDER — ACETAMINOPHEN 10 MG/ML IV SOLN
1000.0000 mg | Freq: Four times a day (QID) | INTRAVENOUS | Status: DC
  Administered 2020-12-11: 1000 mg via INTRAVENOUS
  Filled 2020-12-11: qty 100

## 2020-12-11 MED ORDER — HYDROMORPHONE HCL 1 MG/ML IJ SOLN
0.2500 mg | INTRAMUSCULAR | Status: AC
  Administered 2020-12-11: 0.5 mg via INTRAVENOUS

## 2020-12-11 MED ORDER — LACTATED RINGERS IV SOLN: INTRAVENOUS | Status: DC

## 2020-12-11 MED ORDER — BUPIVACAINE IN DEXTROSE 0.75-8.25 % IT SOLN
INTRATHECAL | Status: DC | PRN
  Administered 2020-12-11: 1.6 mL via INTRATHECAL

## 2020-12-11 MED ORDER — ACETAMINOPHEN 500 MG PO TABS
1000.0000 mg | ORAL_TABLET | Freq: Four times a day (QID) | ORAL | Status: DC
  Administered 2020-12-11 – 2020-12-12 (×3): 1000 mg via ORAL
  Filled 2020-12-11 (×3): qty 2

## 2020-12-11 MED ORDER — MIDAZOLAM HCL 2 MG/2ML IJ SOLN
1.0000 mg | INTRAMUSCULAR | Status: DC
  Administered 2020-12-11: 1 mg via INTRAVENOUS
  Filled 2020-12-11: qty 2

## 2020-12-11 MED ORDER — CEFAZOLIN SODIUM-DEXTROSE 2-4 GM/100ML-% IV SOLN
2.0000 g | Freq: Four times a day (QID) | INTRAVENOUS | Status: AC
  Administered 2020-12-11 – 2020-12-12 (×2): 2 g via INTRAVENOUS
  Filled 2020-12-11 (×2): qty 100

## 2020-12-11 MED ORDER — BUPIVACAINE-EPINEPHRINE (PF) 0.25% -1:200000 IJ SOLN
INTRAMUSCULAR | Status: AC
  Filled 2020-12-11: qty 30

## 2020-12-11 MED ORDER — CHLORHEXIDINE GLUCONATE 0.12 % MT SOLN
15.0000 mL | Freq: Once | OROMUCOSAL | Status: AC
  Administered 2020-12-11: 15 mL via OROMUCOSAL

## 2020-12-11 MED ORDER — ONDANSETRON HCL 4 MG/2ML IJ SOLN
INTRAMUSCULAR | Status: DC | PRN
Start: 2020-12-11 — End: 2020-12-11
  Administered 2020-12-11: 4 mg via INTRAVENOUS

## 2020-12-11 MED ORDER — SODIUM CHLORIDE 0.9 % IV SOLN: INTRAVENOUS | Status: DC

## 2020-12-11 MED ORDER — METOCLOPRAMIDE HCL 5 MG PO TABS: 5.0000 mg | ORAL_TABLET | Freq: Three times a day (TID) | ORAL | Status: DC | PRN

## 2020-12-11 MED ORDER — 0.9 % SODIUM CHLORIDE (POUR BTL) OPTIME
TOPICAL | Status: DC | PRN
  Administered 2020-12-11: 1000 mL

## 2020-12-11 MED ORDER — POVIDONE-IODINE 10 % EX SWAB
2.0000 "application " | Freq: Once | CUTANEOUS | Status: AC
  Administered 2020-12-11: 2 via TOPICAL

## 2020-12-11 MED ORDER — ONDANSETRON HCL 4 MG PO TABS: 4.0000 mg | ORAL_TABLET | Freq: Four times a day (QID) | ORAL | Status: DC | PRN

## 2020-12-11 MED ORDER — BUPIVACAINE LIPOSOME 1.3 % IJ SUSP
INTRAMUSCULAR | Status: DC | PRN
  Administered 2020-12-11: 20 mL

## 2020-12-11 MED ORDER — DOCUSATE SODIUM 100 MG PO CAPS
100.0000 mg | ORAL_CAPSULE | Freq: Two times a day (BID) | ORAL | Status: DC
  Administered 2020-12-11 – 2020-12-12 (×2): 100 mg via ORAL
  Filled 2020-12-11 (×2): qty 1

## 2020-12-11 MED ORDER — FENTANYL CITRATE (PF) 100 MCG/2ML IJ SOLN
INTRAMUSCULAR | Status: AC
  Filled 2020-12-11: qty 2

## 2020-12-11 MED ORDER — DEXAMETHASONE SODIUM PHOSPHATE 10 MG/ML IJ SOLN
10.0000 mg | Freq: Once | INTRAMUSCULAR | Status: AC
  Administered 2020-12-12: 10 mg via INTRAVENOUS
  Filled 2020-12-11: qty 1

## 2020-12-11 MED ORDER — MENTHOL 3 MG MT LOZG: 1.0000 | LOZENGE | OROMUCOSAL | Status: DC | PRN

## 2020-12-11 MED ORDER — METHOCARBAMOL 500 MG PO TABS
500.0000 mg | ORAL_TABLET | Freq: Four times a day (QID) | ORAL | Status: DC | PRN
  Administered 2020-12-12: 500 mg via ORAL
  Filled 2020-12-11: qty 1

## 2020-12-11 MED ORDER — TRANEXAMIC ACID-NACL 1000-0.7 MG/100ML-% IV SOLN
1000.0000 mg | INTRAVENOUS | Status: AC
  Administered 2020-12-11: 1000 mg via INTRAVENOUS
  Filled 2020-12-11: qty 100

## 2020-12-11 MED ORDER — PROPOFOL 10 MG/ML IV BOLUS
INTRAVENOUS | Status: DC | PRN
  Administered 2020-12-11 (×4): 20 mg via INTRAVENOUS

## 2020-12-11 SURGICAL SUPPLY — 59 items
BAG COUNTER SPONGE SURGICOUNT (BAG) IMPLANT
BAG DECANTER FOR FLEXI CONT (MISCELLANEOUS) IMPLANT
BAG ZIPLOCK 12X15 (MISCELLANEOUS) IMPLANT
BANDAGE ESMARK 6X9 LF (GAUZE/BANDAGES/DRESSINGS) ×1 IMPLANT
BLADE SAG 18X100X1.27 (BLADE) ×2 IMPLANT
BLADE SAW SGTL 11.0X1.19X90.0M (BLADE) IMPLANT
BLADE SURG SZ10 CARB STEEL (BLADE) ×4 IMPLANT
BNDG COHESIVE 6X5 TAN ST LF (GAUZE/BANDAGES/DRESSINGS) ×2 IMPLANT
BNDG ELASTIC 6X5.8 VLCR STR LF (GAUZE/BANDAGES/DRESSINGS) ×2 IMPLANT
BNDG ESMARK 6X9 LF (GAUZE/BANDAGES/DRESSINGS) ×2
CLOTH BEACON ORANGE TIMEOUT ST (SAFETY) ×2 IMPLANT
COVER SURGICAL LIGHT HANDLE (MISCELLANEOUS) ×2 IMPLANT
CUFF TOURN SGL QUICK 34 (TOURNIQUET CUFF) ×2
CUFF TRNQT CYL 34X4.125X (TOURNIQUET CUFF) ×1 IMPLANT
DECANTER SPIKE VIAL GLASS SM (MISCELLANEOUS) IMPLANT
DRAPE U-SHAPE 47X51 STRL (DRAPES) ×2 IMPLANT
DRSG ADAPTIC 3X8 NADH LF (GAUZE/BANDAGES/DRESSINGS) ×2 IMPLANT
DRSG AQUACEL AG ADV 3.5X10 (GAUZE/BANDAGES/DRESSINGS) ×2 IMPLANT
DRSG PAD ABDOMINAL 8X10 ST (GAUZE/BANDAGES/DRESSINGS) ×2 IMPLANT
DURAPREP 26ML APPLICATOR (WOUND CARE) ×2 IMPLANT
ELECT REM PT RETURN 15FT ADLT (MISCELLANEOUS) ×2 IMPLANT
EVACUATOR 1/8 PVC DRAIN (DRAIN) ×2 IMPLANT
GAUZE SPONGE 4X4 12PLY STRL (GAUZE/BANDAGES/DRESSINGS) ×2 IMPLANT
GLOVE SRG 8 PF TXTR STRL LF DI (GLOVE) ×1 IMPLANT
GLOVE SURG ENC MOIS LTX SZ6.5 (GLOVE) ×4 IMPLANT
GLOVE SURG ENC MOIS LTX SZ8 (GLOVE) ×4 IMPLANT
GLOVE SURG UNDER POLY LF SZ7 (GLOVE) ×2 IMPLANT
GLOVE SURG UNDER POLY LF SZ8 (GLOVE) ×2
GLOVE SURG UNDER POLY LF SZ8.5 (GLOVE) IMPLANT
GOWN STRL REUS W/TWL LRG LVL3 (GOWN DISPOSABLE) ×4 IMPLANT
HANDPIECE INTERPULSE COAX TIP (DISPOSABLE) ×2
HOLDER FOLEY CATH W/STRAP (MISCELLANEOUS) IMPLANT
IMMOBILIZER KNEE 20 (SOFTGOODS) ×2
IMMOBILIZER KNEE 20 THIGH 36 (SOFTGOODS) ×1 IMPLANT
INSERT TIB ATTUNE RP SZ7X16 (Insert) ×2 IMPLANT
KIT TURNOVER KIT A (KITS) ×2 IMPLANT
MANIFOLD NEPTUNE II (INSTRUMENTS) ×2 IMPLANT
NS IRRIG 1000ML POUR BTL (IV SOLUTION) ×2 IMPLANT
PACK TOTAL KNEE CUSTOM (KITS) ×2 IMPLANT
PADDING CAST COTTON 6X4 STRL (CAST SUPPLIES) ×4 IMPLANT
PENCIL SMOKE EVACUATOR (MISCELLANEOUS) IMPLANT
PROTECTOR NERVE ULNAR (MISCELLANEOUS) ×2 IMPLANT
SET HNDPC FAN SPRY TIP SCT (DISPOSABLE) ×1 IMPLANT
STOCKINETTE 8 INCH (MISCELLANEOUS) ×2 IMPLANT
STRIP CLOSURE SKIN 1/2X4 (GAUZE/BANDAGES/DRESSINGS) ×2 IMPLANT
SUT MNCRL AB 4-0 PS2 18 (SUTURE) ×2 IMPLANT
SUT STRATAFIX 0 PDS 27 VIOLET (SUTURE) ×2
SUT VIC AB 2-0 CT1 27 (SUTURE) ×6
SUT VIC AB 2-0 CT1 TAPERPNT 27 (SUTURE) ×3 IMPLANT
SUTURE STRATFX 0 PDS 27 VIOLET (SUTURE) ×1 IMPLANT
SWAB COLLECTION DEVICE MRSA (MISCELLANEOUS) IMPLANT
SWAB CULTURE ESWAB REG 1ML (MISCELLANEOUS) IMPLANT
SYR 50ML LL SCALE MARK (SYRINGE) ×4 IMPLANT
TOWER CARTRIDGE SMART MIX (DISPOSABLE) IMPLANT
TRAY FOLEY MTR SLVR 16FR STAT (SET/KITS/TRAYS/PACK) ×2 IMPLANT
TUBE KAMVAC SUCTION (TUBING) IMPLANT
TUBE SUCTION HIGH CAP CLEAR NV (SUCTIONS) ×2 IMPLANT
WATER STERILE IRR 1000ML POUR (IV SOLUTION) ×2 IMPLANT
WRAP KNEE MAXI GEL POST OP (GAUZE/BANDAGES/DRESSINGS) IMPLANT

## 2020-12-11 NOTE — Transfer of Care (Signed)
Immediate Anesthesia Transfer of Care Note  Patient: [name]  Procedure(s) Performed: Procedure(s): Right knee polyethylene revision (Right)  Patient Location: PACU  Anesthesia Type:Spinal  Level of Consciousness: awake, alert  and oriented  Airway & Oxygen Therapy: Patient Spontanous Breathing  Post-op Assessment: Report given to RN and Post -op Vital signs reviewed and stable  Post vital signs: Reviewed and stable  Last Vitals:  Vitals:   12/11/20 1304 12/11/20 1309  BP: 140/90 (!) 154/95  Pulse: 62 74  Resp: (!) 26 18  Temp:    SpO2: [id number]    Complications: No apparent anesthesia complications

## 2020-12-11 NOTE — Plan of Care (Signed)
Discussed with patient and family about plan of care for post-op day 0.   Will continue to monitor patient.    [place], Therapist, sports

## 2020-12-11 NOTE — Anesthesia Postprocedure Evaluation (Signed)
Anesthesia Post Note  Patient: [name]  Procedure(s) Performed: Right knee polyethylene revision (Right: Knee)     Patient location during evaluation: PACU Anesthesia Type: Spinal Level of consciousness: awake and alert Pain management: pain level controlled Vital Signs Assessment: post-procedure vital signs reviewed and stable Respiratory status: spontaneous breathing and respiratory function stable Cardiovascular status: blood pressure returned to baseline and stable Postop Assessment: spinal receding Anesthetic complications: no   No notable events documented.  Last Vitals:  Vitals:   12/11/20 1745 12/11/20 1837  BP: (!) 149/99 (!) 158/90  Pulse: 69 (!) 58  Resp: 16 16  Temp:  36.7 C  SpO2: 97% 96%    Last Pain:  Vitals:   12/11/20 1837  TempSrc: Oral  PainSc: 6                  Tiajuana Amass

## 2020-12-11 NOTE — Progress Notes (Signed)
Orthopedic Tech Progress Note Patient Details:  [name] June 05, 1950 [id number]  Patient ID: [name], male   DOB: 20-Jan-1951, 70 y.o.   MRN: [id number]  Kennis Carina 12/11/2020, 3:58 PM Cpm placed on right leg in pacu

## 2020-12-11 NOTE — Interval H&P Note (Signed)
History and Physical Interval Note:  12/11/2020 12:39 PM  [name]  has presented today for surgery, with the diagnosis of unstable right total knee arthroplasty.  The various methods of treatment have been discussed with the patient and family. After consideration of risks, benefits and other options for treatment, the patient has consented to  Procedure(s): Right knee polyethylene revision (Right) as a surgical intervention.  The patient's history has been reviewed, patient examined, no change in status, stable for surgery.  I have reviewed the patient's chart and labs.  Questions were answered to the patient's satisfaction.     Pilar Plate Mazie Fencl

## 2020-12-11 NOTE — Anesthesia Procedure Notes (Signed)
Anesthesia Regional Block: Adductor canal block   Pre-Anesthetic Checklist: , timeout performed,  Correct Patient, Correct Site, Correct Laterality,  Correct Procedure, Correct Position, site marked,  Risks and benefits discussed,  Surgical consent,  Pre-op evaluation,  At surgeon's request and post-op pain management  Laterality: Right  Prep: chloraprep       Needles:  Injection technique: Single-shot  Needle Type: Echogenic Needle     Needle Length: 9cm  Needle Gauge: 21     Additional Needles:   Procedures:,,,, ultrasound used (permanent image in chart),,    Narrative:  Start time: 12/11/2020 1:00 PM End time: 12/11/2020 1:06 PM Injection made incrementally with aspirations every 5 mL.  Performed by: Personally  Anesthesiologist: Suzette Battiest, MD

## 2020-12-11 NOTE — Op Note (Signed)
NAMESAJAN, MALOOF MEDICAL RECORD NO: [id number] ACCOUNT NO: [id number] DATE OF BIRTH: Dec 18, 1950 FACILITY: [facility] LOCATION: [facility] PHYSICIAN: Dione Plover. Shunte Senseney, MD  Operative Report   DATE OF PROCEDURE: 12/11/2020  PREOPERATIVE DIAGNOSIS:  Unstable right total knee arthroplasty.  POSTOPERATIVE DIAGNOSIS:  Unstable right total knee arthroplasty.  PROCEDURE:  Right knee polyethylene revision.  SURGEON:  Dione Plover. Alyze Lauf, MD  ASSISTANT:  Gerrit Halls, PA-C  ANESTHESIA:  Adductor canal block and spinal.  ESTIMATED BLOOD LOSS:  Minimal.  DRAIN:  None.  TOURNIQUET TIME:  21 minutes at 300 mmHg.  COMPLICATIONS:  None.  CONDITION:  Stable to recovery.  BRIEF CLINICAL NOTE:  The patient is a 70 year old male who had a right total knee arthroplasty done several years ago.  He was doing fine and recently started having increasing pain and instability symptoms.  Exam showed that he has developed  significant varus, valgus and anterior, posterior laxity in the knee that was not present initially.  He has had pain related to this.  He has had mechanical symptoms related to this.  He presents now for polyethylene revision.  DESCRIPTION OF PROCEDURE:  After successful administration of adductor canal block and spinal, a tourniquet was placed high on the right thigh.  Right lower extremity was prepped and draped in the usual sterile fashion.  Extremity was wrapped in Esmarch,  tourniquet inflated to 300 mmHg.  Midline incision was made with a 10 blade through the subcutaneous tissue to the level of the extensor mechanism.  A fresh blade was used to make a medial parapatellar arthrotomy.  Soft tissue in the proximal medial  tibia subperiosteally elevated to the joint line with a knife and into the semimembranosus bursa with a Cobb elevator.  Soft tissue laterally was elevated with attention being paid to avoiding the patellar tendon on the tibial tubercle.  I was able to  sublux the patella  laterally.  The knee was flexed 90 degrees.  I was able to remove the polyethylene from the tibial tray.  All the metal parts were in good position and were well fixed.  It was a 12 mm thick insert that was removed.  I placed a trial  16 mm insert in place.  This was a rotating platform posterior-stabilized insert.  Full extension was achieved with excellent varus, valgus and anterior, posterior balance throughout full range of motion.  The trials were removed and the permanent 16 mm  rotating platform posterior-stabilized insert for the tibial tray was placed and the knee reduced.  Again, there was fantastic stability throughout full range of motion.  The wound was copiously irrigated with saline solution, and 20 mL of Exparel mixed  with 60 mL of saline was injected into the subcutaneous tissues, posterior capsule, extensor mechanism, and retinaculum of the extensor side.  The arthrotomy was then closed with a running 0 Stratafix suture.  Flexion against gravity was 130 degrees.   Patella tracked normally.  Tourniquet was released, total time of 21 minutes.  Subcu was closed with interrupted 2-0 Vicryl and subcuticular running 4-0 Monocryl.  The incision was cleaned and dried, and Steri-Strips and a sterile dressing applied.  He  was then awakened and transported to recovery in stable condition.  Note that a surgical assistant was of medical necessity for this procedure to do it in a safe and expeditious manner.  Surgical assistant was necessary for retraction of neurovascular structures during the procedure, also for safe and accurate removal of  the old implant  and safe and accurate placement of the new implant.   ROH D: 12/11/2020 3:13:34 pm T: 12/11/2020 5:32:00 pm  JOB: [id number]

## 2020-12-11 NOTE — Brief Op Note (Signed)
12/11/2020  3:07 PM  PATIENT:  [name]  70 y.o. male  PRE-OPERATIVE DIAGNOSIS:  unstable right total knee arthroplasty  POST-OPERATIVE DIAGNOSIS:  * No post-op diagnosis entered *  PROCEDURE:  Procedure(s): Right knee polyethylene revision (Right)  SURGEON:  Surgeon(s) and Role:    Gaynelle Arabian, MD - Primary  PHYSICIAN ASSISTANT:   ASSISTANTS: Molli Barrows, PA-C   ANESTHESIA:   spinal  EBL:  25 ML   BLOOD ADMINISTERED:none  DRAINS: none   LOCAL MEDICATIONS USED:  OTHER Exparel  COUNTS:  YES  TOURNIQUET:  * Missing tourniquet times found for documented tourniquets in log: [id number] *  DICTATION: .Other Dictation: Dictation Number [id number]  PLAN OF CARE: Admit for overnight observation  PATIENT DISPOSITION:  PACU - hemodynamically stable.

## 2020-12-11 NOTE — Anesthesia Preprocedure Evaluation (Signed)
Anesthesia Evaluation  Patient identified by MRN, date of birth, ID band Patient awake    Reviewed: Allergy & Precautions, NPO status , Patient's Chart, lab work & pertinent test results  Airway Mallampati: II  TM Distance: >3 FB Neck ROM: Full    Dental   Pulmonary asthma ,    breath sounds clear to auscultation       Cardiovascular negative cardio ROS   Rhythm:Regular Rate:Normal     Neuro/Psych negative neurological ROS     GI/Hepatic Neg liver ROS, hiatal hernia,   Endo/Other  negative endocrine ROS  Renal/GU negative Renal ROS     Musculoskeletal  (+) Arthritis ,   Abdominal   Peds  Hematology negative hematology ROS (+)   Anesthesia Other Findings   Reproductive/Obstetrics                             Lab Results  Component Value Date   WBC 8.5 11/27/2020   HGB 15.8 11/27/2020   HCT 46.2 11/27/2020   MCV 99.2 11/27/2020   PLT 200.0 11/27/2020   Lab Results  Component Value Date   CREATININE 1.11 11/27/2020   BUN 18 11/27/2020   NA 139 11/27/2020   K 4.4 11/27/2020   CL 104 11/27/2020   CO2 27 11/27/2020    Anesthesia Physical Anesthesia Plan  ASA: 2  Anesthesia Plan: Spinal   Post-op Pain Management:  Regional for Post-op pain   Induction:   PONV Risk Score and Plan: 1 and Propofol infusion, Ondansetron and Treatment may vary due to age or medical condition  Airway Management Planned: Natural Airway and Simple Face Mask  Additional Equipment:   Intra-op Plan:   Post-operative Plan:   Informed Consent: I have reviewed the patients History and Physical, chart, labs and discussed the procedure including the risks, benefits and alternatives for the proposed anesthesia with the patient or authorized representative who has indicated his/her understanding and acceptance.       Plan Discussed with: CRNA  Anesthesia Plan Comments:        Anesthesia  Quick Evaluation

## 2020-12-11 NOTE — Anesthesia Procedure Notes (Signed)
Spinal  Patient location during procedure: OR Start time: 12/11/2020 2:07 PM End time: 12/11/2020 2:12 PM Reason for block: surgical anesthesia Staffing Performed: anesthesiologist  Anesthesiologist: Suzette Battiest, MD Preanesthetic Checklist Completed: patient identified, IV checked, site marked, risks and benefits discussed, surgical consent, monitors and equipment checked, pre-op evaluation and timeout performed Spinal Block Patient position: sitting Prep: DuraPrep Patient monitoring: heart rate, cardiac monitor, continuous pulse ox and blood pressure Approach: midline Location: L4-5 Injection technique: single-shot Needle Needle type: Pencan  Needle gauge: 24 G Needle length: 9 cm Assessment Sensory level: T4 Events: CSF return

## 2020-12-11 NOTE — Progress Notes (Signed)
AssistedDr. Rob Fitzgerald with right, ultrasound guided, adductor canal block. Side rails up, monitors on throughout procedure. See vital signs in flow sheet. Tolerated Procedure well.  

## 2020-12-11 NOTE — Progress Notes (Signed)
Orthopedic Tech Progress Note Patient Details:  [name] 09-Jan-1951 [id number]  Patient ID: [name], male   DOB: Apr 28, 1950, 70 y.o.   MRN: [id number]  [name] 12/11/2020, 7:54 PM Cpm removed from patient

## 2020-12-11 NOTE — Discharge Instructions (Signed)
 Adam Aluisio, MD Total Joint Specialist [facility] [address] [place], [place] [address] [phone] POSTOPERATIVE DIRECTIONS  Knee Rehabilitation, Guidelines Following Surgery  Results after knee surgery are often greatly improved when you follow the exercise, range of motion and muscle strengthening exercises prescribed by your doctor. Safety measures are also important to protect the knee from further injury. If any of these exercises cause you to have increased pain or swelling in your knee joint, decrease the amount until you are comfortable again and slowly increase them. If you have problems or questions, call your caregiver or physical therapist for advice.   BLOOD CLOT PREVENTION Take a 325 mg Aspirin two times a day for three weeks following surgery. Then take an 81 mg Aspirin once a day for three weeks. Then discontinue Aspirin. You may resume your vitamins/supplements upon discharge from the hospital. Do not take any NSAIDs (Advil, Aleve, Ibuprofen, Meloxicam, etc.) until you have discontinued the 325 mg Aspirin.  HOME CARE INSTRUCTIONS  Remove items at home which could result in a fall. This includes throw rugs or furniture in walking pathways.  ICE to the affected knee as much as tolerated. Icing helps control swelling. If the swelling is well controlled you will be more comfortable and rehab easier. Continue to use ice on the knee for pain and swelling from surgery. You may notice swelling that will progress down to the foot and ankle. This is normal after surgery. Elevate the leg when you are not up walking on it.    Continue to use the breathing machine which will help keep your temperature down. It is common for your temperature to cycle up and down following surgery, especially at night when you are not up moving around and exerting yourself. The breathing machine keeps your lungs expanded and your temperature down. Do not place pillow  under the operative knee, focus on keeping the knee straight while resting  DIET You may resume your previous home diet once you are discharged from the hospital.  DRESSING / WOUND CARE / SHOWERING Keep your bulky bandage on for 2 days. On the third post-operative day you may remove the Ace bandage and gauze. There is a waterproof adhesive bandage on your skin which will stay in place until your first follow-up appointment. Once you remove this you will not need to place another bandage You may begin showering 3 days following surgery, but do not submerge the incision under water.  ACTIVITY For the first 5 days, the key is rest and control of pain and swelling Do your home exercises twice a day starting on post-operative day 3. On the days you go to physical therapy, just do the home exercises once that day. You should rest, ice and elevate the leg for 50 minutes out of every hour. Get up and walk/stretch for 10 minutes per hour. After 5 days you can increase your activity slowly as tolerated. Walk with your walker as instructed. Use the walker until you are comfortable transitioning to a cane. Walk with the cane in the opposite hand of the operative leg. You may discontinue the cane once you are comfortable and walking steadily. Avoid periods of inactivity such as sitting longer than an hour when not asleep. This helps prevent blood clots.  You may discontinue the knee immobilizer once you are able to perform a straight leg raise while lying down. You may resume a sexual relationship in one month or when given the OK by   your doctor.  You may return to work once you are cleared by your doctor.  Do not drive a car for 6 weeks or until released by your surgeon.  Do not drive while taking narcotics.  TED HOSE STOCKINGS Wear the elastic stockings on both legs for three weeks following surgery during the day. You may remove them at night for sleeping.  WEIGHT BEARING Weight bearing as tolerated  with assist device (walker, cane, etc) as directed, use it as long as suggested by your surgeon or therapist, typically at least 4-6 weeks.  POSTOPERATIVE CONSTIPATION PROTOCOL Constipation - defined medically as fewer than three stools per week and severe constipation as less than one stool per week.  One of the most common issues patients have following surgery is constipation.  Even if you have a regular bowel pattern at home, your normal regimen is likely to be disrupted due to multiple reasons following surgery.  Combination of anesthesia, postoperative narcotics, change in appetite and fluid intake all can affect your bowels.  In order to avoid complications following surgery, here are some recommendations in order to help you during your recovery period.  Colace (docusate) - Pick up an over-the-counter form of Colace or another stool softener and take twice a day as long as you are requiring postoperative pain medications.  Take with a full glass of water daily.  If you experience loose stools or diarrhea, hold the colace until you stool forms back up. If your symptoms do not get better within 1 week or if they get worse, check with your doctor. Dulcolax (bisacodyl) - Pick up over-the-counter and take as directed by the product packaging as needed to assist with the movement of your bowels.  Take with a full glass of water.  Use this product as needed if not relieved by Colace only.  MiraLax (polyethylene glycol) - Pick up over-the-counter to have on hand. MiraLax is a solution that will increase the amount of water in your bowels to assist with bowel movements.  Take as directed and can mix with a glass of water, juice, soda, coffee, or tea. Take if you go more than two days without a movement. Do not use MiraLax more than once per day. Call your doctor if you are still constipated or irregular after using this medication for 7 days in a row.  If you continue to have problems with postoperative  constipation, please contact the office for further assistance and recommendations.  If you experience "the worst abdominal pain ever" or develop nausea or vomiting, please contact the office immediatly for further recommendations for treatment.  ITCHING If you experience itching with your medications, try taking only a single pain pill, or even half a pain pill at a time.  You can also use Benadryl over the counter for itching or also to help with sleep.   MEDICATIONS See your medication summary on the "After Visit Summary" that the nursing staff will review with you prior to discharge.  You may have some home medications which will be placed on hold until you complete the course of blood thinner medication.  It is important for you to complete the blood thinner medication as prescribed by your surgeon.  Continue your approved medications as instructed at time of discharge.  PRECAUTIONS If you experience chest pain or shortness of breath - call 911 immediately for transfer to the hospital emergency department.  If you develop a fever greater that 101 F, purulent drainage from wound, increased redness   or drainage from wound, foul odor from the wound/dressing, or calf pain - CONTACT YOUR SURGEON.                                                   FOLLOW-UP APPOINTMENTS Make sure you keep all of your appointments after your operation with your surgeon and caregivers. You should call the office at the above phone number and make an appointment for approximately two weeks after the date of your surgery or on the date instructed by your surgeon outlined in the "After Visit Summary".  RANGE OF MOTION AND STRENGTHENING EXERCISES  Rehabilitation of the knee is important following a knee injury or an operation. After just a few days of immobilization, the muscles of the thigh which control the knee become weakened and shrink (atrophy). Knee exercises are designed to build up the tone and strength of the thigh  muscles and to improve knee motion. Often times heat used for twenty to thirty minutes before working out will loosen up your tissues and help with improving the range of motion but do not use heat for the first two weeks following surgery. These exercises can be done on a training (exercise) mat, on the floor, on a table or on a bed. Use what ever works the best and is most comfortable for you Knee exercises include:  Leg Lifts - While your knee is still immobilized in a splint or cast, you can do straight leg raises. Lift the leg to 60 degrees, hold for 3 sec, and slowly lower the leg. Repeat 10-20 times 2-3 times daily. Perform this exercise against resistance later as your knee gets better.  Quad and Hamstring Sets - Tighten up the muscle on the front of the thigh (Quad) and hold for 5-10 sec. Repeat this 10-20 times hourly. Hamstring sets are done by pushing the foot backward against an object and holding for 5-10 sec. Repeat as with quad sets.  Leg Slides: Lying on your back, slowly slide your foot toward your buttocks, bending your knee up off the floor (only go as far as is comfortable). Then slowly slide your foot back down until your leg is flat on the floor again. Angel Wings: Lying on your back spread your legs to the side as far apart as you can without causing discomfort.  A rehabilitation program following serious knee injuries can speed recovery and prevent re-injury in the future due to weakened muscles. Contact your doctor or a physical therapist for more information on knee rehabilitation.   POST-OPERATIVE OPIOID TAPER INSTRUCTIONS: It is important to wean off of your opioid medication as soon as possible. If you do not need pain medication after your surgery it is ok to stop day one. Opioids include: Codeine, Hydrocodone(Norco, Vicodin), Oxycodone(Percocet, oxycontin) and hydromorphone amongst others.  Long term and even short term use of opiods can cause: Increased pain  response Dependence Constipation Depression Respiratory depression And more.  Withdrawal symptoms can include Flu like symptoms Nausea, vomiting And more Techniques to manage these symptoms Hydrate well Eat regular healthy meals Stay active Use relaxation techniques(deep breathing, meditating, yoga) Do Not substitute Alcohol to help with tapering If you have been on opioids for less than two weeks and do not have pain than it is ok to stop all together.  Plan to wean off of opioids This   plan should start within one week post op of your joint replacement. Maintain the same interval or time between taking each dose and first decrease the dose.  Cut the total daily intake of opioids by one tablet each day Next start to increase the time between doses. The last dose that should be eliminated is the evening dose.   IF YOU ARE TRANSFERRED TO A SKILLED REHAB FACILITY If the patient is transferred to a skilled rehab facility following release from the hospital, a list of the current medications will be sent to the facility for the patient to continue.  When discharged from the skilled rehab facility, please have the facility set up the patient's Home Health Physical Therapy prior to being released. Also, the skilled facility will be responsible for providing the patient with their medications at time of release from the facility to include their pain medication, the muscle relaxants, and their blood thinner medication. If the patient is still at the rehab facility at time of the two week follow up appointment, the skilled rehab facility will also need to assist the patient in arranging follow up appointment in our office and any transportation needs.  MAKE SURE YOU:  Understand these instructions.  Get help right away if you are not doing well or get worse.   DENTAL ANTIBIOTICS:  In most cases prophylactic antibiotics for Dental procdeures after total joint surgery are not  necessary.  Exceptions are as follows:  1. History of prior total joint infection  2. Severely immunocompromised (Organ Transplant, cancer chemotherapy, Rheumatoid biologic meds such as Humera)  3. Poorly controlled diabetes (A1C &gt; 8.0, blood glucose over 200)  If you have one of these conditions, contact your surgeon for an antibiotic prescription, prior to your dental procedure.    Pick up stool softner and laxative for home use following surgery while on pain medications. Do not submerge incision under water. Please use good hand washing techniques while changing dressing each day. May shower starting three days after surgery. Please use a clean towel to pat the incision dry following showers. Continue to use ice for pain and swelling after surgery. Do not use any lotions or creams on the incision until instructed by your surgeon.  

## 2020-12-12 ENCOUNTER — Encounter (HOSPITAL_COMMUNITY): Payer: Self-pay | Admitting: Orthopedic Surgery

## 2020-12-12 LAB — CBC
HCT: 42 % (ref 39.0–52.0)
Hemoglobin: 14.6 g/dL (ref 13.0–17.0)
MCH: 33.5 pg (ref 26.0–34.0)
MCHC: 34.8 g/dL (ref 30.0–36.0)
MCV: 96.3 fL (ref 80.0–100.0)
Platelets: 193 10*3/uL (ref 150–400)
RBC: 4.36 MIL/uL (ref 4.22–5.81)
RDW: 11.9 % (ref 11.5–15.5)
WBC: 12.6 10*3/uL — ABNORMAL HIGH (ref 4.0–10.5)
nRBC: 0 % (ref 0.0–0.2)

## 2020-12-12 LAB — BASIC METABOLIC PANEL
Anion gap: 8 (ref 5–15)
BUN: 14 mg/dL (ref 8–23)
CO2: 24 mmol/L (ref 22–32)
Calcium: 8.6 mg/dL — ABNORMAL LOW (ref 8.9–10.3)
Chloride: 104 mmol/L (ref 98–111)
Creatinine, Ser: 1.06 mg/dL (ref 0.61–1.24)
GFR, Estimated: >60 mL/min (ref >60)
Glucose, Bld: 186 mg/dL — ABNORMAL HIGH (ref 70–99)
Potassium: 4.6 mmol/L (ref 3.5–5.1)
Sodium: 136 mmol/L (ref 135–145)

## 2020-12-12 MED ORDER — OXYCODONE HCL 5 MG PO TABS: 5.0000 mg | ORAL_TABLET | Freq: Four times a day (QID) | ORAL | 0 refills | Status: DC | PRN

## 2020-12-12 MED ORDER — ASPIRIN 325 MG PO TBEC: 325.0000 mg | DELAYED_RELEASE_TABLET | Freq: Two times a day (BID) | ORAL | 0 refills | Status: AC

## 2020-12-12 MED ORDER — METHOCARBAMOL 500 MG PO TABS: 500.0000 mg | ORAL_TABLET | Freq: Four times a day (QID) | ORAL | 0 refills | Status: DC | PRN

## 2020-12-12 MED ORDER — TRAMADOL HCL 50 MG PO TABS: 50.0000 mg | ORAL_TABLET | Freq: Four times a day (QID) | ORAL | 0 refills | Status: DC | PRN

## 2020-12-12 NOTE — TOC Transition Note (Signed)
Transition of Care [facility]) - CM/SW Discharge Note  Patient Details  Name: [name] MRN: [id number] Date of Birth: [birth year]  [facility] CM/SW Contact:  Sherie Don, LCSW Phone Number: 12/12/2020, 11:28 AM  Clinical Narrative: Patient is expected to discharge after working with PT. CSW met with patient and his wife to confirm discharge plan. Patient will get OPPT at [facility]. Patient has a rolling walker and high toilets, so there are no DME needs at this time. TOC signing off.  Final next level of care: OP Rehab Barriers to Discharge: No Barriers Identified  Patient Goals and CMS Choice Patient states their goals for this hospitalization and ongoing recovery are:: Discharge home with [facility] CMS Medicare.gov Compare Post Acute Care list provided to:: Patient Choice offered to / list presented to : NA  Discharge Plan and Services         DME Arranged: N/A DME Agency: NA  Readmission Risk Interventions No flowsheet data found.

## 2020-12-12 NOTE — Evaluation (Signed)
Physical Therapy Evaluation Patient Details Name: [name] MRN: [id number] DOB: May 21, 1950 Today's Date: 12/12/2020   History of Present Illness  Pt is a 70 year old male s/p Right knee polyethylene revision on 12/11/20.  PMHx significant for R TKA 12/05/18, eczema, nephrolithasis, HLD, PNA, C6-C7 ACDF 2008, R RTC repair 2018.  Clinical Impression  Pt is s/p TKA revision resulting in the deficits listed below (see PT Problem List).  Pt assisted with ambulating in hallway and practiced one step.  Pt and spouse perseverating on finding pt's partial that has been misplaced.  Will return to review HEP and ambulate once more prior to d/c today.  Pt will benefit from skilled PT to increase their independence and safety with mobility to allow discharge to the venue listed below.      Follow Up Recommendations Follow surgeon's recommendation for DC plan and follow-up therapies    Equipment Recommendations  None recommended by PT    Recommendations for Other Services       Precautions / Restrictions Precautions Precautions: Fall;Knee Restrictions Weight Bearing Restrictions: No Other Position/Activity Restrictions: WBAT      Mobility  Bed Mobility Overal bed mobility: Needs Assistance Bed Mobility: Supine to Sit     Supine to sit: Supervision;HOB elevated          Transfers Overall transfer level: Needs assistance Equipment used: Rolling walker (2 wheeled) Transfers: Sit to/from Stand Sit to Stand: Min guard         General transfer comment: verbal cues for safe technique and having RW in place for safety  Ambulation/Gait Ambulation/Gait assistance: Min guard Gait Distance (Feet): 200 Feet Assistive device: Rolling walker (2 wheeled) Gait Pattern/deviations: Step-to pattern;Decreased stance time - right;Antalgic Gait velocity: decr   General Gait Details: verbal cues for sequence, RW positioning, posture  Stairs Stairs: Yes Stairs assistance: Min guard Stair  Management: Forwards;Step to pattern;With walker Number of Stairs: 1 General stair comments: verbal cues for RW positioning and sequencing; pt performed twice and reports understanding  Wheelchair Mobility    Modified Rankin (Stroke Patients Only)       Balance                                             Pertinent Vitals/Pain Pain Assessment: 0-10 Pain Score: 4  Pain Location: right knee Pain Descriptors / Indicators: Sore;Aching Pain Intervention(s): Monitored during session;Repositioned    Home Living Family/patient expects to be discharged to:: Private residence Living Arrangements: Spouse/significant other Available Help at Discharge: Family;Available 24 hours/day Type of Home: House Home Access: Stairs to enter Entrance Stairs-Rails: None Entrance Stairs-Number of Steps: 1+1 Home Layout: One level Home Equipment: Walker - 2 wheels;Cane - single point      Prior Function Level of Independence: Independent               Hand Dominance        Extremity/Trunk Assessment        Lower Extremity Assessment Lower Extremity Assessment: RLE deficits/detail RLE Deficits / Details: observed at lesat 80* AROM knee flexion sitting EOB, pt able to perform ankle pumps       Communication   Communication: No difficulties  Cognition Arousal/Alertness: Awake/alert Behavior During Therapy: WFL for tasks assessed/performed Overall Cognitive Status: Within Functional Limits for tasks assessed  General Comments      Exercises     Assessment/Plan    PT Assessment Patient needs continued PT services  PT Problem List Decreased strength;Decreased range of motion;Pain;Decreased mobility;Decreased activity tolerance;Decreased knowledge of precautions       PT Treatment Interventions Stair training;Gait training;DME instruction;Therapeutic exercise;Balance training;Therapeutic  activities;Patient/family education;Functional mobility training    PT Goals (Current goals can be found in the Care Plan section)  Acute Rehab PT Goals PT Goal Formulation: With patient/family Time For Goal Achievement: 12/19/20 Potential to Achieve Goals: Good    Frequency 7X/week   Barriers to discharge        Co-evaluation               AM-PAC PT "6 Clicks" Mobility  Outcome Measure Help needed turning from your back to your side while in a flat bed without using bedrails?: None Help needed moving from lying on your back to sitting on the side of a flat bed without using bedrails?: A Little Help needed moving to and from a bed to a chair (including a wheelchair)?: A Little Help needed standing up from a chair using your arms (e.g., wheelchair or bedside chair)?: A Little Help needed to walk in hospital room?: A Little Help needed climbing 3-5 steps with a railing? : A Little 6 Click Score: 19    End of Session Equipment Utilized During Treatment: Gait belt Activity Tolerance: Patient tolerated treatment well Patient left: in chair;with call bell/phone within reach;with family/visitor present Nurse Communication: Mobility status PT Visit Diagnosis: Difficulty in walking, not elsewhere classified (R26.2)    Time: [id number] PT Time Calculation (min) (ACUTE ONLY): 20 min   Charges:   PT Evaluation $PT Eval Low Complexity: 1 Low        Kati PT, DPT Acute Rehabilitation Services Pager: [phone] Office: [phone]   Enez Monahan,KATHrine E 12/12/2020, 11:17 AM

## 2020-12-12 NOTE — Plan of Care (Signed)
  Problem: Pain Management: Goal: Pain level will decrease with appropriate interventions Outcome: Progressing   Problem: Clinical Measurements: Goal: Will remain free from infection Outcome: Progressing   Problem: Activity: Goal: Risk for activity intolerance will decrease Outcome: Progressing

## 2020-12-12 NOTE — Progress Notes (Signed)
Physical Therapy Treatment Patient Details Name: [name] MRN: [id number] DOB: 04-May-1950 Today's Date: 12/12/2020    History of Present Illness Pt is a 70 year old male s/p Right knee polyethylene revision on 12/11/20.  PMHx significant for R TKA 12/05/18, eczema, nephrolithasis, HLD, PNA, C6-C7 ACDF 2008, R RTC repair 2018.    PT Comments    Pt ambulated in hallway and practiced one step again with spouse observing.  Pt also performed HEP exercises while viewing handout.  Pt reports understanding and feels ready for d/c home today.  Pt and spouse had no further questions.    Follow Up Recommendations  Follow surgeon's recommendation for DC plan and follow-up therapies     Equipment Recommendations  None recommended by PT    Recommendations for Other Services       Precautions / Restrictions Precautions Precautions: Fall;Knee Restrictions Weight Bearing Restrictions: No Other Position/Activity Restrictions: WBAT    Mobility  Bed Mobility Overal bed mobility: Needs Assistance Bed Mobility: Supine to Sit     Supine to sit: Supervision;HOB elevated     General bed mobility comments: pt in recliner    Transfers Overall transfer level: Needs assistance Equipment used: Rolling walker (2 wheeled) Transfers: Sit to/from Stand Sit to Stand: Min guard         General transfer comment: verbal cues for safe technique and having RW in place for safety  Ambulation/Gait Ambulation/Gait assistance: Min guard;Supervision Gait Distance (Feet): 200 Feet Assistive device: Rolling walker (2 wheeled) Gait Pattern/deviations: Step-to pattern;Decreased stance time - right;Antalgic Gait velocity: decr   General Gait Details: verbal cues for sequence, RW positioning, posture   Stairs Stairs: Yes Stairs assistance: Min guard Stair Management: Forwards;Step to pattern;With walker Number of Stairs: 1 General stair comments: verbal cues for RW positioning and sequencing; pt  performed twice and reports understanding; spouse observed   Wheelchair Mobility    Modified Rankin (Stroke Patients Only)       Balance                                            Cognition Arousal/Alertness: Awake/alert Behavior During Therapy: WFL for tasks assessed/performed Overall Cognitive Status: Within Functional Limits for tasks assessed                                        Exercises Total Joint Exercises Ankle Circles/Pumps: AROM;Both;10 reps Quad Sets: AROM;Both;10 reps Towel Squeeze: AROM;Both;10 reps Short Arc QuadSinclair Ship;Right;10 reps Heel Slides: AAROM;Right;10 reps Hip ABduction/ADduction: AAROM;Right;10 reps Straight Leg Raises: AAROM;Right;10 reps    General Comments        Pertinent Vitals/Pain Pain Assessment: 0-10 Pain Score: 5  Pain Location: right knee Pain Descriptors / Indicators: Sore;Aching Pain Intervention(s): Repositioned;Monitored during session    Home Living Family/patient expects to be discharged to:: Private residence Living Arrangements: Spouse/significant other Available Help at Discharge: Family;Available 24 hours/day Type of Home: House Home Access: Stairs to enter Entrance Stairs-Rails: None Home Layout: One level Home Equipment: Environmental consultant - 2 wheels;Cane - single point      Prior Function Level of Independence: Independent          PT Goals (current goals can now be found in the care plan section) Acute Rehab PT Goals PT Goal Formulation: With patient/family  Time For Goal Achievement: 12/19/20 Potential to Achieve Goals: Good Progress towards PT goals: Progressing toward goals    Frequency    7X/week      PT Plan Current plan remains appropriate    Co-evaluation              AM-PAC PT "6 Clicks" Mobility   Outcome Measure  Help needed turning from your back to your side while in a flat bed without using bedrails?: None Help needed moving from lying on your  back to sitting on the side of a flat bed without using bedrails?: A Little Help needed moving to and from a bed to a chair (including a wheelchair)?: A Little Help needed standing up from a chair using your arms (e.g., wheelchair or bedside chair)?: A Little Help needed to walk in hospital room?: A Little Help needed climbing 3-5 steps with a railing? : A Little 6 Click Score: 19    End of Session Equipment Utilized During Treatment: Gait belt Activity Tolerance: Patient tolerated treatment well Patient left: in chair;with call bell/phone within reach;with family/visitor present Nurse Communication: Mobility status PT Visit Diagnosis: Difficulty in walking, not elsewhere classified (R26.2)     Time: [id number] PT Time Calculation (min) (ACUTE ONLY): 20 min  Charges:  $Therapeutic Exercise: 8-22 mins                     Jannette Spanner PT, DPT Acute Rehabilitation Services Pager: [phone] Office: [phone]  Danai Gotto,KATHrine E 12/12/2020, 1:06 PM

## 2020-12-12 NOTE — Progress Notes (Signed)
Subjective: 1 Day Post-Op Procedure(s) (LRB): Right knee polyethylene revision (Right) Patient reports pain as mild.   Patient seen in rounds with Dr. Wynelle Link. Patient is well, and has had no acute complaints or problems. States he is ready to go home. Denies chest pain or SOB.  We will begin therapy today.   Objective: Vital signs in last 24 hours: Temp:  [97.5 F (36.4 C)-98.4 F (36.9 C)] 97.7 F (36.5 C) (08/18 0511) Pulse Rate:  [51-80] 66 (08/18 0511) Resp:  [12-26] 17 (08/18 0511) BP: (105-159)/(71-99) 125/87 (08/18 0511) SpO2:  [95 %-100 %] 96 % (08/18 0511) Weight:  [108.9 kg] 108.9 kg (08/17 1218)  Intake/Output from previous day:  Intake/Output Summary (Last 24 hours) at 12/12/2020 0732 Last data filed at 12/12/2020 [id number] Gross per 24 hour  Intake 4352.5 ml  Output 2800 ml  Net 1552.5 ml     Intake/Output this shift: No intake/output data recorded.  Labs: Recent Labs    12/11/20 1542 12/12/20 0322  HGB 14.2 14.6   Recent Labs    12/11/20 1542 12/12/20 0322  WBC 5.3 12.6*  RBC 4.19* 4.36  HCT 41.2 42.0  PLT 181 193   Recent Labs    12/11/20 1542 12/12/20 0322  NA 138 136  K 4.0 4.6  CL 106 104  CO2 27 24  BUN 18 14  CREATININE 1.02 1.06  GLUCOSE 93 186*  CALCIUM 8.2* 8.6*   Recent Labs    12/11/20 1542  INR 1.2    Exam: General - Patient is Alert and Oriented Extremity - Neurologically intact Neurovascular intact Sensation intact distally Dorsiflexion/Plantar flexion intact Dressing - dressing C/D/I Motor Function - intact, moving foot and toes well on exam.   Past Medical History:  Diagnosis Date   Anxiety    Arthritis    Asthma    At risk for sleep apnea    STOP-BANG = 4  SENT TO PCP 05-24-2013   Eczema    Edema of foot    Finger wound, simple, open    left middle index finger - stitches and dressing occured on 09/22/2016- followed by Dr Daws at [facility]    H/O hiatal hernia    History of kidney stones    HOH  (hard of hearing)    Hyperlipidemia    Influenza A    Influenza B    symptoms started 05-16-2013/  dx 05-17-2013 by pcp   Left ankle injury    twisted left ankle    Left ureteral calculus    Pneumonia    hx of    Rash    right leg- calf -    Wears dentures    bottom    Assessment/Plan: 1 Day Post-Op Procedure(s) (LRB): Right knee polyethylene revision (Right) Active Problems:   Failed total right knee replacement (HCC)  Estimated body mass index is 33.47 kg/m as calculated from the following:   Height as of this encounter: '5\' 11"'$  (1.803 m).   Weight as of this encounter: 108.9 kg. Advance diet Up with therapy D/C IV fluids  DVT Prophylaxis - Aspirin Weight bearing as tolerated. Continue therapy.  Plan is to go Home after hospital stay. Plan for discharge later today if progresses with PT and meeting goals. Scheduled for OPPT at [facility] Follow-up in the office in 2 weeks  The [place] was reviewed today prior to any opioid medications being prescribed to this patient.  Theresa Duty, PA-C Orthopedic Surgery [phone] 12/12/2020, 7:32  AM

## 2020-12-12 NOTE — Progress Notes (Signed)
I've redone this assessment 5 times and it continues to come up as not complete.

## 2020-12-12 NOTE — Plan of Care (Signed)
Plan of care reviewed and discussed with the patient and his wife. 

## 2020-12-12 NOTE — Plan of Care (Signed)
Discharge instructions given to the patient and his wife.

## 2020-12-12 NOTE — Progress Notes (Signed)
Orthopedic Tech Progress Note Patient Details:  [name] [birth year] [id number]  Patient ID: [name], male   DOB: November 09, 1950, 70 y.o.   MRN: [id number]  [name] 12/12/2020, 12:40 PMPickup CPM

## 2020-12-16 NOTE — Discharge Summary (Signed)
Physician Discharge Summary   Patient ID: [name] MRN: [id number] DOB/AGE: 1950-07-09 70 y.o.  Admit date: 12/11/2020 Discharge date: 12/12/2020  Primary Diagnosis: Unstable right total knee arthroplasty   Admission Diagnoses:  Past Medical History:  Diagnosis Date   Anxiety    Arthritis    Asthma    At risk for sleep apnea    STOP-BANG = 4  SENT TO PCP 05-24-2013   Eczema    Edema of foot    Finger wound, simple, open    left middle index finger - stitches and dressing occured on 09/22/2016- [facility] Daws [facility]    H/O hiatal hernia    History of kidney stones    HOH (hard of hearing)    Hyperlipidemia    Influenza A    Influenza B    symptoms started 05-16-2013/  dx 05-17-2013 by pcp   Left ankle injury    twisted left ankle    Left ureteral calculus    Pneumonia    hx of    Rash    right leg- calf -    Wears dentures    bottom   Discharge Diagnoses:   Active Problems:   Failed total right knee replacement (HCC)  Estimated body mass index is 33.47 kg/m as calculated from the following:   Height as of this encounter: '5\' 11"'$  (1.803 m).   Weight as of this encounter: 108.9 kg.  Procedure:  Procedure(s) (LRB): Right knee polyethylene revision (Right)   Consults: None  HPI: The patient is a 70 year old male who had a right total knee arthroplasty done several years ago.  He was doing fine and recently started having increasing pain and instability symptoms.  Exam showed that he has developed significant varus, valgus and anterior, posterior laxity in the knee that was not present initially.  He has had pain related to this.  He has had mechanical symptoms related to this.  He presents now for polyethylene revision.  Laboratory Data: Admission on 12/11/2020, Discharged on 12/12/2020  Component Date Value Ref Range Status   WBC 12/11/2020 5.3  4.0 - 10.5 K/uL Final   RBC 12/11/2020 4.19 (A) 4.22 - 5.81 MIL/uL Final   Hemoglobin 12/11/2020  14.2  13.0 - 17.0 g/dL Final   HCT 12/11/2020 41.2  39.0 - 52.0 % Final   MCV 12/11/2020 98.3  80.0 - 100.0 fL Final   MCH 12/11/2020 33.9  26.0 - 34.0 pg Final   MCHC 12/11/2020 34.5  30.0 - 36.0 g/dL Final   RDW 12/11/2020 11.8  11.5 - 15.5 % Final   Platelets 12/11/2020 181  150 - 400 K/uL Final   nRBC 12/11/2020 0.0  0.0 - 0.2 % Final   Performed at [facility], [place] [address], [place], [place] [address]   Sodium 12/11/2020 138  135 - 145 mmol/L Final   Potassium 12/11/2020 4.0  3.5 - 5.1 mmol/L Final   Chloride 12/11/2020 106  98 - 111 mmol/L Final   CO2 12/11/2020 27  22 - 32 mmol/L Final   Glucose, Bld 12/11/2020 93  70 - 99 mg/dL Final   Glucose reference range applies only to samples taken after fasting for at least 8 hours.   BUN 12/11/2020 18  8 - 23 mg/dL Final   Creatinine, Ser 12/11/2020 1.02  0.61 - 1.24 mg/dL Final   Calcium 12/11/2020 8.2 (A) 8.9 - 10.3 mg/dL Final   Total Protein 12/11/2020 5.7 (A) 6.5 -  8.1 g/dL Final   Albumin 12/11/2020 3.6  3.5 - 5.0 g/dL Final   AST 12/11/2020 17  15 - 41 U/L Final   ALT 12/11/2020 20  0 - 44 U/L Final   Alkaline Phosphatase 12/11/2020 44  38 - 126 U/L Final   Total Bilirubin 12/11/2020 1.0  0.3 - 1.2 mg/dL Final   GFR, Estimated 12/11/2020 >60  >60 mL/min Final   Comment: (NOTE) Calculated using the CKD-EPI Creatinine Equation (2021)    Anion gap 12/11/2020 5  5 - 15 Final   Performed at [facility], [place] [address] [place], [place] [address]   Prothrombin Time 12/11/2020 14.8  11.4 - 15.2 seconds Final   INR 12/11/2020 1.2  0.8 - 1.2 Final   Comment: (NOTE) INR goal varies based on device and disease states. Performed at [facility], [place] [address] [place], [place] [address]    WBC 12/12/2020 12.6 (A) 4.0 - 10.5 K/uL Final   RBC 12/12/2020 4.36  4.22 - 5.81 MIL/uL Final   Hemoglobin 12/12/2020 14.6  13.0 - 17.0 g/dL Final   HCT 12/12/2020 42.0  39.0 - 52.0 %  Final   MCV 12/12/2020 96.3  80.0 - 100.0 fL Final   MCH 12/12/2020 33.5  26.0 - 34.0 pg Final   MCHC 12/12/2020 34.8  30.0 - 36.0 g/dL Final   RDW 12/12/2020 11.9  11.5 - 15.5 % Final   Platelets 12/12/2020 193  150 - 400 K/uL Final   nRBC 12/12/2020 0.0  0.0 - 0.2 % Final   Performed at [facility], [place] [address] [place], [place] [address]   Sodium 12/12/2020 136  135 - 145 mmol/L Final   Potassium 12/12/2020 4.6  3.5 - 5.1 mmol/L Final   Chloride 12/12/2020 104  98 - 111 mmol/L Final   CO2 12/12/2020 24  22 - 32 mmol/L Final   Glucose, Bld 12/12/2020 186 (A) 70 - 99 mg/dL Final   Glucose reference range applies only to samples taken after fasting for at least 8 hours.   BUN 12/12/2020 14  8 - 23 mg/dL Final   Creatinine, Ser 12/12/2020 1.06  0.61 - 1.24 mg/dL Final   Calcium 12/12/2020 8.6 (A) 8.9 - 10.3 mg/dL Final   GFR, Estimated 12/12/2020 >60  >60 mL/min Final   Comment: (NOTE) Calculated using the CKD-EPI Creatinine Equation (2021)    Anion gap 12/12/2020 8  5 - 15 Final   Performed at [facility], [place] [address], [place], [place] [address]  Orders Only on 12/09/2020  Component Date Value Ref Range Status   SARS Coronavirus 2 12/09/2020 RESULT: NEGATIVE   Final   Comment: RESULT: NEGATIVESARS-CoV-2 INTERPRETATION:A NEGATIVE  test result means that SARS-CoV-2 RNA was not present in the specimen above the limit of detection of this test. This does not preclude a possible SARS-CoV-2 infection and should not be used as the  sole basis for patient management decisions. Negative results must be combined with clinical observations, patient history, and epidemiological information. Optimum specimen types and timing for peak viral levels during infections caused by SARS-CoV-2  have not been determined. Collection of multiple specimens or types of specimens may be necessary to detect virus. Improper specimen collection and handling, sequence  variability under primers/probes, or organism present below the limit of detection may  lead to false negative results. Positive and negative predictive values of testing are highly dependent on prevalence. False negative test results are more  likely when prevalence of disease is high.The expected result is NEGATIVE.Fact S                          heet for  Healthcare Providers: LocalChronicle.no Sheet for Patients: SalonLookup.es Reference Range - Negative   Hospital Outpatient Visit on 11/29/2020  Component Date Value Ref Range Status   MRSA, PCR 11/29/2020 NEGATIVE  NEGATIVE Final   Staphylococcus aureus 11/29/2020 NEGATIVE  NEGATIVE Final   Comment: (NOTE) The Xpert SA Assay (FDA approved for NASAL specimens in patients 53 years of age and older), is one component of a comprehensive surveillance program. It is not intended to diagnose infection nor to guide or monitor treatment. Performed at [facility], [place] [address] [place], [place] [address]   Office Visit on 11/27/2020  Component Date Value Ref Range Status   WBC 11/27/2020 8.5  4.0 - 10.5 K/uL Final   RBC 11/27/2020 4.66  4.22 - 5.81 Mil/uL Final   Platelets 11/27/2020 200.0  150.0 - 400.0 K/uL Final   Hemoglobin 11/27/2020 15.8  13.0 - 17.0 g/dL Final   HCT 11/27/2020 46.2  39.0 - 52.0 % Final   MCV 11/27/2020 99.2  78.0 - 100.0 fl Final   MCHC 11/27/2020 34.1  30.0 - 36.0 g/dL Final   RDW 11/27/2020 13.0  11.5 - 15.5 % Final   Sodium 11/27/2020 139  135 - 145 mEq/L Final   Potassium 11/27/2020 4.4  3.5 - 5.1 mEq/L Final   Chloride 11/27/2020 104  96 - 112 mEq/L Final   CO2 11/27/2020 27  19 - 32 mEq/L Final   Glucose, Bld 11/27/2020 79  70 - 99 mg/dL Final   BUN 11/27/2020 18  6 - 23 mg/dL Final   Creatinine, Ser 11/27/2020 1.11  0.40 - 1.50 mg/dL Final   Total Bilirubin 11/27/2020 1.3 (A) 0.2 - 1.2 mg/dL Final   Alkaline Phosphatase  11/27/2020 51  39 - 117 U/L Final   AST 11/27/2020 21  0 - 37 U/L Final   ALT 11/27/2020 26  0 - 53 U/L Final   Total Protein 11/27/2020 6.8  6.0 - 8.3 g/dL Final   Albumin 11/27/2020 4.3  3.5 - 5.2 g/dL Final   GFR 11/27/2020 66.77  >60.00 mL/min Final   Calculated using the CKD-EPI Creatinine Equation (2021)   Calcium 11/27/2020 9.2  8.4 - 10.5 mg/dL Final   Hgb A1c MFr Bld 11/27/2020 5.5  4.6 - 6.5 % Final   Glycemic Control Guidelines for People with Diabetes:Non Diabetic:  <6%Goal of Therapy: <7%Additional Action Suggested:  >8%    INR 11/27/2020 1.1 (A) 0.8 - 1.0 ratio Final   Prothrombin Time 11/27/2020 11.7  9.6 - 13.1 sec Final   Color, Urine 11/27/2020 YELLOW  Yellow;Lt. Yellow;Straw;Dark Yellow;Amber;Green;Red;Brown Final   APPearance 11/27/2020 CLEAR  Clear;Turbid;Slightly Cloudy;Cloudy Final   Specific Gravity, Urine 11/27/2020 >=1.030 (A) 1.000 - 1.030 Final   pH 11/27/2020 6.0  5.0 - 8.0 Final   Total Protein, Urine 11/27/2020 NEGATIVE  Negative Final   Urine Glucose 11/27/2020 NEGATIVE  Negative Final   Ketones, ur 11/27/2020 NEGATIVE  Negative Final   Bilirubin Urine 11/27/2020 NEGATIVE  Negative Final   Hgb urine dipstick 11/27/2020 NEGATIVE  Negative Final   Urobilinogen, UA 11/27/2020 1.0  0.0 - 1.0 Final   Leukocytes,Ua 11/27/2020 NEGATIVE  Negative Final   Nitrite 11/27/2020 NEGATIVE  Negative Final   WBC, UA 11/27/2020 0-2/hpf  0-2/hpf Final  RBC / HPF 11/27/2020 0-2/hpf  0-2/hpf Final   Squamous Epithelial / LPF 11/27/2020 Rare(0-4/hpf)  Rare(0-4/hpf) Final   Ca Oxalate Crys, UA 11/27/2020 Presence of (A) None Final     X-Rays:No results found.  EKG: Orders placed or performed in visit on 11/27/20   EKG 12-Lead     Hospital Course: [name] is a 70 y.o. who was admitted to [facility]. They were brought to the operating room on 12/11/2020 and underwent Procedure(s): Right knee polyethylene revision.  Patient tolerated the procedure well and  was later transferred to the recovery room and then to the orthopaedic floor for postoperative care. They were given PO and IV analgesics for pain control following their surgery. They were given 24 hours of postoperative antibiotics of  Anti-infectives (From admission, onward)    Start     Dose/Rate Route Frequency Ordered Stop   12/12/20 0600  ceFAZolin (ANCEF) IVPB 2g/100 mL premix        2 g 200 mL/hr over 30 Minutes Intravenous On call to O.R. 12/11/20 1201 12/11/20 1440   12/11/20 2000  ceFAZolin (ANCEF) IVPB 2g/100 mL premix        2 g 200 mL/hr over 30 Minutes Intravenous Every 6 hours 12/11/20 1828 12/12/20 0350      and started on DVT prophylaxis in the form of Aspirin.   PT and OT were ordered for total joint protocol. Discharge planning consulted to help with postop disposition and equipment needs.  Patient had a good night on the evening of surgery. They started to get up OOB with therapy on POD #0. Pt was seen during rounds and was ready to go home pending progress with therapy. He worked with therapy on POD #1 and was meeting his goals. Pt was discharged to home later that day in stable condition.  Diet: Regular diet Activity: WBAT Follow-up: in 2 weeks Disposition: Home with OPPT Discharged Condition: stable   Discharge Instructions     Call MD / Call 911   Complete by: As directed    If you experience chest pain or shortness of breath, CALL 911 and be transported to the hospital emergency room.  If you develope a fever above 101 F, pus (white drainage) or increased drainage or redness at the wound, or calf pain, call your surgeon's office.   Change dressing   Complete by: As directed    You may remove the bulky bandage (ACE wrap and gauze) two days after surgery. You will have an adhesive waterproof bandage underneath. Leave this in place until your first follow-up appointment.   Constipation Prevention   Complete by: As directed    Drink plenty of fluids.  Prune juice  may be helpful.  You may use a stool softener, such as Colace (over the counter) 100 mg twice a day.  Use MiraLax (over the counter) for constipation as needed.   Diet - low sodium heart healthy   Complete by: As directed    Do not put a pillow under the knee. Place it under the heel.   Complete by: As directed    Driving restrictions   Complete by: As directed    No driving for two weeks   Post-operative opioid taper instructions:   Complete by: As directed    POST-OPERATIVE OPIOID TAPER INSTRUCTIONS: It is important to wean off of your opioid medication as soon as possible. If you do not need pain medication after your surgery it is ok to stop  day one. Opioids include: Codeine, Hydrocodone(Norco, Vicodin), Oxycodone(Percocet, oxycontin) and hydromorphone amongst others.  Long term and even short term use of opiods can cause: Increased pain response Dependence Constipation Depression Respiratory depression And more.  Withdrawal symptoms can include Flu like symptoms Nausea, vomiting And more Techniques to manage these symptoms Hydrate well Eat regular healthy meals Stay active Use relaxation techniques(deep breathing, meditating, yoga) Do Not substitute Alcohol to help with tapering If you have been on opioids for less than two weeks and do not have pain than it is ok to stop all together.  Plan to wean off of opioids This plan should start within one week post op of your joint replacement. Maintain the same interval or time between taking each dose and first decrease the dose.  Cut the total daily intake of opioids by one tablet each day Next start to increase the time between doses. The last dose that should be eliminated is the evening dose.      TED hose   Complete by: As directed    Use stockings (TED hose) for three weeks on both leg(s).  You may remove them at night for sleeping.   Weight bearing as tolerated   Complete by: As directed       Allergies as of  12/12/2020       Reactions   Sesame Oil Itching, Swelling   Tobramycin Itching, Swelling   Used in an eye oint-reaction        Medication List     STOP taking these medications    diclofenac Sodium 1 % Gel Commonly known as: Voltaren   predniSONE 10 MG tablet Commonly known as: DELTASONE   triamcinolone ointment 0.5 % Commonly known as: KENALOG       TAKE these medications    albuterol 108 (90 Base) MCG/ACT inhaler Commonly known as: ProAir HFA Inhale 2 puffs into the lungs every 6 (six) hours as needed for wheezing or shortness of breath.   aspirin 325 MG EC tablet Take 1 tablet (325 mg total) by mouth 2 (two) times daily for 20 days. Then take one 81 mg aspirin once a day for three weeks. Then discontinue aspirin.   ketoconazole 2 % cream Commonly known as: NIZORAL Apply 1 application topically daily as needed for irritation.   methocarbamol 500 MG tablet Commonly known as: ROBAXIN Take 1 tablet (500 mg total) by mouth every 6 (six) hours as needed for muscle spasms.   oxyCODONE 5 MG immediate release tablet Commonly known as: Oxy IR/ROXICODONE Take 1-2 tablets (5-10 mg total) by mouth every 6 (six) hours as needed for severe pain.   traMADol 50 MG tablet Commonly known as: ULTRAM Take 1-2 tablets (50-100 mg total) by mouth every 6 (six) hours as needed for moderate pain.   Vitamin B-12 1000 MCG/15ML Liqd Take 1,000 mcg by mouth daily.   vitamin C 1000 MG tablet Take 1,000 mg by mouth 2 (two) times daily.               Discharge Care Instructions  (From admission, onward)           Start     Ordered   12/12/20 0000  Weight bearing as tolerated        12/12/20 0736   12/12/20 0000  Change dressing       Comments: You may remove the bulky bandage (ACE wrap and gauze) two days after surgery. You will have an adhesive waterproof bandage underneath. Leave this  in place until your first follow-up appointment.   12/12/20 0736             Follow-up Information     Gaynelle Arabian, MD. Schedule an appointment as soon as possible for a visit on 12/24/2020.   Specialty: Orthopedic Surgery Contact information: [address] [place] [address] [phone]                 Signed: Theresa Duty, PA-C Orthopedic Surgery 12/16/2020, 7:57 AM

## 2020-12-18 DIAGNOSIS — M25561 Pain in right knee: Secondary | ICD-10-CM | POA: Diagnosis not present

## 2020-12-18 DIAGNOSIS — Z4789 Encounter for other orthopedic aftercare: Secondary | ICD-10-CM | POA: Diagnosis not present

## 2020-12-18 DIAGNOSIS — R600 Localized edema: Secondary | ICD-10-CM | POA: Diagnosis not present

## 2020-12-18 DIAGNOSIS — Z96651 Presence of right artificial knee joint: Secondary | ICD-10-CM | POA: Diagnosis not present

## 2020-12-18 DIAGNOSIS — R531 Weakness: Secondary | ICD-10-CM | POA: Diagnosis not present

## 2020-12-18 DIAGNOSIS — M25661 Stiffness of right knee, not elsewhere classified: Secondary | ICD-10-CM | POA: Diagnosis not present

## 2020-12-18 DIAGNOSIS — R262 Difficulty in walking, not elsewhere classified: Secondary | ICD-10-CM | POA: Diagnosis not present

## 2020-12-19 ENCOUNTER — Other Ambulatory Visit (HOSPITAL_COMMUNITY): Payer: PPO

## 2020-12-24 DIAGNOSIS — R531 Weakness: Secondary | ICD-10-CM | POA: Diagnosis not present

## 2020-12-24 DIAGNOSIS — M25561 Pain in right knee: Secondary | ICD-10-CM | POA: Diagnosis not present

## 2020-12-24 DIAGNOSIS — Z4789 Encounter for other orthopedic aftercare: Secondary | ICD-10-CM | POA: Diagnosis not present

## 2020-12-24 DIAGNOSIS — Z96651 Presence of right artificial knee joint: Secondary | ICD-10-CM | POA: Diagnosis not present

## 2020-12-24 DIAGNOSIS — R262 Difficulty in walking, not elsewhere classified: Secondary | ICD-10-CM | POA: Diagnosis not present

## 2020-12-24 DIAGNOSIS — R600 Localized edema: Secondary | ICD-10-CM | POA: Diagnosis not present

## 2020-12-24 DIAGNOSIS — M25661 Stiffness of right knee, not elsewhere classified: Secondary | ICD-10-CM | POA: Diagnosis not present

## 2020-12-27 DIAGNOSIS — Z96651 Presence of right artificial knee joint: Secondary | ICD-10-CM | POA: Diagnosis not present

## 2020-12-27 DIAGNOSIS — R531 Weakness: Secondary | ICD-10-CM | POA: Diagnosis not present

## 2020-12-27 DIAGNOSIS — R262 Difficulty in walking, not elsewhere classified: Secondary | ICD-10-CM | POA: Diagnosis not present

## 2020-12-27 DIAGNOSIS — M25561 Pain in right knee: Secondary | ICD-10-CM | POA: Diagnosis not present

## 2020-12-27 DIAGNOSIS — R600 Localized edema: Secondary | ICD-10-CM | POA: Diagnosis not present

## 2020-12-27 DIAGNOSIS — Z4789 Encounter for other orthopedic aftercare: Secondary | ICD-10-CM | POA: Diagnosis not present

## 2020-12-27 DIAGNOSIS — M25661 Stiffness of right knee, not elsewhere classified: Secondary | ICD-10-CM | POA: Diagnosis not present

## 2020-12-31 DIAGNOSIS — M25561 Pain in right knee: Secondary | ICD-10-CM | POA: Diagnosis not present

## 2020-12-31 DIAGNOSIS — Z96651 Presence of right artificial knee joint: Secondary | ICD-10-CM | POA: Diagnosis not present

## 2020-12-31 DIAGNOSIS — R600 Localized edema: Secondary | ICD-10-CM | POA: Diagnosis not present

## 2020-12-31 DIAGNOSIS — R262 Difficulty in walking, not elsewhere classified: Secondary | ICD-10-CM | POA: Diagnosis not present

## 2020-12-31 DIAGNOSIS — Z4789 Encounter for other orthopedic aftercare: Secondary | ICD-10-CM | POA: Diagnosis not present

## 2020-12-31 DIAGNOSIS — M25661 Stiffness of right knee, not elsewhere classified: Secondary | ICD-10-CM | POA: Diagnosis not present

## 2020-12-31 DIAGNOSIS — R531 Weakness: Secondary | ICD-10-CM | POA: Diagnosis not present

## 2021-01-02 DIAGNOSIS — R262 Difficulty in walking, not elsewhere classified: Secondary | ICD-10-CM | POA: Diagnosis not present

## 2021-01-02 DIAGNOSIS — M25561 Pain in right knee: Secondary | ICD-10-CM | POA: Diagnosis not present

## 2021-01-02 DIAGNOSIS — R600 Localized edema: Secondary | ICD-10-CM | POA: Diagnosis not present

## 2021-01-02 DIAGNOSIS — R531 Weakness: Secondary | ICD-10-CM | POA: Diagnosis not present

## 2021-01-02 DIAGNOSIS — Z96651 Presence of right artificial knee joint: Secondary | ICD-10-CM | POA: Diagnosis not present

## 2021-01-02 DIAGNOSIS — Z4789 Encounter for other orthopedic aftercare: Secondary | ICD-10-CM | POA: Diagnosis not present

## 2021-01-02 DIAGNOSIS — M25661 Stiffness of right knee, not elsewhere classified: Secondary | ICD-10-CM | POA: Diagnosis not present

## 2021-01-07 DIAGNOSIS — Z96651 Presence of right artificial knee joint: Secondary | ICD-10-CM | POA: Diagnosis not present

## 2021-01-07 DIAGNOSIS — Z4789 Encounter for other orthopedic aftercare: Secondary | ICD-10-CM | POA: Diagnosis not present

## 2021-01-07 DIAGNOSIS — M25561 Pain in right knee: Secondary | ICD-10-CM | POA: Diagnosis not present

## 2021-01-07 DIAGNOSIS — R262 Difficulty in walking, not elsewhere classified: Secondary | ICD-10-CM | POA: Diagnosis not present

## 2021-01-07 DIAGNOSIS — R600 Localized edema: Secondary | ICD-10-CM | POA: Diagnosis not present

## 2021-01-07 DIAGNOSIS — R531 Weakness: Secondary | ICD-10-CM | POA: Diagnosis not present

## 2021-01-07 DIAGNOSIS — M25661 Stiffness of right knee, not elsewhere classified: Secondary | ICD-10-CM | POA: Diagnosis not present

## 2021-01-09 DIAGNOSIS — R600 Localized edema: Secondary | ICD-10-CM | POA: Diagnosis not present

## 2021-01-09 DIAGNOSIS — M25661 Stiffness of right knee, not elsewhere classified: Secondary | ICD-10-CM | POA: Diagnosis not present

## 2021-01-09 DIAGNOSIS — R531 Weakness: Secondary | ICD-10-CM | POA: Diagnosis not present

## 2021-01-09 DIAGNOSIS — M25561 Pain in right knee: Secondary | ICD-10-CM | POA: Diagnosis not present

## 2021-01-09 DIAGNOSIS — Z4789 Encounter for other orthopedic aftercare: Secondary | ICD-10-CM | POA: Diagnosis not present

## 2021-01-09 DIAGNOSIS — Z96651 Presence of right artificial knee joint: Secondary | ICD-10-CM | POA: Diagnosis not present

## 2021-01-09 DIAGNOSIS — R262 Difficulty in walking, not elsewhere classified: Secondary | ICD-10-CM | POA: Diagnosis not present

## 2021-01-14 DIAGNOSIS — M25561 Pain in right knee: Secondary | ICD-10-CM | POA: Diagnosis not present

## 2021-01-14 DIAGNOSIS — M25661 Stiffness of right knee, not elsewhere classified: Secondary | ICD-10-CM | POA: Diagnosis not present

## 2021-01-14 DIAGNOSIS — Z96651 Presence of right artificial knee joint: Secondary | ICD-10-CM | POA: Diagnosis not present

## 2021-01-14 DIAGNOSIS — R531 Weakness: Secondary | ICD-10-CM | POA: Diagnosis not present

## 2021-01-14 DIAGNOSIS — R600 Localized edema: Secondary | ICD-10-CM | POA: Diagnosis not present

## 2021-01-14 DIAGNOSIS — R262 Difficulty in walking, not elsewhere classified: Secondary | ICD-10-CM | POA: Diagnosis not present

## 2021-01-14 DIAGNOSIS — Z4789 Encounter for other orthopedic aftercare: Secondary | ICD-10-CM | POA: Diagnosis not present

## 2021-01-23 DIAGNOSIS — L309 Dermatitis, unspecified: Secondary | ICD-10-CM | POA: Diagnosis not present

## 2021-02-20 DIAGNOSIS — Z23 Encounter for immunization: Secondary | ICD-10-CM | POA: Diagnosis not present

## 2021-03-05 DIAGNOSIS — D225 Melanocytic nevi of trunk: Secondary | ICD-10-CM | POA: Diagnosis not present

## 2021-03-05 DIAGNOSIS — R208 Other disturbances of skin sensation: Secondary | ICD-10-CM | POA: Diagnosis not present

## 2021-03-05 DIAGNOSIS — B353 Tinea pedis: Secondary | ICD-10-CM | POA: Diagnosis not present

## 2021-03-05 DIAGNOSIS — L538 Other specified erythematous conditions: Secondary | ICD-10-CM | POA: Diagnosis not present

## 2021-03-05 DIAGNOSIS — L298 Other pruritus: Secondary | ICD-10-CM | POA: Diagnosis not present

## 2021-03-05 DIAGNOSIS — L820 Inflamed seborrheic keratosis: Secondary | ICD-10-CM | POA: Diagnosis not present

## 2021-03-05 DIAGNOSIS — L821 Other seborrheic keratosis: Secondary | ICD-10-CM | POA: Diagnosis not present

## 2021-03-05 DIAGNOSIS — D485 Neoplasm of uncertain behavior of skin: Secondary | ICD-10-CM | POA: Diagnosis not present

## 2021-03-05 DIAGNOSIS — L723 Sebaceous cyst: Secondary | ICD-10-CM | POA: Diagnosis not present

## 2021-03-05 DIAGNOSIS — B354 Tinea corporis: Secondary | ICD-10-CM | POA: Diagnosis not present

## 2021-03-07 ENCOUNTER — Telehealth: Payer: Self-pay | Admitting: Family Medicine

## 2021-03-07 NOTE — Chronic Care Management (AMB) (Signed)
  Care Management   Follow Up Note   03/07/2021 Name: [name] MRN: [id number] DOB: 05-08-1950   Referred by: Vivi Barrack, MD Reason for referral : No chief complaint on file.   Successful contact was made with the patient to discuss care management and care coordination services. Patient declines engagement at this time.   Follow Up Plan: No further follow up required:    [place]

## 2021-03-07 NOTE — Chronic Care Management (AMB) (Deleted)
  Care Management   Follow Up Note   03/07/2021 Name: [name] MRN: [id number] DOB: January 30, 1951   Referred by: Vivi Barrack, MD Reason for referral : No chief complaint on file.   Successful contact was made with the patient to discuss care management and care coordination services. Patient declines engagement at this time.   Follow Up Plan: No further follow up required:    [place]

## 2021-03-17 ENCOUNTER — Ambulatory Visit (INDEPENDENT_AMBULATORY_CARE_PROVIDER_SITE_OTHER): Payer: PPO | Admitting: Family Medicine

## 2021-03-17 ENCOUNTER — Other Ambulatory Visit: Payer: Self-pay | Admitting: Family Medicine

## 2021-03-17 ENCOUNTER — Other Ambulatory Visit: Payer: Self-pay

## 2021-03-17 ENCOUNTER — Encounter: Payer: Self-pay | Admitting: Family Medicine

## 2021-03-17 VITALS — BP 165/101 | HR 77 | Temp 98.3°F | Ht 71.0 in | Wt 238.0 lb

## 2021-03-17 DIAGNOSIS — T162XXA Foreign body in left ear, initial encounter: Secondary | ICD-10-CM

## 2021-03-17 MED ORDER — OFLOXACIN 0.3 % OT SOLN: 5.0000 [drp] | Freq: Every day | OTIC | 0 refills | Status: DC

## 2021-03-17 MED ORDER — CIPROFLOXACIN HCL 0.2 % OT SOLN: 0.2000 mL | Freq: Two times a day (BID) | OTIC | 0 refills | Status: DC

## 2021-03-17 NOTE — Progress Notes (Signed)
   [name] is a 70 y.o. male who presents today for an office visit.  Assessment/Plan:  New/Acute Problems: Foreign body left ear canal Attempted removal with alligator forceps however we were unsuccessful today.  Will place referral urgently to ENT for removal.  He does have a small amount of surrounding purulence.  We will start Cipro drops for now.     Subjective:  HPI: CC of the Pt is ear pain. This started either 03/14/21-03/15/21. He states that it feels like there is something in his left ear, and states that the ear feels hot. The condition has been improving since then. He presented visible discomfort during the left ear exam. Blood noticed around lodged hearing aid part in left ear.           Objective:  Physical Exam: BP (!) 165/101   Pulse 77   Temp 98.3 F (36.8 C) (Temporal)   Ht 5\' 11"  (1.803 m)   Wt 238 lb (108 kg)   SpO2 95%   BMI 33.19 kg/m   Gen: No acute distress, resting comfortably HEENT: Retained part of hearing aid in left EAC.  Bloody discharge present. Neuro: Grossly normal, moves all extremities Psych: Normal affect and thought content  Procedure Note Verbal consent was obtained. The foreign body was visualized.  Approximately 1 cc was infiltrated into his left EAC for anesthesia.  At this point for foreign body was attempted to be removed with alligator forceps.  After several attempts we were not successful in removing this foreign body.  He had minimal pain.  No blood loss.  No complications.     I,Jordan Kelly,acting as a Education administrator for Dimas Chyle, MD.,have documented all relevant documentation on the behalf of Dimas Chyle, MD,as directed by  Dimas Chyle, MD while in the presence of Dimas Chyle, MD.  I, Dimas Chyle, MD, have reviewed all documentation for this visit. The documentation on 03/17/21 for the exam, diagnosis, procedures, and orders are all accurate and complete.  Time Spent: 45 minutes of total time was spent on the date of  the encounter performing the following actions: chart review prior to seeing the patient, obtaining history, performing a medically necessary exam, counseling on the treatment plan including need for foreign body removal, placing orders, and documenting in our EHR.    [name]. Jerline Pain, MD 03/17/2021 12:14 PM

## 2021-03-17 NOTE — Patient Instructions (Signed)
It was very nice to see you today!  You have part of your hearing aid stuck in your left ear.  We were not able to remove this today.  I will place a referral for you to see the ear nose and throat doctor urgently to have this removed.  Please start the ciprofloxacin drops to prevent infection.  Take care, Dr Jerline Pain  PLEASE NOTE:  If you had any lab tests please let us know if you have not heard back within a few days. You may see your results on mychart before we have a chance to review them but we will give you a call once they are reviewed by [place]. If we ordered any referrals today, please let us know if you have not heard from their office within the next week.   Please try these tips to maintain a healthy lifestyle:  Eat at least 3 REAL meals and 1-2 snacks per day.  Aim for no more than 5 hours between eating.  If you eat breakfast, please do so within one hour of getting up.   Each meal should contain half fruits/vegetables, one quarter protein, and one quarter carbs (no bigger than a computer mouse)  Cut down on sweet beverages. This includes juice, soda, and sweet tea.   Drink at least 1 glass of water with each meal and aim for at least 8 glasses per day  Exercise at least 150 minutes every week.

## 2021-03-18 ENCOUNTER — Telehealth: Payer: Self-pay

## 2021-03-18 NOTE — Telephone Encounter (Signed)
Noted  

## 2021-03-18 NOTE — Telephone Encounter (Signed)
I sent in an alternative to his pharmacy yesterday.  [name]. Jerline Pain, MD 03/18/2021 10:13 AM

## 2021-03-18 NOTE — Telephone Encounter (Signed)
See note

## 2021-03-18 NOTE — Telephone Encounter (Signed)
Pt called regarding a prescription for Ciprofloxacin. He stated that he does not want to spend that much money on the prescription. He would like to know if Dr Jerline Pain can prescribe something else and won't cost as much. Please Advise.

## 2021-03-19 DIAGNOSIS — H60332 Swimmer's ear, left ear: Secondary | ICD-10-CM | POA: Diagnosis not present

## 2021-03-19 DIAGNOSIS — T162XXA Foreign body in left ear, initial encounter: Secondary | ICD-10-CM | POA: Diagnosis not present

## 2021-04-27 HISTORY — PX: GANGLION CYST EXCISION: SHX1691

## 2021-05-21 DIAGNOSIS — M25532 Pain in left wrist: Secondary | ICD-10-CM | POA: Diagnosis not present

## 2021-05-21 DIAGNOSIS — M13832 Other specified arthritis, left wrist: Secondary | ICD-10-CM | POA: Diagnosis not present

## 2021-05-21 DIAGNOSIS — G5603 Carpal tunnel syndrome, bilateral upper limbs: Secondary | ICD-10-CM | POA: Diagnosis not present

## 2021-05-22 DIAGNOSIS — G5622 Lesion of ulnar nerve, left upper limb: Secondary | ICD-10-CM | POA: Diagnosis not present

## 2021-05-28 DIAGNOSIS — M13832 Other specified arthritis, left wrist: Secondary | ICD-10-CM | POA: Diagnosis not present

## 2021-05-30 DIAGNOSIS — B354 Tinea corporis: Secondary | ICD-10-CM | POA: Diagnosis not present

## 2021-05-30 DIAGNOSIS — B353 Tinea pedis: Secondary | ICD-10-CM | POA: Diagnosis not present

## 2021-05-30 DIAGNOSIS — Z872 Personal history of diseases of the skin and subcutaneous tissue: Secondary | ICD-10-CM | POA: Diagnosis not present

## 2021-06-02 ENCOUNTER — Encounter (HOSPITAL_BASED_OUTPATIENT_CLINIC_OR_DEPARTMENT_OTHER): Payer: Self-pay | Admitting: Orthopedic Surgery

## 2021-06-02 ENCOUNTER — Other Ambulatory Visit: Payer: Self-pay

## 2021-06-02 NOTE — Progress Notes (Signed)
Spoke w/ via phone for pre-op interview--- [name] and wife [name] needs dos----      NONE         Ellis results------Current EKG in Epic dated 11/2020 COVID test -----patient states asymptomatic no test needed Arrive at -------1130 NPO after MN NO Solid Food.  Clear liquids from MN until---1030 Med rec completed Medications to take morning of surgery -----Bring Albuterol inhaler Diabetic medication ----- Patient instructed no nail polish to be worn day of surgery Patient instructed to bring photo id and insurance card day of surgery Patient aware to have Driver (ride ) / caregiver  [name]  for 24 hours after surgery  Patient Special Instructions ----- Pre-Op special Istructions ----- Patient verbalized understanding of instructions that were given at this phone interview. Patient denies shortness of breath, chest pain, fever, cough at this phone interview.

## 2021-06-02 NOTE — H&P (Signed)
Preoperative History & Physical Exam  Surgeon: Matt Holmes, MD  Diagnosis: LEFT Wrist Arthritis  Planned Procedure: Procedure(s) (LRB): ARTHRODESIS WRIST (Left)  History of Present Illness:   Patient is a 71 y.o. male with symptoms consistent with LEFT Wrist Arthritis who presents for surgical intervention. The risks, benefits and alternatives of surgical intervention were discussed and informed consent was obtained prior to surgery.  Past Medical History:  Past Medical History:  Diagnosis Date   Anxiety    Arthritis    Asthma    At risk for sleep apnea    STOP-BANG = 4  SENT TO PCP 05-24-2013   Eczema    Edema of foot    Finger wound, simple, open    left middle index finger - stitches and dressing occured on 09/22/2016- followed by Dr Daws at [facility]    H/O hiatal hernia    History of kidney stones    HOH (hard of hearing)    Hyperlipidemia    Influenza A    Influenza B    symptoms started 05-16-2013/  dx 05-17-2013 by pcp   Left ankle injury    twisted left ankle    Left ureteral calculus    Pneumonia    hx of    Rash    right leg- calf -    Wears dentures    bottom    Past Surgical History:  Past Surgical History:  Procedure Laterality Date   ANTERIOR CERVICAL DECOMP/DISCECTOMY FUSION  01-27-2007   C6  --- C7   COLONOSCOPY     CYSTOSCOPY WITH URETEROSCOPY Left 05/26/2013   Procedure: CYSTOSCOPY WITH URETEROSCOPY, URETHERAL DILATATION,LEFT RETROGRADE, LASER LITHOTRIPSY, STENT PLACMENT, LEFT URETEROSCOPY;  Surgeon: Ailene Rud, MD;  Location: [place];  Service: Urology;  Laterality: Left;   FINGER FRACTURE SURGERY  AS CHILD   HOLMIUM LASER APPLICATION Left 6/57/8469   Procedure: HOLMIUM LASER APPLICATION;  Surgeon: Ailene Rud, MD;  Location: [facility];  Service: Urology;  Laterality: Left;   LIPOMA EXCISION  05/17/2012   Procedure: EXCISION LIPOMA;  Surgeon: Gayland Curry, MD,FACS;  Location: [place];  Service: General;  Laterality: Left;  incisional biopsy of left shoulder soft tissue mass   NASAL FRACTURE SURGERY  AS CHILD   ORIF ULNAR FRACTURE Right AS CHILD   ROTATOR CUFF REPAIR     right shoulder June2018 by [place] Right 12/05/2018   Procedure: LEFT ARTHROSCOPY, CHONDROPLASTY;  Surgeon: Gaynelle Arabian, MD;  [facility];  Service: Orthopedics;  Laterality: Right;  63min   TOTAL KNEE REVISION Right 12/11/2020   Procedure: Right knee polyethylene revision;  Surgeon: Gaynelle Arabian, MD;  [facility];  Service: Orthopedics;  Laterality: Right;    Medications:  Prior to Admission medications   Medication Sig Start Date End Date Taking? Authorizing Provider  albuterol (PROAIR HFA) 108 (90 Base) MCG/ACT inhaler Inhale 2 puffs into the lungs every 6 (six) hours as needed for wheezing or shortness of breath. 10/10/19   Vivi Barrack, MD  Ascorbic Acid (VITAMIN C) 1000 MG tablet Take 1,000 mg by mouth 2 (two) times daily.    [provider]  Ciprofloxacin HCl 0.2 % otic solution Place 0.2 mLs into the left ear 2 (two) times daily. 03/17/21   Vivi Barrack, MD  Cyanocobalamin (VITAMIN B-12) 1000 MCG/15ML LIQD Take 1,000 mcg by mouth daily.    [provider]  ofloxacin (FLOXIN) 0.3 % OTIC solution Place 5 drops into the left ear daily. 03/17/21   Vivi Barrack, MD    Allergies:  Sesame oil and Tobramycin  Review of Systems: Negative except per HPI.  Physical Exam: Alert and oriented, NAD Head and neck: no masses, normal alignment CV: pulse intact Pulm: no increased work of breathing, respirations even and unlabored Abdomen: non-distended Extremities: extremities warm and well perfused  LABS: No results found for this or any previous visit (from the past 2160 hour(s)).   Complete History and Physical exam available in the office notes  Orene Desanctis

## 2021-06-09 ENCOUNTER — Ambulatory Visit (HOSPITAL_BASED_OUTPATIENT_CLINIC_OR_DEPARTMENT_OTHER): Admission: RE | Admit: 2021-06-09 | Payer: PPO | Source: Home / Self Care | Admitting: Orthopedic Surgery

## 2021-06-09 DIAGNOSIS — M19032 Primary osteoarthritis, left wrist: Secondary | ICD-10-CM | POA: Diagnosis not present

## 2021-06-09 SURGERY — FUSION, JOINT, WRIST
Anesthesia: Regional | Site: Wrist | Laterality: Left

## 2021-06-11 DIAGNOSIS — M25532 Pain in left wrist: Secondary | ICD-10-CM | POA: Diagnosis not present

## 2021-06-18 ENCOUNTER — Other Ambulatory Visit: Payer: Self-pay | Admitting: Family Medicine

## 2021-06-18 DIAGNOSIS — M13832 Other specified arthritis, left wrist: Secondary | ICD-10-CM | POA: Diagnosis not present

## 2021-06-20 ENCOUNTER — Other Ambulatory Visit: Payer: Self-pay

## 2021-06-20 ENCOUNTER — Ambulatory Visit (INDEPENDENT_AMBULATORY_CARE_PROVIDER_SITE_OTHER): Payer: PPO | Admitting: Family Medicine

## 2021-06-20 ENCOUNTER — Encounter: Payer: Self-pay | Admitting: Family Medicine

## 2021-06-20 VITALS — BP 145/84 | HR 77 | Temp 98.7°F | Ht 71.0 in | Wt 235.4 lb

## 2021-06-20 DIAGNOSIS — J45909 Unspecified asthma, uncomplicated: Secondary | ICD-10-CM | POA: Diagnosis not present

## 2021-06-20 DIAGNOSIS — M1990 Unspecified osteoarthritis, unspecified site: Secondary | ICD-10-CM

## 2021-06-20 DIAGNOSIS — R739 Hyperglycemia, unspecified: Secondary | ICD-10-CM

## 2021-06-20 DIAGNOSIS — Z0001 Encounter for general adult medical examination with abnormal findings: Secondary | ICD-10-CM | POA: Diagnosis not present

## 2021-06-20 DIAGNOSIS — E7800 Pure hypercholesterolemia, unspecified: Secondary | ICD-10-CM

## 2021-06-20 DIAGNOSIS — L301 Dyshidrosis [pompholyx]: Secondary | ICD-10-CM

## 2021-06-20 LAB — COMPREHENSIVE METABOLIC PANEL
ALT: 19 U/L (ref 0–53)
AST: 19 U/L (ref 0–37)
Albumin: 4.3 g/dL (ref 3.5–5.2)
Alkaline Phosphatase: 54 U/L (ref 39–117)
BUN: 16 mg/dL (ref 6–23)
CO2: 33 mEq/L — ABNORMAL HIGH (ref 19–32)
Calcium: 9.8 mg/dL (ref 8.4–10.5)
Chloride: 104 mEq/L (ref 96–112)
Creatinine, Ser: 1.06 mg/dL (ref 0.40–1.50)
GFR: 70.29 mL/min (ref >60.00)
Glucose, Bld: 108 mg/dL — ABNORMAL HIGH (ref 70–99)
Potassium: 4.6 mEq/L (ref 3.5–5.1)
Sodium: 140 mEq/L (ref 135–145)
Total Bilirubin: 1 mg/dL (ref 0.2–1.2)
Total Protein: 7 g/dL (ref 6.0–8.3)

## 2021-06-20 LAB — LIPID PANEL
Cholesterol: 191 mg/dL (ref 0–200)
HDL: 32.7 mg/dL — ABNORMAL LOW (ref >39.00)
LDL Cholesterol: 132 mg/dL — ABNORMAL HIGH (ref 0–99)
NonHDL: 157.86
Total CHOL/HDL Ratio: 6
Triglycerides: 129 mg/dL (ref 0.0–149.0)
VLDL: 25.8 mg/dL (ref 0.0–40.0)

## 2021-06-20 LAB — CBC
HCT: 42.6 % (ref 39.0–52.0)
Hemoglobin: 14.5 g/dL (ref 13.0–17.0)
MCHC: 34.1 g/dL (ref 30.0–36.0)
MCV: 95.2 fl (ref 78.0–100.0)
Platelets: 307 10*3/uL (ref 150.0–400.0)
RBC: 4.47 Mil/uL (ref 4.22–5.81)
RDW: 12.8 % (ref 11.5–15.5)
WBC: 7.6 10*3/uL (ref 4.0–10.5)

## 2021-06-20 LAB — HEMOGLOBIN A1C: Hgb A1c MFr Bld: 5.5 % (ref 4.6–6.5)

## 2021-06-20 LAB — TSH: TSH: 2.39 u[IU]/mL (ref 0.35–5.50)

## 2021-06-20 NOTE — Assessment & Plan Note (Signed)
Continue management per orthopedics. 

## 2021-06-20 NOTE — Assessment & Plan Note (Signed)
Continue triamcinolone as needed.

## 2021-06-20 NOTE — Assessment & Plan Note (Signed)
Check lipids 

## 2021-06-20 NOTE — Patient Instructions (Signed)
It was very nice to see you today!  We will check blood work today.  Please come back to see [name] in 6 to 12 months.  Come back to see [name] sooner if needed.   Take care, Dr Jerline Pain  PLEASE NOTE:  If you had any lab tests please let [name] know if you have not heard back within a few days. You may see your results on mychart before we have a chance to review them but we will give you a call once they are reviewed by [name]. If we ordered any referrals today, please let [name] know if you have not heard from their office within the next week.   Please try these tips to maintain a healthy lifestyle:  Eat at least 3 REAL meals and 1-2 snacks per day.  Aim for no more than 5 hours between eating.  If you eat breakfast, please do so within one hour of getting up.   Each meal should contain half fruits/vegetables, one quarter protein, and one quarter carbs (no bigger than a computer mouse)  Cut down on sweet beverages. This includes juice, soda, and sweet tea.   Drink at least 1 glass of water with each meal and aim for at least 8 glasses per day  Exercise at least 150 minutes every week.    Preventive Care 89 Years and Older, Male Preventive care refers to lifestyle choices and visits with your health care provider that can promote health and wellness. Preventive care visits are also called wellness exams. What can I expect for my preventive care visit? Counseling During your preventive care visit, your health care provider may ask about your: Medical history, including: Past medical problems. Family medical history. History of falls. Current health, including: Emotional well-being. Home life and relationship well-being. Sexual activity. Memory and ability to understand (cognition). Lifestyle, including: Alcohol, nicotine or tobacco, and drug use. Access to firearms. Diet, exercise, and sleep habits. Work and work Statistician. Sunscreen use. Safety issues such as seatbelt and bike helmet  use. Physical exam Your health care provider will check your: Height and weight. These may be used to calculate your BMI (body mass index). BMI is a measurement that tells if you are at a healthy weight. Waist circumference. This measures the distance around your waistline. This measurement also tells if you are at a healthy weight and may help predict your risk of certain diseases, such as type 2 diabetes and high blood pressure. Heart rate and blood pressure. Body temperature. Skin for abnormal spots. What immunizations do I need? Vaccines are usually given at various ages, according to a schedule. Your health care provider will recommend vaccines for you based on your age, medical history, and lifestyle or other factors, such as travel or where you work. What tests do I need? Screening Your health care provider may recommend screening tests for certain conditions. This may include: Lipid and cholesterol levels. Diabetes screening. This is done by checking your blood sugar (glucose) after you have not eaten for a while (fasting). Hepatitis C test. Hepatitis B test. HIV (human immunodeficiency virus) test. STI (sexually transmitted infection) testing, if you are at risk. Lung cancer screening. Colorectal cancer screening. Prostate cancer screening. Abdominal aortic aneurysm (AAA) screening. You may need this if you are a current or former smoker. Talk with your health care provider about your test results, treatment options, and if necessary, the need for more tests. Follow these instructions at home: Eating and drinking  Eat a diet  that includes fresh fruits and vegetables, whole grains, lean protein, and low-fat dairy products. Limit your intake of foods with high amounts of sugar, saturated fats, and salt. Take vitamin and mineral supplements as recommended by your health care provider. Do not drink alcohol if your health care provider tells you not to drink. If you drink  alcohol: Limit how much you have to 0-2 drinks a day. Know how much alcohol is in your drink. In the [place] one drink equals one 12 oz bottle of beer (355 mL), one 5 oz glass of wine (148 mL), or one 1 oz glass of hard liquor (44 mL). Lifestyle Brush your teeth every morning and night with fluoride toothpaste. Floss one time each day. Exercise for at least 30 minutes 5 or more days each week. Do not use any products that contain nicotine or tobacco. These products include cigarettes, chewing tobacco, and vaping devices, such as e-cigarettes. If you need help quitting, ask your health care provider. Do not use drugs. If you are sexually active, practice safe sex. Use a condom or other form of protection to prevent STIs. Take aspirin only as told by your health care provider. Make sure that you understand how much to take and what form to take. Work with your health care provider to find out whether it is safe and beneficial for you to take aspirin daily. Ask your health care provider if you need to take a cholesterol-lowering medicine (statin). Find healthy ways to manage stress, such as: Meditation, yoga, or listening to music. Journaling. Talking to a trusted person. Spending time with friends and family. Safety Always wear your seat belt while driving or riding in a vehicle. Do not drive: If you have been drinking alcohol. Do not ride with someone who has been drinking. When you are tired or distracted. While texting. If you have been using any mind-altering substances or drugs. Wear a helmet and other protective equipment during sports activities. If you have firearms in your house, make sure you follow all gun safety procedures. Minimize exposure to UV radiation to reduce your risk of skin cancer. What's next? Visit your health care provider once a year for an annual wellness visit. Ask your health care provider how often you should have your eyes and teeth checked. Stay up to date  on all vaccines. This information is not intended to replace advice given to you by your health care provider. Make sure you discuss any questions you have with your health care provider. Document Revised: 10/09/2020 Document Reviewed: 10/09/2020 Elsevier Patient Education  [place].

## 2021-06-20 NOTE — Progress Notes (Signed)
Chief Complaint:  [name] is a 71 y.o. male who presents today for his annual comprehensive physical exam.    Assessment/Plan:  Chronic Problems Addressed Today: Dyshidrotic eczema Continue triamcinolone as needed.   Pure hypercholesterolemia Check lipids.   Moderate asthma without complication Doing well on albuterol as needed.   Osteoarthritis Continue management per orthopedics.  Preventative Healthcare: Check labs. Due for colonoscopy later this year.   Patient Counseling(The following topics were reviewed and/or handout was given):  -Nutrition: Stressed importance of moderation in sodium/caffeine intake, saturated fat and cholesterol, caloric balance, sufficient intake of fresh fruits, vegetables, and fiber.  -Stressed the importance of regular exercise.   -Substance Abuse: Discussed cessation/primary prevention of tobacco, alcohol, or other drug use; driving or other dangerous activities under the influence; availability of treatment for abuse.   -Injury prevention: Discussed safety belts, safety helmets, smoke detector, smoking near bedding or upholstery.   -Sexuality: Discussed sexually transmitted diseases, partner selection, use of condoms, avoidance of unintended pregnancy and contraceptive alternatives.   -Dental health: Discussed importance of regular tooth brushing, flossing, and dental visits.  -Health maintenance and immunizations reviewed. Please refer to Health maintenance section.  Return to care in 1 year for next preventative visit.     Subjective:  HPI:  He has no acute complaints today.  Recently saw orthopedics and had procedure done on his left wrist.  He has been doing well with this.    Lifestyle Diet: Cutting down on carbs and sugar.  Exercise: Tries to walk daily though limited due to recent surgeries.   Depression screen [facility] 2/9 11/27/2020  Decreased Interest 0  Down, Depressed, Hopeless 0  PHQ - 2 Score 0  Altered sleeping -  Tired,  decreased energy -  Change in appetite -  Feeling bad or failure about yourself  -  Trouble concentrating -  Moving slowly or fidgety/restless -  Suicidal thoughts -  PHQ-9 Score -  Difficult doing work/chores -   ROS: Per HPI, otherwise a complete review of systems was negative.   PMH:  The following were reviewed and entered/updated in epic: Past Medical History:  Diagnosis Date   Anxiety    Arthritis    Asthma    At risk for sleep apnea    STOP-BANG = 4  SENT TO PCP 05-24-2013   Eczema    Edema of foot    Finger wound, simple, open    left middle index finger - stitches and dressing occured on 09/22/2016- followed by Dr Daws at [facility]    H/O hiatal hernia    History of kidney stones    HOH (hard of hearing)    Hyperlipidemia    Influenza A    Influenza B    symptoms started 05-16-2013/  dx 05-17-2013 by pcp   Left ankle injury    twisted left ankle    Left ureteral calculus    Pneumonia    hx of    Rash    right leg- calf -    Wears dentures    bottom   Patient Active Problem List   Diagnosis Date Noted   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   Dermatitis 10/10/2019   Erectile dysfunction 10/10/2019   Osteoarthritis 12/05/2018   Moderate asthma without complication 57/84/6962   Eczema of lower leg 08/16/2017   Pure hypercholesterolemia 04/05/2013   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   NOISE-INDUCED HEARING LOSS 12/17/2008   Dyshidrotic eczema 12/08/2007  NEPHROLITHIASIS, HX OF 11/10/2007   Allergic rhinitis 06/24/2007   Past Surgical History:  Procedure Laterality Date   ANTERIOR CERVICAL DECOMP/DISCECTOMY FUSION  01-27-2007   C6  --- C7   COLONOSCOPY     CYSTOSCOPY WITH URETEROSCOPY Left 05/26/2013   Procedure: CYSTOSCOPY WITH URETEROSCOPY, URETHERAL DILATATION,LEFT RETROGRADE, LASER LITHOTRIPSY, STENT PLACMENT, LEFT URETEROSCOPY;  Surgeon: Ailene Rud, MD;  Location: [facility];  Service:  Urology;  Laterality: Left;   FINGER FRACTURE SURGERY  AS CHILD   HOLMIUM LASER APPLICATION Left 6/44/0347   Procedure: HOLMIUM LASER APPLICATION;  Surgeon: Ailene Rud, MD;  Location: [facility];  Service: Urology;  Laterality: Left;   LIPOMA EXCISION  05/17/2012   Procedure: EXCISION LIPOMA;  Surgeon: Gayland Curry, MD,FACS;  Location: [facility];  Service: General;  Laterality: Left;  incisional biopsy of left shoulder soft tissue mass   NASAL FRACTURE SURGERY  AS CHILD   ORIF ULNAR FRACTURE Right AS CHILD   ROTATOR CUFF REPAIR     right shoulder June2018 by [place] Right 12/05/2018   Procedure: LEFT ARTHROSCOPY, CHONDROPLASTY;  Surgeon: Gaynelle Arabian, MD;  [facility];  Service: Orthopedics;  Laterality: Right;  31min   TOTAL KNEE REVISION Right 12/11/2020   Procedure: Right knee polyethylene revision;  Surgeon: Gaynelle Arabian, MD;  [facility];  Service: Orthopedics;  Laterality: Right;    Family History  Problem Relation Age of Onset   Arthritis Other    Hypertension Other    Osteoporosis Other    Heart disease Other    Lung disease Other    Prostate cancer Father    Heart disease Sister    Colon cancer Neg Hx    Colon polyps Neg Hx    Esophageal cancer Neg Hx    Kidney disease Neg Hx    Rectal cancer Neg Hx    Stomach cancer Neg Hx     Medications- reviewed and updated Current Outpatient Medications  Medication Sig Dispense Refill   albuterol (VENTOLIN HFA) 108 (90 Base) MCG/ACT inhaler TAKE 2 PUFFS BY MOUTH EVERY 6 HOURS AS NEEDED FOR WHEEZE OR SHORTNESS OF BREATH 8.5 each 1   Ascorbic Acid (VITAMIN C) 1000 MG tablet Take 1,000 mg by mouth 2 (two) times daily.     Cyanocobalamin (VITAMIN B-12) 1000 MCG/15ML LIQD Take 1,000 mcg by mouth daily.     No current facility-administered medications for this visit.    Allergies-reviewed and updated Allergies  Allergen  Reactions   Sesame Oil Itching and Swelling   Tobramycin Itching and Swelling    Used in an eye oint-reaction    Social History   Socioeconomic History   Marital status: Married    Spouse name: Not on file   Number of children: 2   Years of education: Not on file   Highest education level: Not on file  Occupational History   Occupation: Hospital doctor  Tobacco Use   Smoking status: Never   Smokeless tobacco: Never  Vaping Use   Vaping Use: Never used  Substance and Sexual Activity   Alcohol use: Yes    Alcohol/week: 7.0 standard drinks    Types: 7 Cans of beer per week    Comment: beer- 2 beers day sometimes    Drug use: No   Sexual activity: Yes  Other Topics Concern   Not on file  Social History Narrative   New granddaughter  born 2020   Social Determinants of Health   Financial Resource Strain: Not on file  Food Insecurity: Not on file  Transportation Needs: Not on file  Physical Activity: Not on file  Stress: Not on file  Social Connections: Not on file        Objective:  Physical Exam: BP (!) 145/84 (BP Location: Right Arm)    Pulse 77    Temp 98.7 F (37.1 C) (Temporal)    Ht 5\' 11"  (1.803 m)    Wt 235 lb 6.4 oz (106.8 kg)    SpO2 97%    BMI 32.83 kg/m   Body mass index is 32.83 kg/m. Wt Readings from Last 3 Encounters:  06/20/21 235 lb 6.4 oz (106.8 kg)  03/17/21 238 lb (108 kg)  12/11/20 240 lb (108.9 kg)   Gen: NAD, resting comfortably HEENT: TMs normal bilaterally. OP clear. No thyromegaly noted.  CV: RRR with no murmurs appreciated Pulm: NWOB, CTAB with no crackles, wheezes, or rhonchi GI: Normal bowel sounds present. Soft, Nontender, Nondistended. MSK: no edema, cyanosis, or clubbing noted Skin: warm, dry Neuro: CN2-12 grossly intact. Strength 5/5 in upper and lower extremities. Reflexes symmetric and intact bilaterally.  Psych: Normal affect and thought content     Moss Berry M. Jerline Pain, MD 06/20/2021 11:43 AM

## 2021-06-20 NOTE — Assessment & Plan Note (Signed)
Doing well on albuterol as needed.

## 2021-06-23 ENCOUNTER — Telehealth: Payer: Self-pay | Admitting: Family Medicine

## 2021-06-23 DIAGNOSIS — M25532 Pain in left wrist: Secondary | ICD-10-CM | POA: Diagnosis not present

## 2021-06-23 NOTE — Telephone Encounter (Signed)
Pt verified DOB, advised this isn't a medication that is typically refilled and was originally filled 1 yr and half ago. Per pt only uses as needed at bedtime when dealing with cough and allergies. Advised he should schedule an appt as recommended by provider if having issues with cough and need further evaluation. Pt verbalized understanding

## 2021-06-23 NOTE — Progress Notes (Signed)
Please inform patient of the following:  Cholesterol is mildly elevated. He would benefit from starting cholesterol medication to improve numbers and lower risk of heart attack and stroke.  Please send in Lipitor 40 mg daily if he is willing to start.  Regardless, he should continue working on diet and exercise and we can recheck in a year.  Everything else is NORMAL.

## 2021-06-23 NOTE — Telephone Encounter (Signed)
Please advise 

## 2021-06-23 NOTE — Telephone Encounter (Signed)
Can we get some clarification? Is he having a cough? This is typically not a medication we refill. We last sent it in a year and a half ago. Since it is a controlled substance we havent sent in for over a year, he will need an office visit to refill even though he was just here for his physical.  [name]. Jerline Pain, MD 06/23/2021 12:26 PM

## 2021-06-23 NOTE — Telephone Encounter (Signed)
. °  Encourage patient to contact the pharmacy for refills or they can request refills through [facility]:  06/20/21  NEXT APPOINTMENT DATE: N/A  MEDICATION:HYDROcodone-homatropine (HYCODAN) 5-1.5 MG/5ML syrup [[id number]]  Is the patient out of medication? yes  PHARMACY: [id number] - [place], [place] - [address] AT [place] Phone:  [phone]  Fax:  [phone]      Let patient know to contact pharmacy at the end of the day to make sure medication is ready.  Please notify patient to allow 48-72 hours to process

## 2021-06-24 ENCOUNTER — Telehealth: Payer: Self-pay | Admitting: Family Medicine

## 2021-06-24 NOTE — Telephone Encounter (Signed)
Copied from [place] [phone]. Topic: Medicare AWV >> Jun 24, 2021 10:12 AM Harris-Coley, Hannah Beat wrote: Reason for CRM: Left message for patient to schedule Annual Wellness Visit.  Please schedule with Nurse Health Advisor Charlott Rakes, RN at [facility].  Please call [phone] ask for [facility]

## 2021-06-27 DIAGNOSIS — M13832 Other specified arthritis, left wrist: Secondary | ICD-10-CM | POA: Diagnosis not present

## 2021-07-24 DIAGNOSIS — M13832 Other specified arthritis, left wrist: Secondary | ICD-10-CM | POA: Diagnosis not present

## 2021-09-08 DIAGNOSIS — M13832 Other specified arthritis, left wrist: Secondary | ICD-10-CM | POA: Diagnosis not present

## 2021-10-03 ENCOUNTER — Encounter: Payer: Self-pay | Admitting: Gastroenterology

## 2021-11-10 ENCOUNTER — Other Ambulatory Visit: Payer: Self-pay | Admitting: Family Medicine

## 2021-11-12 ENCOUNTER — Other Ambulatory Visit: Payer: Self-pay | Admitting: Family Medicine

## 2021-11-12 ENCOUNTER — Telehealth: Payer: Self-pay | Admitting: Family Medicine

## 2021-11-12 NOTE — Telephone Encounter (Signed)
Called patient to scheduled AWV but declined.   He stated he needs his prednisone refilled but was told by the pharmacy it was denied and that he needed an appointment.   He would like to know why this is since he was just seen in Feb 2023.

## 2021-11-13 NOTE — Telephone Encounter (Signed)
Can we clarify with patient? He was supposed to wean off this last year. It is no longer on his medication list.  [name]. Jerline Pain, MD 11/13/2021 11:38 AM

## 2021-11-13 NOTE — Telephone Encounter (Signed)
See note

## 2021-11-13 NOTE — Telephone Encounter (Signed)
Spoke with patient, patient stated he stop taking Rx Prednisone due to surgeries  Stated been taking Rx for join pain, after recuperating form two surgeries want to go back  Can Rx Prednisone be refill

## 2021-11-14 MED ORDER — PREDNISONE 10 MG PO TABS: ORAL_TABLET | ORAL | 3 refills | Status: DC

## 2021-11-17 DIAGNOSIS — D225 Melanocytic nevi of trunk: Secondary | ICD-10-CM | POA: Diagnosis not present

## 2021-11-17 DIAGNOSIS — L814 Other melanin hyperpigmentation: Secondary | ICD-10-CM | POA: Diagnosis not present

## 2021-11-17 DIAGNOSIS — L821 Other seborrheic keratosis: Secondary | ICD-10-CM | POA: Diagnosis not present

## 2021-12-26 ENCOUNTER — Telehealth: Payer: Self-pay | Admitting: *Deleted

## 2021-12-26 NOTE — Patient Outreach (Signed)
  Care Coordination   12/26/2021 Name: [name] MRN: [id number] DOB: [birth year]   Care Coordination Outreach Attempts:  An unsuccessful telephone outreach was attempted today to offer the patient information about available care coordination services as a benefit of their health plan.   Follow Up Plan:  Additional outreach attempts will be made to offer the patient care coordination information and services.   Encounter Outcome:  No Answer  Care Coordination Interventions Activated:  No   Care Coordination Interventions:  No, not indicated    Raina Mina, RN Care Management Coordinator [place] Office [phone]

## 2022-01-19 ENCOUNTER — Encounter: Payer: Self-pay | Admitting: *Deleted

## 2022-01-21 ENCOUNTER — Telehealth: Payer: Self-pay

## 2022-01-21 NOTE — Patient Outreach (Signed)
  Care Coordination   01/21/2022 Name: ANWAR SAKATA MRN: [id number] DOB: 1950/06/05   Care Coordination Outreach Attempts:  A second unsuccessful outreach was attempted today to offer the patient with information about available care coordination services as a benefit of their health plan.     Follow Up Plan:  Additional outreach attempts will be made to offer the patient care coordination information and services.   Encounter Outcome:  No Answer  Care Coordination Interventions Activated:  No   Care Coordination Interventions:  No, not indicated   Peter Garter RN, BSN,CCM, [facility] [phone]

## 2022-01-22 DIAGNOSIS — Z96651 Presence of right artificial knee joint: Secondary | ICD-10-CM | POA: Diagnosis not present

## 2022-01-28 ENCOUNTER — Telehealth: Payer: Self-pay

## 2022-01-28 NOTE — Patient Outreach (Signed)
  Care Coordination   Initial Visit Note   01/28/2022 Name: [name] MRN: [id number] DOB: June 21, 1950  [name] is a 71 y.o. year old male who sees Vivi Barrack, MD for primary care. I spoke with  [name] by phone today. Declines to give HIPAA  What matters to the patients health and wellness today?  I do not need anything    Goals Addressed   None     SDOH assessments and interventions completed:  No     Care Coordination Interventions Activated:  No  Care Coordination Interventions:  No, not indicated   Follow up plan: No further intervention required.   Encounter Outcome:  Pt. Refused  Peter Garter RN, Jackquline Denmark, [facility] [phone]

## 2022-02-05 ENCOUNTER — Encounter: Payer: Self-pay | Admitting: Gastroenterology

## 2022-03-09 ENCOUNTER — Ambulatory Visit (AMBULATORY_SURGERY_CENTER): Payer: Self-pay

## 2022-03-09 VITALS — Ht 71.0 in | Wt 239.0 lb

## 2022-03-09 DIAGNOSIS — Z86010 Personal history of colonic polyps: Secondary | ICD-10-CM

## 2022-03-09 MED ORDER — NA SULFATE-K SULFATE-MG SULF 17.5-3.13-1.6 GM/177ML PO SOLN: 1.0000 | Freq: Once | ORAL | 0 refills | Status: AC

## 2022-03-09 NOTE — Progress Notes (Signed)

## 2022-03-12 DIAGNOSIS — L0101 Non-bullous impetigo: Secondary | ICD-10-CM | POA: Diagnosis not present

## 2022-03-12 DIAGNOSIS — L239 Allergic contact dermatitis, unspecified cause: Secondary | ICD-10-CM | POA: Diagnosis not present

## 2022-03-12 DIAGNOSIS — L298 Other pruritus: Secondary | ICD-10-CM | POA: Diagnosis not present

## 2022-03-16 DIAGNOSIS — Z23 Encounter for immunization: Secondary | ICD-10-CM | POA: Diagnosis not present

## 2022-04-08 ENCOUNTER — Encounter: Payer: PPO | Admitting: Gastroenterology

## 2022-04-09 ENCOUNTER — Encounter: Payer: Self-pay | Admitting: *Deleted

## 2022-04-09 ENCOUNTER — Encounter: Payer: Self-pay | Admitting: Family Medicine

## 2022-04-09 ENCOUNTER — Ambulatory Visit (INDEPENDENT_AMBULATORY_CARE_PROVIDER_SITE_OTHER): Payer: PPO | Admitting: Family Medicine

## 2022-04-09 VITALS — BP 121/71 | HR 77 | Ht 68.5 in | Wt 237.4 lb

## 2022-04-09 DIAGNOSIS — M1990 Unspecified osteoarthritis, unspecified site: Secondary | ICD-10-CM

## 2022-04-09 DIAGNOSIS — M533 Sacrococcygeal disorders, not elsewhere classified: Secondary | ICD-10-CM | POA: Diagnosis not present

## 2022-04-09 DIAGNOSIS — M47816 Spondylosis without myelopathy or radiculopathy, lumbar region: Secondary | ICD-10-CM | POA: Insufficient documentation

## 2022-04-09 MED ORDER — METHYLPREDNISOLONE ACETATE 40 MG/ML IJ SUSP
40.0000 mg | Freq: Once | INTRAMUSCULAR | Status: AC
  Administered 2022-04-09: 40 mg via INTRAMUSCULAR

## 2022-04-09 NOTE — Patient Instructions (Addendum)
Try lift in right shoe.  Start exercises for back.  Let me know if not improving with this.  See me again in 4-6 months for annual exam and updated labs.

## 2022-04-09 NOTE — Assessment & Plan Note (Signed)
Given IM injection of Depo-Medrol 40 mg today.  Given handout for home exercises.  He does have a leg length discrepancy on the right which may be contributing.  Given lift to try his shoe to see if this helps with any of his symptoms.

## 2022-04-09 NOTE — Progress Notes (Signed)
[name] - 71 y.o. male MRN [id number]  Date of birth: January 22, 1951  Subjective Chief Complaint  Patient presents with   Establish Care   Hip Pain    HPI [name] is a 71 year old male here today for initial visit to establish care.  Overall he has been in pretty good health.  He has fatigue with repeated problems, especially pertaining to the right knee.  Still some intermittent swelling on the knee.  Currently complains of pain in the lower back area and right buttock area.  This started about a week ago.  Does not recall any known injury or overuse.  Pain is worse when going from sitting to standing.  It does worsen some when sitting for prolonged periods and when walking for prolonged periods.  He denies radiation into the legs, numbness, tingling or weakness.  ROS:  A comprehensive ROS was completed and negative except as noted per HPI  Allergies  Allergen Reactions   Sesame Oil Itching and Swelling   Tobramycin Itching and Swelling    Used in an eye oint-reaction    Past Medical History:  Diagnosis Date   Allergy    Anxiety    Arthritis    Asthma    At risk for sleep apnea    STOP-BANG = 4  SENT TO PCP 05-24-2013   Eczema    Edema of foot    Finger wound, simple, open    left middle index finger - stitches and dressing occured on 09/22/2016- followed by Dr Daws at [facility]    H/O hiatal hernia    History of kidney stones    HOH (hard of hearing)    Hyperlipidemia    Influenza A    Influenza B    symptoms started 05-16-2013/  dx 05-17-2013 by pcp   Left ankle injury    twisted left ankle    Left ureteral calculus    Pneumonia    hx of    Rash    right leg- calf -    Wears dentures    bottom    Past Surgical History:  Procedure Laterality Date   ANTERIOR CERVICAL DECOMP/DISCECTOMY FUSION  01/27/2007   C6  --- C7   COLONOSCOPY  08/25/2016   TA   CYSTOSCOPY WITH URETEROSCOPY Left 05/26/2013   Procedure: CYSTOSCOPY WITH URETEROSCOPY, URETHERAL  DILATATION,LEFT RETROGRADE, LASER LITHOTRIPSY, STENT PLACMENT, LEFT URETEROSCOPY;  Surgeon: Ailene Rud, MD;  Location: [place];  Service: Urology;  Laterality: Left;   FINGER FRACTURE SURGERY  AS CHILD   GANGLION CYST EXCISION Left 2023   HOLMIUM LASER APPLICATION Left 88/41/6606   Procedure: HOLMIUM LASER APPLICATION;  Surgeon: Ailene Rud, MD;  Location: [facility];  Service: Urology;  Laterality: Left;   LIPOMA EXCISION  05/17/2012   Procedure: EXCISION LIPOMA;  Surgeon: Gayland Curry, MD,FACS;  Location: [place];  Service: General;  Laterality: Left;  incisional biopsy of left shoulder soft tissue mass   NASAL FRACTURE SURGERY  AS CHILD   ORIF ULNAR FRACTURE Right AS CHILD   ROTATOR CUFF REPAIR Right    right shoulder June2018 by [place] Right 12/05/2018   Procedure: LEFT ARTHROSCOPY, CHONDROPLASTY;  Surgeon: Gaynelle Arabian, MD;  Location: [facility];  Service: Orthopedics;  Laterality: Right;  51mn   TOTAL KNEE REVISION Right 12/11/2020   Procedure: Right knee polyethylene revision;  Surgeon: AGaynelle Arabian MD;  Location:  [facility];  Service: Orthopedics;  Laterality: Right;    Social History   Socioeconomic History   Marital status: Married    Spouse name: Not on file   Number of children: 2   Years of education: Not on file   Highest education level: Not on file  Occupational History   Occupation: Hospital doctor  Tobacco Use   Smoking status: Never    Passive exposure: Never   Smokeless tobacco: Never  Vaping Use   Vaping Use: Never used  Substance and Sexual Activity   Alcohol use: Yes    Alcohol/week: 21.0 standard drinks of alcohol    Types: 21 Cans of beer per week    Comment: beer- 2 beers day sometimes    Drug use: Never   Sexual activity: Not Currently    Partners: Female  Other Topics Concern   Not on file  Social History  Narrative   New granddaughter born 2020   Social Determinants of Health   Financial Resource Strain: Not on file  Food Insecurity: Not on file  Transportation Needs: Not on file  Physical Activity: Not on file  Stress: Not on file  Social Connections: Not on file    Family History  Problem Relation Age of Onset   Arthritis Other    Hypertension Other    Osteoporosis Other    Heart disease Other    Lung disease Other    Prostate cancer Father    Heart disease Sister    Colon cancer Neg Hx    Colon polyps Neg Hx    Esophageal cancer Neg Hx    Kidney disease Neg Hx    Rectal cancer Neg Hx    Stomach cancer Neg Hx     Health Maintenance  Topic Date Due   Medicare Annual Wellness (AWV)  05/10/2022 (Originally 05/15/2020)   COVID-19 Vaccine (5 - 2023-24 season) 05/28/2022 (Originally 12/26/2021)   Hepatitis C Screening  06/20/2022 (Originally 04/25/1967)   Zoster Vaccines- Shingrix (1 of 2) 07/09/2022 (Originally 04/24/1968)   COLONOSCOPY (Pts 45-3yr Insurance coverage will need to be confirmed)  04/10/2023 (Originally 09/03/2021)   DTaP/Tdap/Td (5 - Td or Tdap) 09/23/2026   Pneumonia Vaccine 71 Years old  Completed   INFLUENZA VACCINE  Completed   HPV VACCINES  Aged Out     ----------------------------------------------------------------------------------------------------------------------------------------------------------------------------------------------------------------- Physical Exam BP 121/71 (BP Location: Left Arm, Patient Position: Sitting, Cuff Size: Large)   Pulse 77   Ht 5' 8.5" (1.74 m)   Wt 237 lb 6.4 oz (107.7 kg)   SpO2 97%   BMI 35.57 kg/m   Physical Exam Constitutional:      Appearance: Normal appearance.  HENT:     Head: Normocephalic and atraumatic.  Eyes:     General: No scleral icterus. Pulmonary:     Breath sounds: No rales.  Musculoskeletal:     Comments: Tenderness to palpation along the right SI joint.  One half  inch leg length  discrepancy with shortening on the right compared to the left.  Skin:    General: Skin is warm and dry.  Neurological:     General: No focal deficit present.     Mental Status: He is alert.  Psychiatric:        Mood and Affect: Mood normal.        Behavior: Behavior normal.     ------------------------------------------------------------------------------------------------------------------------------------------------------------------------------------------------------------------- Assessment and Plan  Sacroiliac joint dysfunction of right side Given IM injection of Depo-Medrol 40 mg today.  Given handout  for home exercises.  He does have a leg length discrepancy on the right which may be contributing.  Given lift to try his shoe to see if this helps with any of his symptoms.   Meds ordered this encounter  Medications   methylPREDNISolone acetate (DEPO-MEDROL) injection 40 mg    Return in about 4 months (around 08/09/2022) for Annual exam/fasting labs.    This visit occurred during the SARS-CoV-2 public health emergency.  Safety protocols were in place, including screening questions prior to the visit, additional usage of staff PPE, and extensive cleaning of exam room while observing appropriate contact time as indicated for disinfecting solutions.

## 2022-05-07 ENCOUNTER — Encounter: Payer: Self-pay | Admitting: Gastroenterology

## 2022-05-12 ENCOUNTER — Ambulatory Visit (AMBULATORY_SURGERY_CENTER): Payer: PPO | Admitting: Gastroenterology

## 2022-05-12 ENCOUNTER — Encounter: Payer: Self-pay | Admitting: Gastroenterology

## 2022-05-12 VITALS — BP 142/80 | HR 60 | Temp 98.6°F | Resp 17 | Ht 71.0 in | Wt 239.0 lb

## 2022-05-12 DIAGNOSIS — D120 Benign neoplasm of cecum: Secondary | ICD-10-CM | POA: Diagnosis not present

## 2022-05-12 DIAGNOSIS — Z09 Encounter for follow-up examination after completed treatment for conditions other than malignant neoplasm: Secondary | ICD-10-CM

## 2022-05-12 DIAGNOSIS — F419 Anxiety disorder, unspecified: Secondary | ICD-10-CM | POA: Diagnosis not present

## 2022-05-12 DIAGNOSIS — Z86010 Personal history of colonic polyps: Secondary | ICD-10-CM | POA: Diagnosis not present

## 2022-05-12 DIAGNOSIS — J45909 Unspecified asthma, uncomplicated: Secondary | ICD-10-CM | POA: Diagnosis not present

## 2022-05-12 MED ORDER — SODIUM CHLORIDE 0.9 % IV SOLN: 500.0000 mL | Freq: Once | INTRAVENOUS | Status: DC

## 2022-05-12 NOTE — Patient Instructions (Addendum)
Repeat colonoscopy vs no colonoscopy due to age after studies complete for surveillance based on pathology result.s  Await pathology results.  Resume previous diet and medications.    YOU HAD AN ENDOSCOPIC PROCEDURE TODAY AT [facility]:   Refer to the procedure report that was given to you for any specific questions about what was found during the examination.  If the procedure report does not answer your questions, please call your gastroenterologist to clarify.  If you requested that your care partner not be given the details of your procedure findings, then the procedure report has been included in a sealed envelope for you to review at your convenience later.  YOU SHOULD EXPECT: Some feelings of bloating in the abdomen. Passage of more gas than usual.  Walking can help get rid of the air that was put into your GI tract during the procedure and reduce the bloating. If you had a lower endoscopy (such as a colonoscopy or flexible sigmoidoscopy) you may notice spotting of blood in your stool or on the toilet paper. If you underwent a bowel prep for your procedure, you may not have a normal bowel movement for a few days.  Please Note:  You might notice some irritation and congestion in your nose or some drainage.  This is from the oxygen used during your procedure.  There is no need for concern and it should clear up in a day or so.  SYMPTOMS TO REPORT IMMEDIATELY:  Following lower endoscopy (colonoscopy or flexible sigmoidoscopy):  Excessive amounts of blood in the stool  Significant tenderness or worsening of abdominal pains  Swelling of the abdomen that is new, acute  Fever of 100F or higher   For urgent or emergent issues, a gastroenterologist can be reached at any hour by calling [phone]. Do not use MyChart messaging for urgent concerns.    DIET:  We do recommend a small meal at first, but then you may proceed to your regular diet.  Drink plenty of fluids but  you should avoid alcoholic beverages for 24 hours.  ACTIVITY:  You should plan to take it easy for the rest of today and you should NOT DRIVE or use heavy machinery until tomorrow (because of the sedation medicines used during the test).    FOLLOW UP: Our staff will call the number listed on your records the next business day following your procedure.  We will call around 7:15- 8:00 am to check on you and address any questions or concerns that you may have regarding the information given to you following your procedure. If we do not reach you, we will leave a message.     If any biopsies were taken you will be contacted by phone or by letter within the next 1-3 weeks.  Please call us at [phone] if you have not heard about the biopsies in 3 weeks.    SIGNATURES/CONFIDENTIALITY: You and/or your care partner have signed paperwork which will be entered into your electronic medical record.  These signatures attest to the fact that that the information above on your After Visit Summary has been reviewed and is understood.  Full responsibility of the confidentiality of this discharge information lies with you and/or your care-partner.

## 2022-05-12 NOTE — Progress Notes (Signed)
Pt's states no medical or surgical changes since previsit or office visit. 

## 2022-05-12 NOTE — Op Note (Signed)
[name]: [name] Procedure Date: 05/12/2022 8:27 AM MRN: [id number] Endoscopist: Ladene Artist , MD, [id number] Age: 72 Referring MD:  Date of Birth: 1950-05-10 Gender: Male Account #: [id number] Procedure:                Colonoscopy Indications:              Surveillance: Personal history of adenomatous                            polyps on last colonoscopy 5 years ago Medicines:                Monitored Anesthesia Care Procedure:                Pre-Anesthesia Assessment:                           - Prior to the procedure, a History and Physical                            was performed, and patient medications and                            allergies were reviewed. The patient's tolerance of                            previous anesthesia was also reviewed. The risks                            and benefits of the procedure and the sedation                            options and risks were discussed with the patient.                            All questions were answered, and informed consent                            was obtained. Prior Anticoagulants: The patient has                            taken no anticoagulant or antiplatelet agents. ASA                            Grade Assessment: II - A patient with mild systemic                            disease. After reviewing the risks and benefits,                            the patient was deemed in satisfactory condition to                            undergo the procedure.  After obtaining informed consent, the colonoscope                            was passed under direct vision. Throughout the                            procedure, the patient's blood pressure, pulse, and                            oxygen saturations were monitored continuously. The                            CF [id number] #[id number] was introduced through the anus                            and advanced to the the  cecum, identified by                            appendiceal orifice and ileocecal valve. The                            ileocecal valve, appendiceal orifice, and rectum                            were photographed. The quality of the bowel                            preparation was good. The colonoscopy was performed                            without difficulty. The patient tolerated the                            procedure well. Scope In: 8:39:02 AM Scope Out: 8:52:31 AM Scope Withdrawal Time: 0 hours 10 minutes 0 seconds  Total Procedure Duration: 0 hours 13 minutes 29 seconds  Findings:                 The perianal and digital rectal examinations were                            normal.                           A 7 mm polyp was found in the cecum. The polyp was                            sessile. The polyp was removed with a cold snare.                            Resection and retrieval were complete.                           Multiple medium-mouthed diverticula were found in  the entire colon. There was no evidence of                            diverticular bleeding.                           The exam was otherwise without abnormality on                            direct and retroflexion views. Complications:            No immediate complications. Estimated blood loss:                            None. Estimated Blood Loss:     Estimated blood loss: none. Impression:               - One 7 mm polyp in the cecum, removed with a cold                            snare. Resected and retrieved.                           - Moderate diverticulosis in the entire examined                            colon.                           - The examination was otherwise normal on direct                            and retroflexion views. Recommendation:           - Repeat colonoscopy vs no repeat due to age after                            studies are complete for  surveillance based on                            pathology results.                           - Patient has a contact number available for                            emergencies. The signs and symptoms of potential                            delayed complications were discussed with the                            patient. Return to normal activities tomorrow.                            Written discharge instructions were provided to the  patient.                           - High fiber diet.                           - Continue present medications.                           - Await pathology results. Ladene Artist, MD 05/12/2022 8:57:51 AM This report has been signed electronically.

## 2022-05-12 NOTE — Progress Notes (Signed)
Called to room to assist during endoscopic procedure.  Patient ID and intended procedure confirmed with present staff. Received instructions for my participation in the procedure from the performing physician.  

## 2022-05-12 NOTE — Progress Notes (Signed)
History & Physical  Primary Care Physician:  Luetta Nutting, DO Primary Gastroenterologist: Lucio Edward, MD  CHIEF COMPLAINT:  Personal history of colon polyps   HPI: [name] is a 72 y.o. male with a personal history of adenomatous colon polyps for surveillance colonoscopy.   Past Medical History:  Diagnosis Date   Allergy    Anxiety    Arthritis    Asthma    At risk for sleep apnea    STOP-BANG = 4  SENT TO PCP 05-24-2013   Eczema    Edema of foot    Finger wound, simple, open    left middle index finger - stitches and dressing occured on 09/22/2016- followed by Dr Daws at [facility]    H/O hiatal hernia    History of kidney stones    HOH (hard of hearing)    Hyperlipidemia    Influenza A    Influenza B    symptoms started 05-16-2013/  dx 05-17-2013 by pcp   Left ankle injury    twisted left ankle    Left ureteral calculus    Pneumonia    hx of    Rash    right leg- calf -    Wears dentures    bottom    Past Surgical History:  Procedure Laterality Date   ANTERIOR CERVICAL DECOMP/DISCECTOMY FUSION  01/27/2007   C6  --- C7   COLONOSCOPY  08/25/2016   TA   CYSTOSCOPY WITH URETEROSCOPY Left 05/26/2013   Procedure: CYSTOSCOPY WITH URETEROSCOPY, URETHERAL DILATATION,LEFT RETROGRADE, LASER LITHOTRIPSY, STENT PLACMENT, LEFT URETEROSCOPY;  Surgeon: Ailene Rud, MD;  Location: [place];  Service: Urology;  Laterality: Left;   FINGER FRACTURE SURGERY  AS CHILD   GANGLION CYST EXCISION Left 2023   HOLMIUM LASER APPLICATION Left 44/31/5400   Procedure: HOLMIUM LASER APPLICATION;  Surgeon: Ailene Rud, MD;  Location: [facility];  Service: Urology;  Laterality: Left;   LIPOMA EXCISION  05/17/2012   Procedure: EXCISION LIPOMA;  Surgeon: Gayland Curry, MD,FACS;  Location: [place];  Service: General;  Laterality: Left;  incisional biopsy of left shoulder soft tissue mass   NASAL FRACTURE SURGERY   AS CHILD   ORIF ULNAR FRACTURE Right AS CHILD   ROTATOR CUFF REPAIR Right    right shoulder June2018 by [place] Right 12/05/2018   Procedure: LEFT ARTHROSCOPY, CHONDROPLASTY;  Surgeon: Gaynelle Arabian, MD;  Location[facility];  Service: Orthopedics;  Laterality: Right;  32mn   TOTAL KNEE REVISION Right 12/11/2020   Procedure: Right knee polyethylene revision;  Surgeon: AGaynelle Arabian MD;  Location[facility];  Service: Orthopedics;  Laterality: Right;    Prior to Admission medications   Medication Sig Start Date End Date Taking? Authorizing Provider  diclofenac Sodium (VOLTAREN) 1 % GEL APPLY 4 G TOPICALLY 4 (FOUR) TIMES DAILY AS NEEDED.   Yes [provider]  albuterol (VENTOLIN HFA) 108 (90 Base) MCG/ACT inhaler TAKE 2 PUFFS BY MOUTH EVERY 6 HOURS AS NEEDED FOR WHEEZE OR SHORTNESS OF BREATH 06/18/21   PVivi Barrack MD  Cyanocobalamin (VITAMIN B-12) 1000 MCG/15ML LIQD Take 1,000 mcg by mouth daily.    [provider]    Current Outpatient Medications  Medication Sig Dispense Refill   diclofenac Sodium (VOLTAREN) 1 % GEL APPLY 4 G TOPICALLY 4 (FOUR) TIMES DAILY AS NEEDED.     albuterol (VENTOLIN HFA) 108 (90 Base) MCG/ACT  inhaler TAKE 2 PUFFS BY MOUTH EVERY 6 HOURS AS NEEDED FOR WHEEZE OR SHORTNESS OF BREATH 8.5 each 1   Cyanocobalamin (VITAMIN B-12) 1000 MCG/15ML LIQD Take 1,000 mcg by mouth daily.     Current Facility-Administered Medications  Medication Dose Route Frequency Provider Last Rate Last Admin   0.9 %  sodium chloride infusion  500 mL Intravenous Once Ladene Artist, MD        Allergies as of 05/12/2022 - Review Complete 05/12/2022  Allergen Reaction Noted   Sesame oil Itching and Swelling 05/13/2012   Tobramycin Itching and Swelling 05/13/2012    Family History  Problem Relation Age of Onset   Arthritis Other    Hypertension Other    Osteoporosis Other    Heart disease Other    Lung  disease Other    Prostate cancer Father    Heart disease Sister    Colon cancer Neg Hx    Colon polyps Neg Hx    Esophageal cancer Neg Hx    Kidney disease Neg Hx    Rectal cancer Neg Hx    Stomach cancer Neg Hx     Social History   Socioeconomic History   Marital status: Married    Spouse name: Not on file   Number of children: 2   Years of education: Not on file   Highest education level: Not on file  Occupational History   Occupation: Hospital doctor  Tobacco Use   Smoking status: Never    Passive exposure: Never   Smokeless tobacco: Never  Vaping Use   Vaping Use: Never used  Substance and Sexual Activity   Alcohol use: Yes    Alcohol/week: 21.0 standard drinks of alcohol    Types: 21 Cans of beer per week    Comment: beer- 2 beers day sometimes    Drug use: Never   Sexual activity: Not Currently    Partners: Female  Other Topics Concern   Not on file  Social History Narrative   New granddaughter born 2020   Social Determinants of Health   Financial Resource Strain: Not on file  Food Insecurity: Not on file  Transportation Needs: Not on file  Physical Activity: Not on file  Stress: Not on file  Social Connections: Not on file  Intimate Partner Violence: Not on file    Review of Systems:  All systems reviewed were negative except where noted in HPI.   Physical Exam: General:  Alert, well-developed, in NAD Head:  Normocephalic and atraumatic. Eyes:  Sclera clear, no icterus.   Conjunctiva pink. Ears:  Normal auditory acuity. Mouth:  No deformity or lesions.  Neck:  Supple; no masses . Lungs:  Clear throughout to auscultation.   No wheezes, crackles, or rhonchi. No acute distress. Heart:  Regular rate and rhythm; no murmurs. Abdomen:  Soft, nondistended, nontender. No masses, hepatomegaly. No obvious masses.  Normal bowel .    Rectal:  Deferred   Msk:  Symmetrical without gross deformities.. Pulses:  Normal pulses noted. Extremities:   Without edema. Neurologic:  Alert and  oriented x4;  grossly normal neurologically. Skin:  Intact without significant lesions or rashes. Psych:  Alert and cooperative. Normal mood and affect.   Impression / Plan:   Personal history of adenomatous colon polyps for surveillance colonoscopy.  Pricilla Riffle. Fuller Plan  05/12/2022, 8:25 AM See Shea Evans, [facility], to contact our on call provider

## 2022-05-12 NOTE — Progress Notes (Signed)
Sedate, gd SR, tolerated procedure well, VSS, report to RN 

## 2022-05-13 ENCOUNTER — Telehealth: Payer: Self-pay

## 2022-05-13 NOTE — Telephone Encounter (Signed)
  Follow up Call-     05/12/2022    8:20 AM  Call back number  Post procedure Call Back phone  # [phone]  Permission to leave phone message Yes     Patient questions:  Do you have a fever, pain , or abdominal swelling? No. Pain Score  0 *  Have you tolerated food without any problems? Yes.    Have you been able to return to your normal activities? Yes.    Do you have any questions about your discharge instructions: Diet   No. Medications  No. Follow up visit  No.  Do you have questions or concerns about your Care? No.  Actions: * If pain score is 4 or above: No action needed, pain <4.

## 2022-05-21 ENCOUNTER — Encounter: Payer: Self-pay | Admitting: Gastroenterology

## 2022-06-08 ENCOUNTER — Ambulatory Visit (INDEPENDENT_AMBULATORY_CARE_PROVIDER_SITE_OTHER): Payer: PPO

## 2022-06-08 ENCOUNTER — Ambulatory Visit (INDEPENDENT_AMBULATORY_CARE_PROVIDER_SITE_OTHER): Payer: PPO | Admitting: Sports Medicine

## 2022-06-08 DIAGNOSIS — M47816 Spondylosis without myelopathy or radiculopathy, lumbar region: Secondary | ICD-10-CM | POA: Diagnosis not present

## 2022-06-08 DIAGNOSIS — M545 Low back pain, unspecified: Secondary | ICD-10-CM

## 2022-06-08 MED ORDER — MELOXICAM 15 MG PO TABS: ORAL_TABLET | ORAL | 3 refills | Status: DC

## 2022-06-08 MED ORDER — PREDNISONE 50 MG PO TABS: ORAL_TABLET | ORAL | 0 refills | Status: DC

## 2022-06-08 NOTE — Progress Notes (Signed)
    Procedures performed today:    I did review a CT scan from 2015 that showed mild to moderate lumbar DDD and moderate to severe bilateral lower lumbar facet arthritis.  Independent interpretation of notes and tests performed by another provider:   None.  Brief History, Exam, Impression, and Recommendations:    Lumbar spondylosis This is a very pleasant 72 year old male, he has had several months of right-sided axial low back pain without radicular symptoms, he localizes the pain just to the right of the midline of the lumbar spine. On exam he does have tenderness with palpable muscle spasm right paralumbar. Not so much over the SI joint. I did review a CT scan from 2015 that showed mild to moderate lumbar DDD and moderate to severe bilateral lower lumbar facet arthritis. Started conservative, 5 days of prednisone followed by meloxicam, home physical therapy, baseline x-rays. Return to see me in 4 to 6 weeks, MRI if no better.    ____________________________________________ Gwen Her. Dianah Field, M.D., ABFM., CAQSM., AME. Primary Care and Sports Medicine [facility]  Adjunct Professor of [place] [place] [employer]  Risk manager

## 2022-06-08 NOTE — Assessment & Plan Note (Signed)
This is a very pleasant 72 year old male, he has had several months of right-sided axial low back pain without radicular symptoms, he localizes the pain just to the right of the midline of the lumbar spine. On exam he does have tenderness with palpable muscle spasm right paralumbar. Not so much over the SI joint. I did review a CT scan from 2015 that showed mild to moderate lumbar DDD and moderate to severe bilateral lower lumbar facet arthritis. Started conservative, 5 days of prednisone followed by meloxicam, home physical therapy, baseline x-rays. Return to see me in 4 to 6 weeks, MRI if no better.

## 2022-07-03 ENCOUNTER — Ambulatory Visit (INDEPENDENT_AMBULATORY_CARE_PROVIDER_SITE_OTHER): Payer: PPO | Admitting: Sports Medicine

## 2022-07-03 DIAGNOSIS — M47816 Spondylosis without myelopathy or radiculopathy, lumbar region: Secondary | ICD-10-CM

## 2022-07-03 MED ORDER — GABAPENTIN 300 MG PO CAPS: ORAL_CAPSULE | ORAL | 3 refills | Status: DC

## 2022-07-03 NOTE — Progress Notes (Signed)
    Procedures performed today:    None.  Independent interpretation of notes and tests performed by another provider:   None.  Brief History, Exam, Impression, and Recommendations:    Lumbar spondylosis This is a pleasant 72 year old male, he has had several months of right-sided axial low back pain, pain is localized over the quadratus lumborum, no pain over the SI joint, we treated him conservatively with steroids, meloxicam, 6 weeks of home physical therapy, we did get baseline x-rays. He has not improved so we will add gabapentin and proceed with MRI for interventional planning.    ____________________________________________ Gwen Her. Dianah Field, M.D., ABFM., CAQSM., AME. Primary Care and Sports Medicine  [facility]  Adjunct Professor of [place] [employer] [facility] of Medicine  Risk manager

## 2022-07-03 NOTE — Assessment & Plan Note (Addendum)
This is a pleasant 72 year old male, he has had several months of right-sided axial low back pain, pain is localized over the quadratus lumborum, no pain over the SI joint, we treated him conservatively with steroids, meloxicam, 6 weeks of home physical therapy, we did get baseline x-rays. He has not improved so we will add gabapentin and proceed with MRI for interventional planning.

## 2022-07-12 ENCOUNTER — Ambulatory Visit (INDEPENDENT_AMBULATORY_CARE_PROVIDER_SITE_OTHER): Payer: PPO

## 2022-07-12 DIAGNOSIS — M48061 Spinal stenosis, lumbar region without neurogenic claudication: Secondary | ICD-10-CM | POA: Diagnosis not present

## 2022-07-12 DIAGNOSIS — M47816 Spondylosis without myelopathy or radiculopathy, lumbar region: Secondary | ICD-10-CM

## 2022-07-20 ENCOUNTER — Ambulatory Visit (INDEPENDENT_AMBULATORY_CARE_PROVIDER_SITE_OTHER): Payer: PPO | Admitting: Sports Medicine

## 2022-07-20 ENCOUNTER — Ambulatory Visit (INDEPENDENT_AMBULATORY_CARE_PROVIDER_SITE_OTHER): Payer: PPO

## 2022-07-20 DIAGNOSIS — M47816 Spondylosis without myelopathy or radiculopathy, lumbar region: Secondary | ICD-10-CM | POA: Diagnosis not present

## 2022-07-20 DIAGNOSIS — M25812 Other specified joint disorders, left shoulder: Secondary | ICD-10-CM

## 2022-07-20 DIAGNOSIS — M25512 Pain in left shoulder: Secondary | ICD-10-CM | POA: Diagnosis not present

## 2022-07-20 DIAGNOSIS — T84012S Broken internal right knee prosthesis, sequela: Secondary | ICD-10-CM

## 2022-07-20 DIAGNOSIS — G8929 Other chronic pain: Secondary | ICD-10-CM

## 2022-07-20 MED ORDER — CELECOXIB 200 MG PO CAPS: ORAL_CAPSULE | ORAL | 2 refills | Status: DC

## 2022-07-20 NOTE — Assessment & Plan Note (Signed)
Failed revision arthroplasty, patient would not want a repeat arthroplasty so we will minimize imaging. I did bring up genicular ablation, he will think about this and let me know.

## 2022-07-20 NOTE — Progress Notes (Signed)
    Procedures performed today:    None.  Independent interpretation of notes and tests performed by another provider:   None.  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 I did bring up genicular ablation, he will think about this and let me know.  Impingement of left shoulder Chronic left shoulder pain over the deltoid with positive impingement signs and weakness in the rotator cuff. History of desmoid tumor excision. Adding x-rays, home physical therapy, Celebrex instead of meloxicam. Return to see me in 6 weeks, subacromial injection if not better.  Lumbar spondylosis Multilevel facet degenerative changes, our first target will be his facets if he fails the switch to Celebrex. Was unable to tolerate gabapentin.    ____________________________________________ Gwen Her. Dianah Field, M.D., ABFM., CAQSM., AME. Primary Care and Sports Medicine [facility]  Adjunct Professor of [place] [employer] [facility] of Medicine  Risk manager

## 2022-07-20 NOTE — Assessment & Plan Note (Signed)
Chronic left shoulder pain over the deltoid with positive impingement signs and weakness in the rotator cuff. History of desmoid tumor excision. Adding x-rays, home physical therapy, Celebrex instead of meloxicam. Return to see me in 6 weeks, subacromial injection if not better.

## 2022-07-20 NOTE — Assessment & Plan Note (Signed)
Multilevel facet degenerative changes, our first target will be his facets if he fails the switch to Celebrex. Was unable to tolerate gabapentin.

## 2022-07-21 ENCOUNTER — Encounter: Payer: Self-pay | Admitting: Sports Medicine

## 2022-08-05 ENCOUNTER — Encounter: Payer: PPO | Admitting: Family Medicine

## 2022-08-19 ENCOUNTER — Encounter: Payer: PPO | Admitting: Family Medicine

## 2022-08-31 ENCOUNTER — Ambulatory Visit (INDEPENDENT_AMBULATORY_CARE_PROVIDER_SITE_OTHER): Payer: PPO | Admitting: Sports Medicine

## 2022-08-31 ENCOUNTER — Other Ambulatory Visit (INDEPENDENT_AMBULATORY_CARE_PROVIDER_SITE_OTHER): Payer: PPO

## 2022-08-31 DIAGNOSIS — M25812 Other specified joint disorders, left shoulder: Secondary | ICD-10-CM | POA: Diagnosis not present

## 2022-08-31 DIAGNOSIS — M47816 Spondylosis without myelopathy or radiculopathy, lumbar region: Secondary | ICD-10-CM | POA: Diagnosis not present

## 2022-08-31 NOTE — Assessment & Plan Note (Signed)
Multilevel facet degenerative changes, Celebrex ineffective, he did not tolerate gabapentin. He will call and let me know if he would like me to proceed with multilevel lower lumbar facet injections.

## 2022-08-31 NOTE — Progress Notes (Signed)
    Procedures performed today:    Procedure: Real-time Ultrasound Guided injection of the left subacromial bursa Device: Samsung HS60  Verbal informed consent obtained.  Time-out conducted.  Noted no overlying erythema, induration, or other signs of local infection.  Skin prepped in a sterile fashion.  Local anesthesia: Topical Ethyl chloride.  With sterile technique and under real time ultrasound guidance: Intact rotator cuff noted, 1 cc Kenalog 40, 1 cc lidocaine, 1 cc bupivacaine injected easily Completed without difficulty  Advised to call if fevers/chills, erythema, induration, drainage, or persistent bleeding.  Images permanently stored and available for review in PACS.  Impression: Technically successful ultrasound guided injection.  Independent interpretation of notes and tests performed by another provider:   None.  Brief History, Exam, Impression, and Recommendations:    Impingement of left shoulder Pleasant 72 year old male, persistent impingement signs of the left shoulder, today we did a subacromial injection, return to see me in 6 weeks.  Lumbar spondylosis Multilevel facet degenerative changes, Celebrex ineffective, he did not tolerate gabapentin. He will call and let me know if he would like me to proceed with multilevel lower lumbar facet injections.    ____________________________________________ Ihor Austin. Benjamin Stain, M.D., ABFM., CAQSM., AME. Primary Care and Sports Medicine [facility]  Adjunct Professor of Family Medicine  [place] [employer] [facility] of Medicine  Restaurant manager, fast food

## 2022-08-31 NOTE — Assessment & Plan Note (Signed)
Pleasant 72 year old male, persistent impingement signs of the left shoulder, today we did a subacromial injection, return to see me in 6 weeks.

## 2022-09-09 ENCOUNTER — Ambulatory Visit (INDEPENDENT_AMBULATORY_CARE_PROVIDER_SITE_OTHER): Payer: PPO | Admitting: Family Medicine

## 2022-09-09 ENCOUNTER — Encounter: Payer: Self-pay | Admitting: Family Medicine

## 2022-09-09 ENCOUNTER — Telehealth: Payer: Self-pay | Admitting: Family Medicine

## 2022-09-09 VITALS — BP 124/62 | HR 90 | Ht 71.0 in | Wt 232.0 lb

## 2022-09-09 DIAGNOSIS — E7800 Pure hypercholesterolemia, unspecified: Secondary | ICD-10-CM

## 2022-09-09 DIAGNOSIS — M47816 Spondylosis without myelopathy or radiculopathy, lumbar region: Secondary | ICD-10-CM

## 2022-09-09 DIAGNOSIS — Z1159 Encounter for screening for other viral diseases: Secondary | ICD-10-CM

## 2022-09-09 DIAGNOSIS — Z0000 Encounter for general adult medical examination without abnormal findings: Secondary | ICD-10-CM

## 2022-09-09 DIAGNOSIS — Z125 Encounter for screening for malignant neoplasm of prostate: Secondary | ICD-10-CM

## 2022-09-09 NOTE — Assessment & Plan Note (Signed)
Well adult Orders Placed This Encounter  Procedures   COMPLETE METABOLIC PANEL WITH GFR   CBC with Differential   Lipid Panel w/reflex Direct LDL   TSH   PSA   Hepatitis C Antibody  Screenings: per lab orders Immunizations:  Declines Anticipatory guidance/Risk factor reduction:  recommendations per AVS.

## 2022-09-09 NOTE — Progress Notes (Signed)
[name] - 72 y.o. male MRN [id number]  Date of birth: [birth year]  Subjective Chief Complaint  Patient presents with   Annual Exam    HPI [name] is a 72 y.o. male here today for annual exam.   He reports that he is doing well at this time.  He denies new concerns today.   He is moderately active.  He feels that diet is pretty good.   He is a non-smoker.  Up to 2 beers each day.   Declines shingrix.    Review of Systems  Constitutional:  Negative for chills, fever, malaise/fatigue and weight loss.  HENT:  Negative for congestion, ear pain and sore throat.   Eyes:  Negative for blurred vision, double vision and pain.  Respiratory:  Negative for cough and shortness of breath.   Cardiovascular:  Negative for chest pain and palpitations.  Gastrointestinal:  Negative for abdominal pain, blood in stool, constipation, heartburn and nausea.  Genitourinary:  Negative for dysuria and urgency.  Musculoskeletal:  Negative for joint pain and myalgias.  Neurological:  Negative for dizziness and headaches.  Endo/Heme/Allergies:  Does not bruise/bleed easily.  Psychiatric/Behavioral:  Negative for depression. The patient is not nervous/anxious and does not have insomnia.     Allergies  Allergen Reactions   Sesame Oil Itching and Swelling   Tobramycin Itching and Swelling    Used in an eye oint-reaction    Past Medical History:  Diagnosis Date   Allergy    Anxiety    Arthritis    Asthma    At risk for sleep apnea    STOP-BANG = 4  SENT TO PCP 05-24-2013   Eczema    Edema of foot    Finger wound, simple, open    left middle index finger - stitches and dressing occured on 09/22/2016- followed by Dr Daws at [facility]    H/O hiatal hernia    History of kidney stones    HOH (hard of hearing)    Hyperlipidemia    Influenza A    Influenza B    symptoms started 05-16-2013/  dx 05-17-2013 by pcp   Left ankle injury    twisted left ankle    Left ureteral calculus     Pneumonia    hx of    Rash    right leg- calf -    Wears dentures    bottom    Past Surgical History:  Procedure Laterality Date   ANTERIOR CERVICAL DECOMP/DISCECTOMY FUSION  01/27/2007   C6  --- C7   COLONOSCOPY  08/25/2016   TA   CYSTOSCOPY WITH URETEROSCOPY Left 05/26/2013   Procedure: CYSTOSCOPY WITH URETEROSCOPY, URETHERAL DILATATION,LEFT RETROGRADE, LASER LITHOTRIPSY, STENT PLACMENT, LEFT URETEROSCOPY;  Surgeon: Kathi Ludwig, MD;  Location: [facility] [place];  Service: Urology;  Laterality: Left;   FINGER FRACTURE SURGERY  AS CHILD   GANGLION CYST EXCISION Left 2023   HOLMIUM LASER APPLICATION Left 05/26/2013   Procedure: HOLMIUM LASER APPLICATION;  Surgeon: Kathi Ludwig, MD;  Location: [facility];  Service: Urology;  Laterality: Left;   LIPOMA EXCISION  05/17/2012   Procedure: EXCISION LIPOMA;  Surgeon: Atilano Ina, MD,FACS;  Location: [facility];  Service: General;  Laterality: Left;  incisional biopsy of left shoulder soft tissue mass   NASAL FRACTURE SURGERY  AS CHILD   ORIF ULNAR FRACTURE Right AS CHILD   ROTATOR CUFF REPAIR Right    right shoulder [id number] by  [facility] A Gioffre   TOTAL KNEE ARTHROPLASTY Right 12/05/2018   Procedure: LEFT ARTHROSCOPY, CHONDROPLASTY;  Surgeon: Ollen Gross, MD;  Location: [facility];  Service: Orthopedics;  Laterality: Right;    TOTAL KNEE REVISION Right 12/11/2020   Procedure: Right knee polyethylene revision;  Surgeon: Ollen Gross, MD;  Location: [facility];  Service: Orthopedics;  Laterality: Right;    Social History   Socioeconomic History   Marital status: Married    Spouse name: Not on file   Number of children: 2   Years of education: Not on file   Highest education level: Not on file  Occupational History   Occupation: Nurse, learning disability  Tobacco Use   Smoking status: Never    Passive exposure: Never   Smokeless tobacco: Never  Vaping  Use   Vaping Use: Never used  Substance and Sexual Activity   Alcohol use: Yes    Alcohol/week: 21.0 standard drinks of alcohol    Types: 21 Cans of beer per week    Comment: beer- 2 beers day sometimes    Drug use: Never   Sexual activity: Not Currently    Partners: Female  Other Topics Concern   Not on file  Social History Narrative   New granddaughter born 2020   Social Determinants of Health   Financial Resource Strain: Not on file  Food Insecurity: Not on file  Transportation Needs: Not on file  Physical Activity: Not on file  Stress: Not on file  Social Connections: Not on file    Family History  Problem Relation Age of Onset   Arthritis Other    Hypertension Other    Osteoporosis Other    Heart disease Other    Lung disease Other    Prostate cancer Father    Heart disease Sister    Colon cancer Neg Hx    Colon polyps Neg Hx    Esophageal cancer Neg Hx    Kidney disease Neg Hx    Rectal cancer Neg Hx    Stomach cancer Neg Hx     Health Maintenance  Topic Date Due   Hepatitis C Screening  Never done   Medicare Annual Wellness (AWV)  12/10/2022 (Originally 05/15/2020)   Zoster Vaccines- Shingrix (1 of 2) 12/10/2022 (Originally 04/24/1970)   COVID-19 Vaccine (5 - 2023-24 season) 12/26/2022 (Originally 12/26/2021)   INFLUENZA VACCINE  11/26/2022   DTaP/Tdap/Td (5 - Td or Tdap) 09/23/2026   Pneumonia Vaccine 28+ Years old  Completed   HPV VACCINES  Aged Out   COLONOSCOPY (Pts 45-42yrs Insurance coverage will need to be confirmed)  Discontinued     ----------------------------------------------------------------------------------------------------------------------------------------------------------------------------------------------------------------- Physical Exam BP 124/62 (BP Location: Left Arm, Patient Position: Sitting, Cuff Size: Normal)   Pulse 90   Ht 5\' 11"  (1.803 m)   Wt 232 lb (105.2 kg)   SpO2 96%   BMI 32.36 kg/m   Physical  Exam Constitutional:      General: He is not in acute distress. HENT:     Head: Normocephalic and atraumatic.     Right Ear: Tympanic membrane and external ear normal.     Left Ear: Tympanic membrane and external ear normal.  Eyes:     General: No scleral icterus. Neck:     Thyroid: No thyromegaly.  Cardiovascular:     Rate and Rhythm: Normal rate and regular rhythm.     Heart sounds: Normal heart sounds.  Pulmonary:     Effort: Pulmonary effort is normal.  Breath sounds: Normal breath sounds.  Abdominal:     General: Bowel sounds are normal. There is no distension.     Palpations: Abdomen is soft.     Tenderness: There is no abdominal tenderness. There is no guarding.  Musculoskeletal:     Cervical back: Normal range of motion.  Lymphadenopathy:     Cervical: No cervical adenopathy.  Skin:    General: Skin is warm and dry.     Findings: No rash.  Neurological:     Mental Status: He is alert and oriented to person, place, and time.     Cranial Nerves: No cranial nerve deficit.     Motor: No abnormal muscle tone.  Psychiatric:        Mood and Affect: Mood normal.        Behavior: Behavior normal.     ------------------------------------------------------------------------------------------------------------------------------------------------------------------------------------------------------------------- Assessment and Plan  Well adult exam Well adult Orders Placed This Encounter  Procedures   COMPLETE METABOLIC PANEL WITH GFR   CBC with Differential   Lipid Panel w/reflex Direct LDL   TSH   PSA   Hepatitis C Antibody  Screenings: per lab orders Immunizations:  Declines Anticipatory guidance/Risk factor reduction:  recommendations per AVS.    No orders of the defined types were placed in this encounter.   No follow-ups on file.    This visit occurred during the SARS-CoV-2 public health emergency.  Safety protocols were in place, including screening  questions prior to the visit, additional usage of staff PPE, and extensive cleaning of exam room while observing appropriate contact time as indicated for disinfecting solutions.

## 2022-09-09 NOTE — Telephone Encounter (Signed)
Pt was in this afternoon for an appt w/PCP. He wanted to let you know that he has decided to have the spinal injection. He was asking if you could reach out.

## 2022-09-09 NOTE — Patient Instructions (Signed)
Glucosamine and chondroitin supplement-Osteo BiFlex or similar may help with joints.    Preventive Care 66 Years and Older, Male Preventive care refers to lifestyle choices and visits with your health care provider that can promote health and wellness. Preventive care visits are also called wellness exams. What can I expect for my preventive care visit? Counseling During your preventive care visit, your health care provider may ask about your: Medical history, including: Past medical problems. Family medical history. History of falls. Current health, including: Emotional well-being. Home life and relationship well-being. Sexual activity. Memory and ability to understand (cognition). Lifestyle, including: Alcohol, nicotine or tobacco, and drug use. Access to firearms. Diet, exercise, and sleep habits. Work and work Astronomer. Sunscreen use. Safety issues such as seatbelt and bike helmet use. Physical exam Your health care provider will check your: Height and weight. These may be used to calculate your BMI (body mass index). BMI is a measurement that tells if you are at a healthy weight. Waist circumference. This measures the distance around your waistline. This measurement also tells if you are at a healthy weight and may help predict your risk of certain diseases, such as type 2 diabetes and high blood pressure. Heart rate and blood pressure. Body temperature. Skin for abnormal spots. What immunizations do I need?  Vaccines are usually given at various ages, according to a schedule. Your health care provider will recommend vaccines for you based on your age, medical history, and lifestyle or other factors, such as travel or where you work. What tests do I need? Screening Your health care provider may recommend screening tests for certain conditions. This may include: Lipid and cholesterol levels. Diabetes screening. This is done by checking your blood sugar (glucose) after you  have not eaten for a while (fasting). Hepatitis C test. Hepatitis B test. HIV (human immunodeficiency virus) test. STI (sexually transmitted infection) testing, if you are at risk. Lung cancer screening. Colorectal cancer screening. Prostate cancer screening. Abdominal aortic aneurysm (AAA) screening. You may need this if you are a current or former smoker. Talk with your health care provider about your test results, treatment options, and if necessary, the need for more tests. Follow these instructions at home: Eating and drinking  Eat a diet that includes fresh fruits and vegetables, whole grains, lean protein, and low-fat dairy products. Limit your intake of foods with high amounts of sugar, saturated fats, and salt. Take vitamin and mineral supplements as recommended by your health care provider. Do not drink alcohol if your health care provider tells you not to drink. If you drink alcohol: Limit how much you have to 0-2 drinks a day. Know how much alcohol is in your drink. In the [place] one drink equals one 12 oz bottle of beer (355 mL), one 5 oz glass of wine (148 mL), or one 1 oz glass of hard liquor (44 mL). Lifestyle Brush your teeth every morning and night with fluoride toothpaste. Floss one time each day. Exercise for at least 30 minutes 5 or more days each week. Do not use any products that contain nicotine or tobacco. These products include cigarettes, chewing tobacco, and vaping devices, such as e-cigarettes. If you need help quitting, ask your health care provider. Do not use drugs. If you are sexually active, practice safe sex. Use a condom or other form of protection to prevent STIs. Take aspirin only as told by your health care provider. Make sure that you understand how much to take and what form to  take. Work with your health care provider to find out whether it is safe and beneficial for you to take aspirin daily. Ask your health care provider if you need to take a  cholesterol-lowering medicine (statin). Find healthy ways to manage stress, such as: Meditation, yoga, or listening to music. Journaling. Talking to a trusted person. Spending time with friends and family. Safety Always wear your seat belt while driving or riding in a vehicle. Do not drive: If you have been drinking alcohol. Do not ride with someone who has been drinking. When you are tired or distracted. While texting. If you have been using any mind-altering substances or drugs. Wear a helmet and other protective equipment during sports activities. If you have firearms in your house, make sure you follow all gun safety procedures. Minimize exposure to UV radiation to reduce your risk of skin cancer. What's next? Visit your health care provider once a year for an annual wellness visit. Ask your health care provider how often you should have your eyes and teeth checked. Stay up to date on all vaccines. This information is not intended to replace advice given to you by your health care provider. Make sure you discuss any questions you have with your health care provider. Document Revised: 10/09/2020 Document Reviewed: 10/09/2020 Elsevier Patient Education  2023 [employer].

## 2022-09-10 NOTE — Telephone Encounter (Signed)
Proceeding with bilateral L3-S1 facet joint injections.

## 2022-09-17 DIAGNOSIS — H2513 Age-related nuclear cataract, bilateral: Secondary | ICD-10-CM | POA: Diagnosis not present

## 2022-09-17 DIAGNOSIS — Z125 Encounter for screening for malignant neoplasm of prostate: Secondary | ICD-10-CM | POA: Diagnosis not present

## 2022-09-17 DIAGNOSIS — H2511 Age-related nuclear cataract, right eye: Secondary | ICD-10-CM | POA: Diagnosis not present

## 2022-09-17 DIAGNOSIS — H35371 Puckering of macula, right eye: Secondary | ICD-10-CM | POA: Diagnosis not present

## 2022-09-17 DIAGNOSIS — Z1159 Encounter for screening for other viral diseases: Secondary | ICD-10-CM | POA: Diagnosis not present

## 2022-09-17 DIAGNOSIS — H18413 Arcus senilis, bilateral: Secondary | ICD-10-CM | POA: Diagnosis not present

## 2022-09-17 DIAGNOSIS — H25013 Cortical age-related cataract, bilateral: Secondary | ICD-10-CM | POA: Diagnosis not present

## 2022-09-17 DIAGNOSIS — H25043 Posterior subcapsular polar age-related cataract, bilateral: Secondary | ICD-10-CM | POA: Diagnosis not present

## 2022-09-17 DIAGNOSIS — E7800 Pure hypercholesterolemia, unspecified: Secondary | ICD-10-CM | POA: Diagnosis not present

## 2022-09-17 DIAGNOSIS — Z0000 Encounter for general adult medical examination without abnormal findings: Secondary | ICD-10-CM | POA: Diagnosis not present

## 2022-09-18 ENCOUNTER — Telehealth: Payer: Self-pay | Admitting: Family Medicine

## 2022-09-18 DIAGNOSIS — M47816 Spondylosis without myelopathy or radiculopathy, lumbar region: Secondary | ICD-10-CM

## 2022-09-18 NOTE — Telephone Encounter (Signed)
Orders placed for right L3-S1 facet joint injections.  I want him to pay very close attention to how he feels during the shots, a few hours later and then a month later when he sees me again, this will help determine if the facets injected were the pain generators.

## 2022-09-18 NOTE — Telephone Encounter (Signed)
Patient called stating he is now ready to have the procedure for a back injection done.   He stated he was told to call the office when he was ready to have it done and that it was something that could not be done in office.  Patient asked to be called back at [phone].

## 2022-09-23 LAB — CBC WITH DIFFERENTIAL/PLATELET
Absolute Monocytes: 569 cells/uL (ref 200–950)
Basophils Absolute: 70 cells/uL (ref 0–200)
Basophils Relative: 0.9 %
Eosinophils Absolute: 211 cells/uL (ref 15–500)
Eosinophils Relative: 2.7 %
HCT: 45.7 % (ref 38.5–50.0)
Hemoglobin: 15.7 g/dL (ref 13.2–17.1)
Lymphs Abs: 1552 cells/uL (ref 850–3900)
MCH: 32.9 pg (ref 27.0–33.0)
MCHC: 34.4 g/dL (ref 32.0–36.0)
MCV: 95.8 fL (ref 80.0–100.0)
MPV: 11.8 fL (ref 7.5–12.5)
Monocytes Relative: 7.3 %
Neutro Abs: 5398 cells/uL (ref 1500–7800)
Neutrophils Relative %: 69.2 %
Platelets: 193 10*3/uL (ref 140–400)
RBC: 4.77 10*6/uL (ref 4.20–5.80)
RDW: 11.8 % (ref 11.0–15.0)
Total Lymphocyte: 19.9 %
WBC: 7.8 10*3/uL (ref 3.8–10.8)

## 2022-09-23 LAB — COMPLETE METABOLIC PANEL WITH GFR
AG Ratio: 2 (calc) (ref 1.0–2.5)
ALT: 16 U/L (ref 9–46)
AST: 14 U/L (ref 10–35)
Albumin: 4.3 g/dL (ref 3.6–5.1)
Alkaline phosphatase (APISO): 51 U/L (ref 35–144)
BUN: 18 mg/dL (ref 7–25)
CO2: 25 mmol/L (ref 20–32)
Calcium: 8.8 mg/dL (ref 8.6–10.3)
Chloride: 103 mmol/L (ref 98–110)
Creat: 1.05 mg/dL (ref 0.70–1.28)
Globulin: 2.1 g/dL (calc) (ref 1.9–3.7)
Glucose, Bld: 126 mg/dL — ABNORMAL HIGH (ref 65–99)
Potassium: 4.3 mmol/L (ref 3.5–5.3)
Sodium: 137 mmol/L (ref 135–146)
Total Bilirubin: 1 mg/dL (ref 0.2–1.2)
Total Protein: 6.4 g/dL (ref 6.1–8.1)
eGFR: 75 mL/min/{1.73_m2} (ref >=60)

## 2022-09-23 LAB — LIPID PANEL W/REFLEX DIRECT LDL
Cholesterol: 188 mg/dL (ref <200)
HDL: 47 mg/dL (ref >=40)
LDL Cholesterol (Calc): 118 mg/dL (calc) — ABNORMAL HIGH
Non-HDL Cholesterol (Calc): 141 mg/dL (calc) — ABNORMAL HIGH (ref <130)
Total CHOL/HDL Ratio: 4 (calc) (ref <5.0)
Triglycerides: 119 mg/dL (ref <150)

## 2022-09-23 LAB — HCV RNA,QUANTITATIVE REAL TIME PCR
HCV Quantitative Log: <1.18 Log IU/mL
HCV RNA, PCR, QN: <15 IU/mL

## 2022-09-23 LAB — TSH: TSH: 2.84 mIU/L (ref 0.40–4.50)

## 2022-09-23 LAB — PSA: PSA: 0.25 ng/mL (ref <=4.00)

## 2022-09-23 LAB — HEPATITIS C ANTIBODY: Hepatitis C Ab: REACTIVE — AB

## 2022-10-01 ENCOUNTER — Other Ambulatory Visit: Payer: Self-pay | Admitting: Sports Medicine

## 2022-10-01 DIAGNOSIS — M47816 Spondylosis without myelopathy or radiculopathy, lumbar region: Secondary | ICD-10-CM

## 2022-10-02 ENCOUNTER — Other Ambulatory Visit: Payer: Self-pay | Admitting: Sports Medicine

## 2022-10-02 ENCOUNTER — Ambulatory Visit
Admission: RE | Admit: 2022-10-02 | Discharge: 2022-10-02 | Disposition: A | Discharge status: Home / Self Care | Payer: PPO | Source: Ambulatory Visit | Attending: Sports Medicine | Admitting: Sports Medicine

## 2022-10-02 DIAGNOSIS — M47816 Spondylosis without myelopathy or radiculopathy, lumbar region: Secondary | ICD-10-CM

## 2022-10-02 DIAGNOSIS — M47897 Other spondylosis, lumbosacral region: Secondary | ICD-10-CM | POA: Diagnosis not present

## 2022-10-02 DIAGNOSIS — M47896 Other spondylosis, lumbar region: Secondary | ICD-10-CM | POA: Diagnosis not present

## 2022-10-02 MED ORDER — METHYLPREDNISOLONE ACETATE 40 MG/ML INJ SUSP (RADIOLOG
120.0000 mg | Freq: Once | INTRAMUSCULAR | Status: AC
  Administered 2022-10-02: 120 mg via INTRA_ARTICULAR

## 2022-10-02 NOTE — Discharge Instructions (Signed)

## 2022-10-14 ENCOUNTER — Telehealth: Payer: Self-pay | Admitting: Family Medicine

## 2022-10-14 NOTE — Telephone Encounter (Signed)
Looks like he is prescribed celebrex.  He can't take these both at the same time.   CM

## 2022-10-14 NOTE — Telephone Encounter (Signed)
Pt called. He is requesting a refill on his Meloxicam 16mg  tab. Pharmacy:  [facility]

## 2022-10-16 ENCOUNTER — Other Ambulatory Visit: Payer: Self-pay | Admitting: Family Medicine

## 2022-10-16 DIAGNOSIS — M47816 Spondylosis without myelopathy or radiculopathy, lumbar region: Secondary | ICD-10-CM

## 2022-10-16 MED ORDER — MELOXICAM 15 MG PO TABS: ORAL_TABLET | ORAL | 3 refills | Status: DC

## 2022-10-16 NOTE — Telephone Encounter (Signed)
Meloxicam sent in.

## 2022-11-05 ENCOUNTER — Ambulatory Visit (INDEPENDENT_AMBULATORY_CARE_PROVIDER_SITE_OTHER): Payer: PPO | Admitting: Sports Medicine

## 2022-11-05 ENCOUNTER — Ambulatory Visit (INDEPENDENT_AMBULATORY_CARE_PROVIDER_SITE_OTHER): Payer: PPO

## 2022-11-05 DIAGNOSIS — T84012S Broken internal right knee prosthesis, sequela: Secondary | ICD-10-CM | POA: Diagnosis not present

## 2022-11-05 DIAGNOSIS — M25561 Pain in right knee: Secondary | ICD-10-CM | POA: Diagnosis not present

## 2022-11-05 DIAGNOSIS — Z0189 Encounter for other specified special examinations: Secondary | ICD-10-CM

## 2022-11-05 DIAGNOSIS — M47816 Spondylosis without myelopathy or radiculopathy, lumbar region: Secondary | ICD-10-CM | POA: Diagnosis not present

## 2022-11-05 DIAGNOSIS — Z96651 Presence of right artificial knee joint: Secondary | ICD-10-CM | POA: Diagnosis not present

## 2022-11-05 DIAGNOSIS — M25812 Other specified joint disorders, left shoulder: Secondary | ICD-10-CM

## 2022-11-05 DIAGNOSIS — M25562 Pain in left knee: Secondary | ICD-10-CM | POA: Diagnosis not present

## 2022-11-05 NOTE — Assessment & Plan Note (Signed)
Known multilevel facet degenerative changes, Celebrex was ineffective, he did not tolerate gabapentin, we proceeded with multilevel lumbar lower lumbar facet joint injections, right L3-S1. He reported complete relief immediately after the injections as well as continued long-term relief suggesting that the right L3-S1 facets we targeted where the primary pain generators, if this recurs we will consider facet RFA but otherwise return as needed for this, continue home conditioning.

## 2022-11-05 NOTE — Assessment & Plan Note (Signed)
[name] did have complete relief of his shoulder pain post left shoulder subacromial injection at the last visit, continue home conditioning and return as needed for this.

## 2022-11-05 NOTE — Progress Notes (Signed)
    Procedures performed today:    None.  Independent interpretation of notes and tests performed by another provider:   None.  Brief History, Exam, Impression, and Recommendations:    Failed total right knee replacement (HCC) [name] returns, he is a pleasant 72 year old male, he is status post failed revision arthroplasty for a loose patellar component per his report.  Surgery performed by Dr. Ollen Gross. He is complaining of pain lateral knee predominantly over the iliotibial band worse when bending his knee and gardening. No constitutional symptoms. On exam he does have pain directly over the IT band, he has a positive Ober's test on exam today. I did explain to him the importance of avoiding instrumentation of the knee joint status post arthroplasty due to risk of infection. We will treat him conservatively for iliotibial band syndrome with stretching, I would like x-rays of his knee to ensure no loosening of the prosthesis, if insufficient improvement after 2 to 4 weeks we will consider an iliotibial band injection, this injection is extra-articular. If this fails we will further address his lumbar spine.  Impingement of left shoulder [name] did have complete relief of his shoulder pain post left shoulder subacromial injection at the last visit, continue home conditioning and return as needed for this.  Lumbar spondylosis Known multilevel facet degenerative changes, Celebrex was ineffective, he did not tolerate gabapentin, we proceeded with multilevel lumbar lower lumbar facet joint injections, right L3-S1. He reported complete relief immediately after the injections as well as continued long-term relief suggesting that the right L3-S1 facets we targeted where the primary pain generators, if this recurs we will consider facet RFA but otherwise return as needed for this, continue home conditioning.    ____________________________________________ Ihor Austin. Benjamin Stain, M.D.,  ABFM., CAQSM., AME. Primary Care and Sports Medicine [facility]  Adjunct Professor of Family Medicine  [place] [employer] [facility] of Medicine  Restaurant manager, fast food

## 2022-11-05 NOTE — Assessment & Plan Note (Addendum)
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  Surgery performed by Dr. Ollen Gross. He is complaining of pain lateral knee predominantly over the iliotibial band worse when bending his knee and gardening. No constitutional symptoms. On exam he does have pain directly over the IT band, he has a positive Ober's test on exam today. I did explain to him the importance of avoiding instrumentation of the knee joint status post arthroplasty due to risk of infection. We will treat him conservatively for iliotibial band syndrome with stretching, I would like x-rays of his knee to ensure no loosening of the prosthesis, if insufficient improvement after 2 to 4 weeks we will consider an iliotibial band injection, this injection is extra-articular. If this fails we will further address his lumbar spine.

## 2022-11-18 DIAGNOSIS — L814 Other melanin hyperpigmentation: Secondary | ICD-10-CM | POA: Diagnosis not present

## 2022-11-18 DIAGNOSIS — B354 Tinea corporis: Secondary | ICD-10-CM | POA: Diagnosis not present

## 2022-11-18 DIAGNOSIS — L298 Other pruritus: Secondary | ICD-10-CM | POA: Diagnosis not present

## 2022-11-18 DIAGNOSIS — Z09 Encounter for follow-up examination after completed treatment for conditions other than malignant neoplasm: Secondary | ICD-10-CM | POA: Diagnosis not present

## 2022-11-18 DIAGNOSIS — L308 Other specified dermatitis: Secondary | ICD-10-CM | POA: Diagnosis not present

## 2022-11-18 DIAGNOSIS — D225 Melanocytic nevi of trunk: Secondary | ICD-10-CM | POA: Diagnosis not present

## 2022-11-18 DIAGNOSIS — L821 Other seborrheic keratosis: Secondary | ICD-10-CM | POA: Diagnosis not present

## 2022-11-18 DIAGNOSIS — Z872 Personal history of diseases of the skin and subcutaneous tissue: Secondary | ICD-10-CM | POA: Diagnosis not present

## 2022-11-18 DIAGNOSIS — L570 Actinic keratosis: Secondary | ICD-10-CM | POA: Diagnosis not present

## 2022-11-26 ENCOUNTER — Ambulatory Visit (INDEPENDENT_AMBULATORY_CARE_PROVIDER_SITE_OTHER): Payer: PPO | Admitting: Sports Medicine

## 2022-11-26 DIAGNOSIS — T84012S Broken internal right knee prosthesis, sequela: Secondary | ICD-10-CM | POA: Diagnosis not present

## 2022-11-26 DIAGNOSIS — M792 Neuralgia and neuritis, unspecified: Secondary | ICD-10-CM | POA: Diagnosis not present

## 2022-11-26 MED ORDER — TRIAMCINOLONE ACETONIDE 40 MG/ML IJ SUSP
40.0000 mg | Freq: Once | INTRAMUSCULAR | Status: AC
Start: 2022-11-26 — End: 2022-11-26
  Administered 2022-11-26: 40 mg via INTRAMUSCULAR

## 2022-11-26 NOTE — Addendum Note (Signed)
Addended by: Carren Rang A on: 11/26/2022 11:32 AM   Modules accepted: Orders

## 2022-11-26 NOTE — Progress Notes (Signed)
    Procedures performed today:    Procedure:  Injection of right IT band Consent obtained and verified. Time-out conducted. Noted no overlying erythema, induration, or other signs of local infection. Skin prepped in a sterile fashion. Topical analgesic spray: Ethyl chloride. Completed without difficulty. Meds: 25-gauge needle advanced just deep to the IT band from an anterior approach, I then injected 1 cc kenalog 40, 1 cc lidocaine. Advised to call if fevers/chills, erythema, induration, drainage, or persistent bleeding.  Independent interpretation of notes and tests performed by another provider:   None.  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 Ollen Gross, more recently he has had increasing lateral knee pain predominantly over the IT band worse with bending the knee, guarding, his pain was directly over the distal IT band lateral to the femoral condyle. He has a positive Ober's test on exam. We added IT band exercises and stretches, x-rays did not show any signs of arthroplasty complication. Unfortunately he did not improve, today I did an injection just deep to the IT band. He unfortunately did not note any immediate improvement. He will continue home conditioning, and return to see me in about 6 weeks, considering persistence of discomfort in spite of the injection and the potential of a radicular etiology I would like a nerve conduction and EMG right lower extremity.    ____________________________________________ [name]. [name], M.D., ABFM., CAQSM., AME. Primary Care and Sports Medicine [facility]  Adjunct Professor of Family Medicine  [place]  Restaurant manager, fast food

## 2022-11-26 NOTE — Assessment & Plan Note (Signed)
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 Ollen Gross, more recently he has had increasing lateral knee pain predominantly over the IT band worse with bending the knee, guarding, his pain was directly over the distal IT band lateral to the femoral condyle. He has a positive Ober's test on exam. We added IT band exercises and stretches, x-rays did not show any signs of arthroplasty complication. Unfortunately he did not improve, today I did an injection just deep to the IT band. He unfortunately did not note any immediate improvement. He will continue home conditioning, and return to see me in about 6 weeks, considering persistence of discomfort in spite of the injection and the potential of a radicular etiology I would like a nerve conduction and EMG right lower extremity.

## 2022-11-30 ENCOUNTER — Other Ambulatory Visit: Payer: Self-pay

## 2022-11-30 ENCOUNTER — Encounter: Payer: Self-pay | Admitting: Neurology

## 2022-11-30 DIAGNOSIS — R202 Paresthesia of skin: Secondary | ICD-10-CM

## 2022-12-01 ENCOUNTER — Ambulatory Visit: Payer: PPO | Admitting: Family Medicine

## 2022-12-01 VITALS — BP 127/85 | HR 64 | Ht 71.0 in | Wt 226.1 lb

## 2022-12-01 DIAGNOSIS — Z0000 Encounter for general adult medical examination without abnormal findings: Secondary | ICD-10-CM | POA: Diagnosis not present

## 2022-12-01 NOTE — Patient Instructions (Addendum)
MEDICARE ANNUAL WELLNESS VISIT Health Maintenance Summary and Written Plan of Care  [name] ,  Thank you for allowing me to perform your Medicare Annual Wellness Visit and for your ongoing commitment to your health.   Health Maintenance & Immunization History Health Maintenance  Topic Date Due   Zoster Vaccines- Shingrix (1 of 2) 12/10/2022 (Originally 04/24/1970)   COVID-19 Vaccine (5 - 2023-24 season) 12/26/2022 (Originally 12/26/2021)   INFLUENZA VACCINE  01/25/2023 (Originally 11/26/2022)   Medicare Annual Wellness (AWV)  12/01/2023   DTaP/Tdap/Td (5 - Td or Tdap) 09/23/2026   Pneumonia Vaccine 48+ Years old  Completed   Hepatitis C Screening  Completed   HPV VACCINES  Aged Out   Colonoscopy  Discontinued   Immunization History  Administered Date(s) Administered   Fluad Quad(high Dose 65+) 01/23/2020, 02/25/2022   Influenza Split 02/16/2013   Influenza Whole 01/23/2009, 01/16/2010   Influenza, High Dose Seasonal PF 02/25/2015, 03/10/2016, 02/18/2017, 02/08/2018, 03/07/2019, 02/20/2021   Influenza,inj,Quad PF,6+ Mos 02/06/2014   Moderna Sars-Covid-2 Vaccination 05/26/2019, 06/26/2019   PFIZER Comirnaty(Gray Top)Covid-19 Tri-Sucrose Vaccine 11/16/2020   PFIZER(Purple Top)SARS-COV-2 Vaccination 03/18/2020, 11/16/2020   Pneumococcal Conjugate-13 02/06/2014   Pneumococcal Polysaccharide-23 03/27/1998, 04/14/2007, 12/08/2007, 03/10/2016   Pneumococcal-Unspecified 02/25/2021   Td 04/27/2004   Td (Adult), 2 Lf Tetanus Toxid, Preservative Free 04/27/2004   Tdap 02/06/2014, 09/22/2016    These are the patient goals that we discussed:  Goals Addressed               This Visit's Progress     Patient Stated (pt-stated)        Patient stated that he would like to loose 20 lbs.         This is a list of Health Maintenance Items that are overdue or due now: Influenza vaccine Shingles vaccine    Orders/Referrals Placed Today: No orders of the defined types were placed  in this encounter.  (Contact our referral department at [phone] if you have not spoken with someone about your referral appointment within the next 5 days)    Follow-up Plan Follow-up with Everrett Coombe, DO as planned Schedule shingles vaccine at the pharmacy. Medicare wellness visit in one year.  AVS printed and given to the patient.      Health Maintenance, Male Adopting a healthy lifestyle and getting preventive care are important in promoting health and wellness. Ask your health care provider about: The right schedule for you to have regular tests and exams. Things you can do on your own to prevent diseases and keep yourself healthy. What should I know about diet, weight, and exercise? Eat a healthy diet  Eat a diet that includes plenty of vegetables, fruits, low-fat dairy products, and lean protein. Do not eat a lot of foods that are high in solid fats, added sugars, or sodium. Maintain a healthy weight Body mass index (BMI) is a measurement that can be used to identify possible weight problems. It estimates body fat based on height and weight. Your health care provider can help determine your BMI and help you achieve or maintain a healthy weight. Get regular exercise Get regular exercise. This is one of the most important things you can do for your health. Most adults should: Exercise for at least 150 minutes each week. The exercise should increase your heart rate and make you sweat (moderate-intensity exercise). Do strengthening exercises at least twice a week. This is in addition to the moderate-intensity exercise. Spend less time sitting. Even light physical activity can  be beneficial. Watch cholesterol and blood lipids Have your blood tested for lipids and cholesterol at 74 years of age, then have this test every 5 years. You may need to have your cholesterol levels checked more often if: Your lipid or cholesterol levels are high. You are older than 74 years of  age. You are at high risk for heart disease. What should I know about cancer screening? Many types of cancers can be detected early and may often be prevented. Depending on your health history and family history, you may need to have cancer screening at various ages. This may include screening for: Colorectal cancer. Prostate cancer. Skin cancer. Lung cancer. What should I know about heart disease, diabetes, and high blood pressure? Blood pressure and heart disease High blood pressure causes heart disease and increases the risk of stroke. This is more likely to develop in people who have high blood pressure readings or are overweight. Talk with your health care provider about your target blood pressure readings. Have your blood pressure checked: Every 3-5 years if you are 57-32 years of age. Every year if you are 35 years old or older. If you are between the ages of 57 and 83 and are a current or former smoker, ask your health care provider if you should have a one-time screening for abdominal aortic aneurysm (AAA). Diabetes Have regular diabetes screenings. This checks your fasting blood sugar level. Have the screening done: Once every three years after age 68 if you are at a normal weight and have a low risk for diabetes. More often and at a younger age if you are overweight or have a high risk for diabetes. What should I know about preventing infection? Hepatitis B If you have a higher risk for hepatitis B, you should be screened for this virus. Talk with your health care provider to find out if you are at risk for hepatitis B infection. Hepatitis C Blood testing is recommended for: Everyone born from [birth year] through 1967. Anyone with known risk factors for hepatitis C. Sexually transmitted infections (STIs) You should be screened each year for STIs, including gonorrhea and chlamydia, if: You are sexually active and are younger than 74 years of age. You are older than 74 years of age  and your health care provider tells you that you are at risk for this type of infection. Your sexual activity has changed since you were last screened, and you are at increased risk for chlamydia or gonorrhea. Ask your health care provider if you are at risk. Ask your health care provider about whether you are at high risk for HIV. Your health care provider may recommend a prescription medicine to help prevent HIV infection. If you choose to take medicine to prevent HIV, you should first get tested for HIV. You should then be tested every 3 months for as long as you are taking the medicine. Follow these instructions at home: Alcohol use Do not drink alcohol if your health care provider tells you not to drink. If you drink alcohol: Limit how much you have to 0-2 drinks a day. Know how much alcohol is in your drink. In the [place] one drink equals one 12 oz bottle of beer (355 mL), one 5 oz glass of wine (148 mL), or one 1 oz glass of hard liquor (44 mL). Lifestyle Do not use any products that contain nicotine or tobacco. These products include cigarettes, chewing tobacco, and vaping devices, such as e-cigarettes. If you need help quitting, ask  your health care provider. Do not use street drugs. Do not share needles. Ask your health care provider for help if you need support or information about quitting drugs. General instructions Schedule regular health, dental, and eye exams. Stay current with your vaccines. Tell your health care provider if: You often feel depressed. You have ever been abused or do not feel safe at home. Summary Adopting a healthy lifestyle and getting preventive care are important in promoting health and wellness. Follow your health care provider's instructions about healthy diet, exercising, and getting tested or screened for diseases. Follow your health care provider's instructions on monitoring your cholesterol and blood pressure. This information is not intended to  replace advice given to you by your health care provider. Make sure you discuss any questions you have with your health care provider. Document Revised: 09/02/2020 Document Reviewed: 09/02/2020 Elsevier Patient Education  2024 [employer].

## 2022-12-01 NOTE — Progress Notes (Signed)
MEDICARE ANNUAL WELLNESS VISIT  12/01/2022  Subjective:  [name] is a 72 y.o. male patient of Everrett Coombe, DO who had a Medicare Annual Wellness Visit today. Harvest is Retired and lives with their spouse. he has 2 children. he reports that he is socially active and does interact with friends/family regularly. he is moderately physically active and enjoys wood working, Engineer, water.  Patient Care Team: Everrett Coombe, DO as PCP - General (Family Medicine) Ollen Gross, MD as Consulting Physician (Orthopedic Surgery) Carman Ching, OD as Consulting Physician (Optometry)     12/01/2022    9:19 AM 04/09/2022    3:12 PM 12/11/2020    6:36 PM 11/29/2020    8:14 AM 05/16/2019    2:30 PM 12/05/2018    3:52 PM 11/30/2018    8:40 AM  Advanced Directives  Does Patient Have a Medical Advance Directive? Yes No No No Yes No No  Type of Advance Directive Living will    Healthcare Power of [place];Living will    Does patient want to make changes to medical advance directive? No - Patient declined    No - Patient declined    Copy of Healthcare Power of Attorney in Chart?     No - copy requested    Would patient like information on creating a medical advance directive?  No - Patient declined No - Patient declined   No - Patient declined No - Patient declined    Hospital Utilization Over the Past 12 Months: # of hospitalizations or ER visits: 0 # of surgeries: 0  Review of Systems    Patient reports that his overall health is better when compared to last year.  Review of Systems: History obtained from chart review and the patient  All other systems negative.  Pain Assessment Pain : 0-10 Pain Score: 6  Pain Type: Chronic pain Pain Location: Knee Pain Orientation: Right Pain Descriptors / Indicators: Aching Pain Onset: More than a month ago Pain Frequency: Intermittent     Current Medications & Allergies (verified) Allergies as of 12/01/2022       Reactions   Sesame Oil  Itching, Swelling   Tobramycin Itching, Swelling   Used in an eye oint-reaction        Medication List        Accurate as of December 01, 2022  9:44 AM. If you have any questions, ask your nurse or doctor.          albuterol 108 (90 Base) MCG/ACT inhaler Commonly known as: VENTOLIN HFA TAKE 2 PUFFS BY MOUTH EVERY 6 HOURS AS NEEDED FOR WHEEZE OR SHORTNESS OF BREATH   diclofenac Sodium 1 % Gel Commonly known as: VOLTAREN APPLY 4 G TOPICALLY 4 (FOUR) TIMES DAILY AS NEEDED.   fluocinonide ointment 0.05 % Commonly known as: LIDEX APPLY TO HANDS AT BEDTIME   gatifloxacin 0.5 % Soln Commonly known as: ZYMAXID INSTILL 1 DROP INTO RIGHT EYE 4 TIMES A DAY AS DIRECTED   ketoconazole 2 % cream Commonly known as: NIZORAL APPLY TO THE LOWER LEGS ONCE DAILY   ketorolac 0.5 % ophthalmic solution Commonly known as: ACULAR INSTILL 1 DROP INTO RIGHT EYE 4 TIMES A DAY AS DIRECTED   meloxicam 15 MG tablet Commonly known as: MOBIC One tab PO every 24 hours with a meal for 2 weeks, then once every 24 hours prn pain.   prednisoLONE acetate 1 % ophthalmic suspension Commonly known as: PRED FORTE INSTILL 1 DROP INTO RIGHT EYE 4  TIMES A DAY AS DIRECTED   terbinafine 250 MG tablet Commonly known as: LAMISIL Take 250 mg by mouth daily.   Vitamin B-12 1000 MCG/15ML Liqd Take 1,000 mcg by mouth daily.        History (reviewed): Past Medical History:  Diagnosis Date   Allergy    Anxiety    Arthritis    Asthma    At risk for sleep apnea    STOP-BANG = 4  SENT TO PCP 05-24-2013   Cataract 2023   Eczema    Edema of foot    Finger wound, simple, open    left middle index finger - stitches and dressing occured on 09/22/2016- followed by Dr Daws at [facility]    H/O hiatal hernia    History of kidney stones    HOH (hard of hearing)    Hyperlipidemia    Influenza A    Influenza B    symptoms started 05-16-2013/  dx 05-17-2013 by pcp   Left ankle injury    twisted left ankle     Left ureteral calculus    Pneumonia    hx of    Rash    right leg- calf -    Wears dentures    bottom   Past Surgical History:  Procedure Laterality Date   ANTERIOR CERVICAL DECOMP/DISCECTOMY FUSION  01/27/2007   C6  --- C7   COLONOSCOPY  08/25/2016   TA   CYSTOSCOPY WITH URETEROSCOPY Left 05/26/2013   Procedure: CYSTOSCOPY WITH URETEROSCOPY, URETHERAL DILATATION,LEFT RETROGRADE, LASER LITHOTRIPSY, STENT PLACMENT, LEFT URETEROSCOPY;  Surgeon: Kathi Ludwig, MD;  Location[facility];  Service: Urology;  Laterality: Left;   FINGER FRACTURE SURGERY  AS CHILD   GANGLION CYST EXCISION Left 2023   HOLMIUM LASER APPLICATION Left 05/26/2013   Procedure: HOLMIUM LASER APPLICATION;  Surgeon: Kathi Ludwig, MD;  Location: [facility];  Service: Urology;  Laterality: Left;   JOINT REPLACEMENT  2922   LIPOMA EXCISION  05/17/2012   Procedure: EXCISION LIPOMA;  Surgeon: Atilano Ina, MD,FACS;  Location: [facility];  Service: General;  Laterality: Left;  incisional biopsy of left shoulder soft tissue mass   NASAL FRACTURE SURGERY  AS CHILD   ORIF ULNAR FRACTURE Right AS CHILD   ROTATOR CUFF REPAIR Right    right shoulder June2018 by [facility] Windy Fast A Gioffre   TOTAL KNEE ARTHROPLASTY Right 12/05/2018   Procedure: LEFT ARTHROSCOPY, CHONDROPLASTY;  Surgeon: Ollen Gross, MD;  Location: [facility];  Service: Orthopedics;  Laterality: Right;    TOTAL KNEE REVISION Right 12/11/2020   Procedure: Right knee polyethylene revision;  Surgeon: Ollen Gross, MD;  Location: [facility];  Service: Orthopedics;  Laterality: Right;   Family History  Problem Relation Age of Onset   Arthritis Other    Hypertension Other    Osteoporosis Other    Heart disease Other    Lung disease Other    Prostate cancer Father    Hearing loss Father    Heart disease Sister    Asthma Mother    Colon cancer Neg Hx    Colon polyps Neg Hx     Esophageal cancer Neg Hx    Kidney disease Neg Hx    Rectal cancer Neg Hx    Stomach cancer Neg Hx    Social History   Socioeconomic History   Marital status: Married    Spouse name: Jaeceon Smethurst   Number of children: 2  Years of education: 73   Highest education level: Some college, no degree  Occupational History   Occupation: Nurse, learning disability   Occupation: Retired  Tobacco Use   Smoking status: Never    Passive exposure: Never   Smokeless tobacco: Never  Vaping Use   Vaping status: Never Used  Substance and Sexual Activity   Alcohol use: Yes    Alcohol/week: 21.0 standard drinks of alcohol    Types: 21 Cans of beer per week    Comment: beer- 0-4 beers day sometimes   Drug use: Never   Sexual activity: Not Currently    Partners: Female  Other Topics Concern   Not on file  Social History Narrative   Lives with his wife. He has two children. He enjoys wood working, Engineer, water.   Social Determinants of Health   Financial Resource Strain: Low Risk  (12/01/2022)   Overall Financial Resource Strain (CARDIA)    Difficulty of Paying Living Expenses: Not hard at all  Food Insecurity: No Food Insecurity (12/01/2022)   Hunger Vital Sign    Worried About Running Out of Food in the Last Year: Never true    Ran Out of Food in the Last Year: Never true  Transportation Needs: No Transportation Needs (12/01/2022)   PRAPARE - Administrator, Civil Service (Medical): No    Lack of Transportation (Non-Medical): No  Physical Activity: Insufficiently Active (12/01/2022)   Exercise Vital Sign    Days of Exercise per Week: 4 days    Minutes of Exercise per Session: 20 min  Stress: No Stress Concern Present (12/01/2022)   [employer] of Occupational Health - Occupational Stress Questionnaire    Feeling of Stress : Not at all  Social Connections: Moderately Integrated (12/01/2022)   Social Connection and Isolation Panel [NHANES]    Frequency of Communication  with Friends and Family: More than three times a week    Frequency of Social Gatherings with Friends and Family: Twice a week    Attends Religious Services: More than 4 times per year    Active Member of [employer] or Organizations: No    Attends Banker Meetings: Never    Marital Status: Married    Activities of Daily Living    12/01/2022    9:21 AM 11/15/2022    9:42 PM  In your present state of health, do you have any difficulty performing the following activities:  Hearing? 1 1  Comment bilateral hearing aids   Vision? 1 1  Comment due for cataract surgery   Difficulty concentrating or making decisions? 1 0  Comment some short term memory loss   Walking or climbing stairs? 0 1  Dressing or bathing? 0 0  Doing errands, shopping? 0 0  Preparing Food and eating ? N N  Using the Toilet? N   In the past six months, have you accidently leaked urine? N N  Do you have problems with loss of bowel control? N N  Managing your Medications? N N  Managing your Finances? N N  Housekeeping or managing your Housekeeping? N N    Patient Education/Literacy How often do you need to have someone help you when you read instructions, pamphlets, or other written materials from your doctor or pharmacy?: 1 - Never What is the last grade level you completed in school?: some college  Exercise    Diet Patient reports consuming 2 meals a day and 3 snack(s) a day Patient reports that his  primary diet is: Regular Patient reports that she does have regular access to food.   Depression Screen    12/01/2022    9:17 AM 04/09/2022    3:13 PM 11/27/2020   10:51 AM 05/16/2019    2:43 PM 11/18/2018    1:48 PM 09/01/2017    8:37 AM 03/15/2017    1:47 PM  PHQ 2/9 Scores  PHQ - 2 Score 0 0 0 0 0 0 0  PHQ- 9 Score  3   0       Fall Risk    12/01/2022    9:17 AM 11/15/2022    9:42 PM 09/09/2022    3:41 PM 04/09/2022    3:13 PM 11/27/2020   10:52 AM  Fall Risk   Falls in the past year? 0 1 0 0 1   Number falls in past yr: 0 0 0 0 1  Injury with Fall? 0 0 0 0 1  Risk for fall due to : No Fall Risks  No Fall Risks No Fall Risks   Follow up Falls evaluation completed  Falls evaluation completed Falls evaluation completed      Objective:   BP 127/85 (BP Location: Left Arm, Patient Position: Sitting, Cuff Size: Large)   Pulse 64   Ht 5\' 11"  (1.803 m)   Wt 226 lb 1.3 oz (102.5 kg)   SpO2 96%   BMI 31.53 kg/m   Last Weight  Most recent update: 12/01/2022  9:04 AM    Weight  102.5 kg (226 lb 1.3 oz)             Body mass index is 31.53 kg/m.  Hearing/Vision  Tyrique did not have difficulty with hearing/understanding during the face-to-face interview Jayleon did not have difficulty with his vision during the face-to-face interview Reports that he has had a formal eye exam by an eye care professional within the past year Reports that he has had a formal hearing evaluation within the past year  Cognitive Function:    12/01/2022    9:24 AM 05/16/2019    2:35 PM  6CIT Screen  What Year? 0 points 0 points  What month? 0 points 0 points  What time? 0 points 0 points  Count back from 20 0 points 0 points  Months in reverse 0 points 0 points  Repeat phrase 4 points 0 points  Total Score 4 points 0 points    Normal Cognitive Function Screening: Yes (Normal:0-7, Significant for Dysfunction: >8)  Immunization & Health Maintenance Record Immunization History  Administered Date(s) Administered   Fluad Quad(high Dose 65+) 01/23/2020, 02/25/2022   Influenza Split 02/16/2013   Influenza Whole 01/23/2009, 01/16/2010   Influenza, High Dose Seasonal PF 02/25/2015, 03/10/2016, 02/18/2017, 02/08/2018, 03/07/2019, 02/20/2021   Influenza,inj,Quad PF,6+ Mos 02/06/2014   Moderna Sars-Covid-2 Vaccination 05/26/2019, 06/26/2019   PFIZER Comirnaty(Gray Top)Covid-19 Tri-Sucrose Vaccine 11/16/2020   PFIZER(Purple Top)SARS-COV-2 Vaccination 03/18/2020, 11/16/2020   Pneumococcal Conjugate-13  02/06/2014   Pneumococcal Polysaccharide-23 03/27/1998, 04/14/2007, 12/08/2007, 03/10/2016   Pneumococcal-Unspecified 02/25/2021   Td 04/27/2004   Td (Adult), 2 Lf Tetanus Toxid, Preservative Free 04/27/2004   Tdap 02/06/2014, 09/22/2016    Health Maintenance  Topic Date Due   Zoster Vaccines- Shingrix (1 of 2) 12/10/2022 (Originally 04/24/1970)   COVID-19 Vaccine (5 - 2023-24 season) 12/26/2022 (Originally 12/26/2021)   INFLUENZA VACCINE  01/25/2023 (Originally 11/26/2022)   Medicare Annual Wellness (AWV)  12/01/2023   DTaP/Tdap/Td (5 - Td or Tdap) 09/23/2026   Pneumonia Vaccine 84+ Years old  Completed   Hepatitis C Screening  Completed   HPV VACCINES  Aged Out   Colonoscopy  Discontinued       Assessment  This is a routine wellness examination for [employer].  Health Maintenance: Due or Overdue There are no preventive care reminders to display for this patient.   Jolyne Loa Spang does not need a referral for Community Assistance: Care Management:   no Social Work:    no Prescription Assistance:  no Nutrition/Diabetes Education:  no   Plan:  Personalized Goals  Goals Addressed               This Visit's Progress     Patient Stated (pt-stated)        Patient stated that he would like to loose 20 lbs.       Personalized Health Maintenance & Screening Recommendations  Influenza vaccine Shingles vaccine  Lung Cancer Screening Recommended: no (Low Dose CT Chest recommended if Age 77-80 years, 20 pack-year currently smoking OR have quit w/in past 15 years) Hepatitis C Screening recommended: no HIV Screening recommended: no  Advanced Directives: Written information was not given per the patient's request.  Referrals & Orders No orders of the defined types were placed in this encounter.   Follow-up Plan Follow-up with Everrett Coombe, DO as planned Schedule shingles vaccine at the pharmacy. Medicare wellness visit in one year.  AVS printed and given to the  patient.   I have personally reviewed and noted the following in the patient's chart:   Medical and social history Use of alcohol, tobacco or illicit drugs  Current medications and supplements Functional ability and status Nutritional status Physical activity Advanced directives List of other physicians Hospitalizations, surgeries, and ER visits in previous 12 months Vitals Screenings to include cognitive, depression, and falls Referrals and appointments  In addition, I have reviewed and discussed with patient certain preventive protocols, quality metrics, and best practice recommendations. A written personalized care plan for preventive services as well as general preventive health recommendations were provided to patient.     Modesto Charon, RN BSN  12/01/2022

## 2022-12-09 DIAGNOSIS — H2511 Age-related nuclear cataract, right eye: Secondary | ICD-10-CM | POA: Diagnosis not present

## 2022-12-10 DIAGNOSIS — H25042 Posterior subcapsular polar age-related cataract, left eye: Secondary | ICD-10-CM | POA: Diagnosis not present

## 2022-12-10 DIAGNOSIS — H25012 Cortical age-related cataract, left eye: Secondary | ICD-10-CM | POA: Diagnosis not present

## 2022-12-10 DIAGNOSIS — H2512 Age-related nuclear cataract, left eye: Secondary | ICD-10-CM | POA: Diagnosis not present

## 2022-12-17 ENCOUNTER — Ambulatory Visit: Payer: PPO | Admitting: Neurology

## 2022-12-17 DIAGNOSIS — R202 Paresthesia of skin: Secondary | ICD-10-CM

## 2022-12-17 NOTE — Procedures (Signed)
  [facility]  [address] [place], [address]  [place], [place] [address] Tel: [phone] Fax: [phone] Test Date:  12/17/2022  Patient: [name] DOB: 10/23/1950 Physician: Nita Sickle, DO  Sex: Male Height: 5\' 11"  Ref Phys: Rodney Langton, MD  ID#: [id number]   Technician:    History: This is a 72 year old man referred for evaluation of right leg pain.  NCV & EMG Findings: Extensive electrodiagnostic testing of the right lower extremity shows: Right sural and superficial peroneal sensory responses are within normal limits. Right peroneal and tibial motor responses are within normal limits. Right tibial H reflex study is within normal limits. There is no evidence of active or chronic motor axonal loss changes affecting any of the tested muscles.  Motor unit configuration and recruitment pattern is within normal limits.  Impression: This is a normal study of the right lower extremity.  In particular, there is no evidence of a large fiber sensorimotor polyneuropathy or lumbosacral radiculopathy.   ___________________________ Nita Sickle, DO    Nerve Conduction Studies   Stim Site NR Peak (ms) Norm Peak (ms) O-P Amp (V) Norm O-P Amp  Right Sup Peroneal Anti Sensory (Ant Lat Mall)  32 C  12 cm    2.7 <4.6 5.5 >3  Right Sural Anti Sensory (Lat Mall)  32 C  Calf    3.2 <4.6 9.7 >3     Stim Site NR Onset (ms) Norm Onset (ms) O-P Amp (mV) Norm O-P Amp Site1 Site2 Delta-0 (ms) Dist (cm) Vel (m/s) Norm Vel (m/s)  Right Peroneal Motor (Ext Dig Brev)  32 C  Ankle    3.7 <6.0 4.9 >2.5 B Fib Ankle 7.8 35.0 45 >40  B Fib    11.5  4.3  Poplt B Fib 1.6 8.0 50 >40  Poplt    13.1  4.0         Right Tibial Motor (Abd Hall Brev)  32 C  Ankle    4.2 <6.0 4.8 >4 Knee Ankle 9.3 44.0 47 >40  Knee    13.5  3.3          Electromyography   Side Muscle Ins.Act Fibs Fasc Recrt Amp Dur Poly Activation Comment  Right AntTibialis Nml Nml Nml Nml Nml Nml Nml Nml N/A   Right Gastroc Nml Nml Nml Nml Nml Nml Nml Nml N/A  Right Flex Dig Long Nml Nml Nml Nml Nml Nml Nml Nml N/A  Right RectFemoris Nml Nml Nml Nml Nml Nml Nml Nml N/A  Right BicepsFemS Nml Nml Nml Nml Nml Nml Nml Nml N/A  Right GluteusMed Nml Nml Nml Nml Nml Nml Nml Nml N/A      Waveforms:

## 2022-12-23 DIAGNOSIS — H2512 Age-related nuclear cataract, left eye: Secondary | ICD-10-CM | POA: Diagnosis not present

## 2022-12-24 ENCOUNTER — Ambulatory Visit: Payer: PPO | Admitting: Family Medicine

## 2022-12-24 ENCOUNTER — Ambulatory Visit: Payer: PPO | Admitting: Sports Medicine

## 2022-12-30 DIAGNOSIS — R233 Spontaneous ecchymoses: Secondary | ICD-10-CM | POA: Diagnosis not present

## 2022-12-30 DIAGNOSIS — B354 Tinea corporis: Secondary | ICD-10-CM | POA: Diagnosis not present

## 2022-12-31 ENCOUNTER — Ambulatory Visit (INDEPENDENT_AMBULATORY_CARE_PROVIDER_SITE_OTHER): Payer: PPO | Admitting: Sports Medicine

## 2022-12-31 ENCOUNTER — Encounter: Payer: Self-pay | Admitting: Sports Medicine

## 2022-12-31 DIAGNOSIS — M47816 Spondylosis without myelopathy or radiculopathy, lumbar region: Secondary | ICD-10-CM

## 2022-12-31 DIAGNOSIS — T84012S Broken internal right knee prosthesis, sequela: Secondary | ICD-10-CM

## 2022-12-31 NOTE — Assessment & Plan Note (Signed)
History of failed revision arthroplasty right knee, had a loose patellar component. Continued knee pain, he did endorse pain over the IT band worse with bending the knee, he had a positive Ober's test on exam at the last visit. He failed IT band exercises and stretches, x-rays did not show arthroplasty complications, we did an injection just deep to the IT band at the beginning of August, he does report improvement in the lateral knee pain. Still has some posterior buttock and thigh pain that I suspect is related to his lumbar spondylosis. Of note her nerve conduction and EMG were negative.

## 2022-12-31 NOTE — Assessment & Plan Note (Addendum)
Known multilevel facet degenerative changes, Celebrex ineffective, gabapentin intolerable, right L3-S1 facet joint injections were highly effective, they also took care of some of his right buttock and thigh pain. Nerve conduction and EMG were negative. He has had a recurrence of pain now, proceeding with RFA. May return to see me 6 weeks after RFA if performed.

## 2022-12-31 NOTE — Progress Notes (Signed)
    Procedures performed today:    None.  Independent interpretation of notes and tests performed by another provider:   None.  Brief History, Exam, Impression, and Recommendations:    Lumbar spondylosis Known multilevel facet degenerative changes, Celebrex ineffective, gabapentin intolerable, right L3-S1 facet joint injections were highly effective, they also took care of some of his right buttock and thigh pain. Nerve conduction and EMG were negative. He has had a recurrence of pain now, proceeding with RFA. May return to see me 6 weeks after RFA if performed.  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 Continued knee pain, he did endorse pain over the IT band worse with bending the knee, he had a positive Ober's test on exam at the last visit. He failed IT band exercises and stretches, x-rays did not show arthroplasty complications, we did an injection just deep to the IT band at the beginning of August, he does report improvement in the lateral knee pain. Still has some posterior buttock and thigh pain that I suspect is related to his lumbar spondylosis. Of note her nerve conduction and EMG were negative.  I spent 40 minutes of total time managing this patient today, this includes chart review, face to face, and non-face to face time.  ____________________________________________ Ihor Austin. Benjamin Stain, M.D., ABFM., CAQSM., AME. Primary Care and Sports Medicine [facility]  Adjunct Professor of Family Medicine  [employer]  Restaurant manager, fast food

## 2023-01-13 ENCOUNTER — Other Ambulatory Visit: Payer: Self-pay | Admitting: Sports Medicine

## 2023-01-13 ENCOUNTER — Telehealth: Payer: Self-pay | Admitting: Family Medicine

## 2023-01-13 DIAGNOSIS — M47816 Spondylosis without myelopathy or radiculopathy, lumbar region: Secondary | ICD-10-CM

## 2023-01-13 NOTE — Telephone Encounter (Signed)
Call and spoke to them and they will work on it and call patient to get him scheduled.

## 2023-01-13 NOTE — Telephone Encounter (Signed)
I placed the orders on 12/31/2022, please call [facility] imaging.  The referral is under the imaging tab.

## 2023-01-13 NOTE — Telephone Encounter (Signed)
Patient called stating he was supposed to get a referral for Ablation Therapy after his appointment on 12/31/22. The patient has not heard anything since and would like an update. The last referral I could find was from 11/26/22. Please advise.

## 2023-01-28 ENCOUNTER — Telehealth: Payer: Self-pay | Admitting: Sports Medicine

## 2023-01-28 NOTE — Telephone Encounter (Signed)
Patient's wife called to follow up on referral for RFA she has concerns and wanted to know the status

## 2023-01-28 NOTE — Telephone Encounter (Signed)
It was ordered last month, cc'ing to [place].

## 2023-01-29 NOTE — Telephone Encounter (Signed)
Per scheduling notes 01/13/23, patient was going out of town and would contact scheduler when ready.   Contacted patient [name]) directly, gave him Joni Reining from [facility] ph: [phone] to schedule procedure appointment.

## 2023-02-01 ENCOUNTER — Ambulatory Visit (INDEPENDENT_AMBULATORY_CARE_PROVIDER_SITE_OTHER): Payer: PPO | Admitting: Family Medicine

## 2023-02-01 ENCOUNTER — Encounter: Payer: Self-pay | Admitting: Family Medicine

## 2023-02-01 VITALS — BP 120/76 | HR 78 | Ht 71.0 in | Wt 227.0 lb

## 2023-02-01 DIAGNOSIS — G3184 Mild cognitive impairment, so stated: Secondary | ICD-10-CM | POA: Diagnosis not present

## 2023-02-01 DIAGNOSIS — Z23 Encounter for immunization: Secondary | ICD-10-CM | POA: Diagnosis not present

## 2023-02-01 MED ORDER — FLUOCINONIDE 0.05 % EX OINT: TOPICAL_OINTMENT | Freq: Two times a day (BID) | CUTANEOUS | 0 refills | Status: DC

## 2023-02-01 NOTE — Patient Instructions (Signed)
Mild Neurocognitive Disorder Mild neurocognitive disorder, formerly known as mild cognitive impairment, is a disorder in which memory does not work as well as it should. This disorder may also cause problems with other mental functions, including thought, communication, behavior, and completion of tasks. These problems can be noticed and measured, but they usually do not interfere with daily activities or the ability to live independently. Mild neurocognitive disorder typically develops after 74 years of age, but it can also develop at younger ages. It is not as serious as major neurocognitive disorder, also known as dementia, but it may be the first sign of it. Generally, symptoms of this condition get worse over time. In rare cases, symptoms can get better. What are the causes? This condition may be caused by: Brain disorders like Alzheimer's disease, Parkinson's disease, and other conditions that gradually damage nerve cells (neurodegenerative conditions). Diseases that affect blood vessels in the brain and result in small strokes. Certain infections, such as HIV. Traumatic brain injury. Other medical conditions, such as brain tumors, underactive thyroid (hypothyroidism), and vitamin B12 deficiency. Use of certain drugs or prescription medicines. What increases the risk? The following factors may make you more likely to develop this condition: Being older than 65 years. Being male. Low education level. Diabetes, high blood pressure, high cholesterol, and other conditions that increase the risk for blood vessel diseases. Untreated or undertreated sleep apnea. Having a certain type of gene that can be passed from parent to child (inherited). Chronic health problems such as heart disease, lung disease, liver disease, kidney disease, or depression. What are the signs or symptoms? Symptoms of this condition include: Difficulty remembering. You may: Forget names, phone numbers, or details of  recent events. Forget social events and appointments. Repeatedly forget where you put your car keys or other items. Difficulty thinking and solving problems. You may have trouble with complex tasks, such as: Paying bills. Driving in unfamiliar places. Difficulty communicating. You may have trouble: Finding the right word or naming an object. Forming a sentence that makes sense, or understanding what you read or hear. Changes in your behavior or personality. When this happens, you may: Lose interest in the things that you used to enjoy. Withdraw from social situations. Get angry more easily than usual. Act before thinking. How is this diagnosed? This condition is diagnosed based on: Your symptoms. Your health care provider may ask you and the people you spend time with, such as family and friends, about your symptoms. Evaluation of mental functions (neuropsychological testing). Your health care provider may refer you to a neurologist or mental health specialist to evaluate your mental functions in detail. To identify the cause of your condition, your health care provider may: Get a detailed medical history. Ask about use of alcohol, drugs, and prescription medicines. Do a physical exam. Order blood tests and brain imaging exams. How is this treated? Mild neurocognitive disorder that is caused by medicine use, drug use, infection, or another medical condition may improve when the cause is treated, or when medicines or drugs are stopped. If this disorder has another cause, it generally does not improve and may get worse. In these cases, the goal of treatment is to help you manage the loss of mental function. Treatments in these cases include: Medicine. Medicine mainly helps memory and behavior symptoms. Talk therapy. Talk therapy provides education, emotional support, memory aids, and other ways of making up for problems with mental function. Lifestyle changes, including: Getting regular  exercise. Eating a healthy diet  that includes omega-3 fatty acids. Challenging your thinking and memory skills. Having more social interaction. Follow these instructions at home: Eating and drinking  Drink enough fluid to keep your urine pale yellow. Eat a healthy diet that includes omega-3 fatty acids. These can be found in: Fish. Nuts. Leafy vegetables. Vegetable oils. If you drink alcohol: Limit how much you use to: 0-1 drink a day for women. 0-2 drinks a day for men. Be aware of how much alcohol is in your drink. In the [place] one drink equals one 12 oz bottle of beer (355 mL), one 5 oz glass of wine (148 mL), or one 1 oz glass of hard liquor (44 mL). Lifestyle  Get regular exercise as told by your health care provider. Do not use any products that contain nicotine or tobacco, such as cigarettes, e-cigarettes, and chewing tobacco. If you need help quitting, ask your health care provider. Practice ways to manage stress. If you need help managing stress, ask your health care provider. Continue to have social interaction. Keep your mind active with stimulating activities you enjoy, such as reading or playing games. Make sure to get quality sleep. Follow these tips: Avoid napping during the day. Keep your sleeping area dark and cool. Avoid exercising during the few hours before you go to bed. Avoid caffeine products in the evening. General instructions Take over-the-counter and prescription medicines only as told by your health care provider. Your health care provider may recommend that you avoid taking medicines that can affect thinking, such as pain medicines or sleep medicines. Work with your health care provider to find out what you need help with and what your safety needs are. Keep all follow-up visits. This is important. Where to find more information [employer] on Aging: https://walker.com/ Contact a health care provider if: You have any new symptoms. Get help right  away if: You develop new confusion or your confusion gets worse. You act in ways that place you or your family in danger. Summary Mild neurocognitive disorder is a disorder in which memory does not work as well as it should. Mild neurocognitive disorder can have many causes. It may be the first stage of dementia. To manage your condition, get regular exercise, keep your mind active, get quality sleep, and eat a healthy diet. This information is not intended to replace advice given to you by your health care provider. Make sure you discuss any questions you have with your health care provider. Document Revised: 08/28/2019 Document Reviewed: 08/28/2019 Elsevier Patient Education  2024 [employer].

## 2023-02-01 NOTE — Assessment & Plan Note (Signed)
Checking labs identify reversible causes of cognitive change.  We discussed cutting back on alcohol intake.  We discussed evaluation for sleep apnea given his fatigue and heavy snoring however he declines evaluation at this time.  I did go and place referral to neuropsychiatry as it will likely be several months before he is able to be seen.

## 2023-02-01 NOTE — Progress Notes (Signed)
[name] - 72 y.o. male MRN [id number]  Date of birth: [birth year]  Subjective Chief Complaint  Patient presents with   Memory Loss    HPI [name] is a 72 y.o. male here today to discuss memory loss.  He and his wife have noted increase in short term memory loss.  He is more forgetful of where he leaves things and doesn't always remember what he is supposed to do when walking into a room or when his wife tells him to do something.  He does admit to being tire frequently.  Wife reports pretty heavy snoring.  No family history of dementia that he is aware of.  No medications that would contribute to this.  Denies OTC sleep aids. .   ROS:  A comprehensive ROS was completed and negative except as noted per HPI  Allergies  Allergen Reactions   Sesame Oil Itching and Swelling   Tobramycin Itching and Swelling    Used in an eye oint-reaction    Past Medical History:  Diagnosis Date   Allergy    Anxiety    Arthritis    Asthma    At risk for sleep apnea    STOP-BANG = 4  SENT TO PCP 05-24-2013   Cataract 2023   Eczema    Edema of foot    Finger wound, simple, open    left middle index finger - stitches and dressing occured on 09/22/2016- followed by Dr Daws at [facility]    H/O hiatal hernia    History of kidney stones    HOH (hard of hearing)    Hyperlipidemia    Influenza A    Influenza B    symptoms started 05-16-2013/  dx 05-17-2013 by pcp   Left ankle injury    twisted left ankle    Left ureteral calculus    Pneumonia    hx of    Rash    right leg- calf -    Wears dentures    bottom    Past Surgical History:  Procedure Laterality Date   ANTERIOR CERVICAL DECOMP/DISCECTOMY FUSION  01/27/2007   C6  --- C7   COLONOSCOPY  08/25/2016   TA   CYSTOSCOPY WITH URETEROSCOPY Left 05/26/2013   Procedure: CYSTOSCOPY WITH URETEROSCOPY, URETHERAL DILATATION,LEFT RETROGRADE, LASER LITHOTRIPSY, STENT PLACMENT, LEFT URETEROSCOPY;  Surgeon: Kathi Ludwig, MD;   Location: [facility];  Service: Urology;  Laterality: Left;   FINGER FRACTURE SURGERY  AS CHILD   GANGLION CYST EXCISION Left 2023   HOLMIUM LASER APPLICATION Left 05/26/2013   Procedure: HOLMIUM LASER APPLICATION;  Surgeon: Kathi Ludwig, MD;  Location: [facility];  Service: Urology;  Laterality: Left;   JOINT REPLACEMENT  2922   LIPOMA EXCISION  05/17/2012   Procedure: EXCISION LIPOMA;  Surgeon: Atilano Ina, MD,FACS;  Location: [facility];  Service: General;  Laterality: Left;  incisional biopsy of left shoulder soft tissue mass   NASAL FRACTURE SURGERY  AS CHILD   ORIF ULNAR FRACTURE Right AS CHILD   ROTATOR CUFF REPAIR Right    right shoulder June2018 by [facility]   TOTAL KNEE ARTHROPLASTY Right 12/05/2018   Procedure: LEFT ARTHROSCOPY, CHONDROPLASTY;  Surgeon: Ollen Gross, MD;  Location: [facility];  Service: Orthopedics;  Laterality: Right;    TOTAL KNEE REVISION Right 12/11/2020   Procedure: Right knee polyethylene revision;  Surgeon: Ollen Gross, MD;  Location: [facility];  Service: Orthopedics;  Laterality: Right;    Social History   Socioeconomic History   Marital status: Married    Spouse name: Keeyan Tignor   Number of children: 2   Years of education: 13   Highest education level: Some college, no degree  Occupational History   Occupation: Nurse, learning disability   Occupation: Retired  Tobacco Use   Smoking status: Never    Passive exposure: Never   Smokeless tobacco: Never  Vaping Use   Vaping status: Never Used  Substance and Sexual Activity   Alcohol use: Yes    Alcohol/week: 21.0 standard drinks of alcohol    Types: 21 Cans of beer per week    Comment: beer- 0-4 beers day sometimes   Drug use: Never   Sexual activity: Not Currently    Partners: Female  Other Topics Concern   Not on file  Social History Narrative   Lives with his wife. He has two children. He enjoys  wood working, Engineer, water.   Social Determinants of Health   Financial Resource Strain: Low Risk  (12/01/2022)   Overall Financial Resource Strain (CARDIA)    Difficulty of Paying Living Expenses: Not hard at all  Food Insecurity: No Food Insecurity (12/01/2022)   Hunger Vital Sign    Worried About Running Out of Food in the Last Year: Never true    Ran Out of Food in the Last Year: Never true  Transportation Needs: No Transportation Needs (12/01/2022)   PRAPARE - Administrator, Civil Service (Medical): No    Lack of Transportation (Non-Medical): No  Physical Activity: Insufficiently Active (12/01/2022)   Exercise Vital Sign    Days of Exercise per Week: 4 days    Minutes of Exercise per Session: 20 min  Stress: No Stress Concern Present (12/01/2022)   [employer] of Occupational Health - Occupational Stress Questionnaire    Feeling of Stress : Not at all  Social Connections: Moderately Integrated (12/01/2022)   Social Connection and Isolation Panel [NHANES]    Frequency of Communication with Friends and Family: More than three times a week    Frequency of Social Gatherings with Friends and Family: Twice a week    Attends Religious Services: More than 4 times per year    Active Member of [employer] or Organizations: No    Attends Banker Meetings: Never    Marital Status: Married    Family History  Problem Relation Age of Onset   Arthritis Other    Hypertension Other    Osteoporosis Other    Heart disease Other    Lung disease Other    Prostate cancer Father    Hearing loss Father    Heart disease Sister    Asthma Mother    Colon cancer Neg Hx    Colon polyps Neg Hx    Esophageal cancer Neg Hx    Kidney disease Neg Hx    Rectal cancer Neg Hx    Stomach cancer Neg Hx     Health Maintenance  Topic Date Due   COVID-19 Vaccine (5 - 2023-24 season) 02/17/2023 (Originally 12/27/2022)   Zoster Vaccines- Shingrix (1 of 2) 05/04/2023 (Originally  04/24/1970)   Medicare Annual Wellness (AWV)  12/01/2023   DTaP/Tdap/Td (5 - Td or Tdap) 09/23/2026   Pneumonia Vaccine 41+ Years old  Completed   INFLUENZA VACCINE  Completed   Hepatitis C Screening  Completed   HPV VACCINES  Aged Out   Colonoscopy  Discontinued     -----------------------------------------------------------------------------------------------------------------------------------------------------------------------------------------------------------------  Physical Exam BP 120/76 (BP Location: Left Arm, Patient Position: Sitting, Cuff Size: Large)   Pulse 78   Ht 5\' 11"  (1.803 m)   Wt 227 lb (103 kg)   SpO2 97%   BMI 31.66 kg/m   Physical Exam Constitutional:      Appearance: Normal appearance.  HENT:     Head: Normocephalic and atraumatic.  Eyes:     General: No scleral icterus. Cardiovascular:     Rate and Rhythm: Normal rate and regular rhythm.  Pulmonary:     Effort: Pulmonary effort is normal.     Breath sounds: Normal breath sounds.  Musculoskeletal:     Cervical back: Neck supple.  Neurological:     Mental Status: He is alert.  Psychiatric:        Mood and Affect: Mood normal.        Behavior: Behavior normal.     ------------------------------------------------------------------------------------------------------------------------------------------------------------------------------------------------------------------- Assessment and Plan  MCI (mild cognitive impairment) Checking labs identify reversible causes of cognitive change.  We discussed cutting back on alcohol intake.  We discussed evaluation for sleep apnea given his fatigue and heavy snoring however he declines evaluation at this time.  I did go and place referral to neuropsychiatry as it will likely be several months before he is able to be seen.   Meds ordered this encounter  Medications   fluocinonide ointment (LIDEX) 0.05 %    Sig: Apply topically 2 (two) times daily.     Dispense:  30 g    Refill:  0    No follow-ups on file.    This visit occurred during the SARS-CoV-2 public health emergency.  Safety protocols were in place, including screening questions prior to the visit, additional usage of staff PPE, and extensive cleaning of exam room while observing appropriate contact time as indicated for disinfecting solutions.

## 2023-02-03 NOTE — Discharge Instructions (Signed)
Medial Branch Block Discharge Instructions  Take over-the-counter and prescription medicines only as told by your health care provider.  Do not drive the day of your procedure  Return to your normal activities as told by your health care provider.   If injection site is sore you may ice the area for 20 minutes, 2-3 times a day.   Check your injection site every day for signs of infection. Check for: Redness, swelling, or pain. Fluid or blood. Warmth. Pus or a bad smell.  Please contact our office at [phone] if: You have a fever or chills. You have any signs of infection. You develop any numbness or weakness.   Thank you for visiting our office. Medial [facility] Block Discharge Instructions  Take over-the-counter and prescription medicines only as told by your health care provider.  Do not drive the day of your procedure  Return to your normal activities as told by your health care provider.   If injection site is sore you may ice the area for 20 minutes, 2-3 times a day.   Check your injection site every day for signs of infection. Check for: Redness, swelling, or pain. Fluid or blood. Warmth. Pus or a bad smell.  Please contact our office at [phone] if: You have a fever or chills. You have any signs of infection. You develop any numbness or weakness.   Thank you for visiting our office.

## 2023-02-04 ENCOUNTER — Ambulatory Visit
Admission: RE | Admit: 2023-02-04 | Discharge: 2023-02-04 | Disposition: A | Discharge status: Home / Self Care | Payer: PPO | Source: Ambulatory Visit | Attending: Sports Medicine | Admitting: Sports Medicine

## 2023-02-04 ENCOUNTER — Other Ambulatory Visit: Payer: Self-pay | Admitting: Sports Medicine

## 2023-02-04 DIAGNOSIS — M47816 Spondylosis without myelopathy or radiculopathy, lumbar region: Secondary | ICD-10-CM

## 2023-02-04 LAB — RPR: RPR Ser Ql: NONREACTIVE

## 2023-02-04 LAB — TSH: TSH: 3.53 u[IU]/mL (ref 0.450–4.500)

## 2023-02-04 LAB — VITAMIN B12: Vitamin B-12: 689 pg/mL (ref 232–1245)

## 2023-02-04 LAB — VITAMIN B1: Thiamine: 131.3 nmol/L (ref 66.5–200.0)

## 2023-02-04 NOTE — Discharge Instructions (Signed)
Medial Branch Block Discharge Instructions  Take over-the-counter and prescription medicines only as told by your health care provider.  Do not drive the day of your procedure  Return to your normal activities as told by your health care provider.   If injection site is sore you may ice the area for 20 minutes, 2-3 times a day.   Check your injection site every day for signs of infection. Check for: Redness, swelling, or pain. Fluid or blood. Warmth. Pus or a bad smell.  Please contact our office at [phone] if: You have a fever or chills. You have any signs of infection. You develop any numbness or weakness.   Thank you for visiting our office.  

## 2023-02-11 ENCOUNTER — Encounter: Payer: Self-pay | Admitting: Sports Medicine

## 2023-02-11 ENCOUNTER — Ambulatory Visit (INDEPENDENT_AMBULATORY_CARE_PROVIDER_SITE_OTHER): Payer: PPO | Admitting: Sports Medicine

## 2023-02-11 DIAGNOSIS — M25812 Other specified joint disorders, left shoulder: Secondary | ICD-10-CM | POA: Diagnosis not present

## 2023-02-11 DIAGNOSIS — T84012S Broken internal right knee prosthesis, sequela: Secondary | ICD-10-CM

## 2023-02-11 DIAGNOSIS — M47816 Spondylosis without myelopathy or radiculopathy, lumbar region: Secondary | ICD-10-CM

## 2023-02-11 NOTE — Assessment & Plan Note (Signed)
Multilevel lumbar spondylosis, facet degenerative changes, mild spinal stenosis with disc disease L4-L5, Celebrex ineffective, gabapentin intolerable, EMG nerve conduction negative.   Right L3-S1 facet joint injections were highly effective but he only got a few weeks to a month of relief, we then proceeded with MBB in anticipation of RFA, he got no relief, not even temporary from MBB. At this point we will do a couple of things, adding aggressive formal physical therapy as the home therapy component is not helping, we will also set him up for a L4-L5 interlaminar epidural, return to see me 6 weeks.

## 2023-02-11 NOTE — Assessment & Plan Note (Signed)
[name] is having recurrence of pain left shoulder, we last treated this in the summertime, he did really well after a left subacromial injection. 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 Now having recurrence of pain, impingement signs on exam, no additional x-rays needed, we will going to have him do some formal physical therapy as home conditioning is not effective. If ineffective after 6 weeks we will consider repeat subacromial injection.

## 2023-02-11 NOTE — Assessment & Plan Note (Signed)
History of failed revision arthroplasty right knee, he did have a loose patellar component. 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 Negative nerve conduction and EMG. I would like Dr. Lequita Halt to weigh in again. In addition I would like to go and get him a consult for geniculate artery embolization.

## 2023-02-11 NOTE — Progress Notes (Signed)
    Procedures performed today:    None.  Independent interpretation of notes and tests performed by another provider:   MRI personally reviewed, multilevel spondylitic changes, spinal stenosis and disc disease worst L4-L5, also multilevel facet arthritis.  Brief History, Exam, Impression, and Recommendations:    Impingement of left shoulder [name] is having recurrence of pain left shoulder, we last treated this in the summertime, he did really well after a left subacromial injection. 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 Now having recurrence of pain, impingement signs on exam, no additional x-rays needed, we will going to have him do some formal physical therapy as home conditioning is not effective. If ineffective after 6 weeks we will consider repeat subacromial injection.  Lumbar spondylosis Multilevel lumbar spondylosis, facet degenerative changes, mild spinal stenosis with disc disease L4-L5, Celebrex ineffective, gabapentin intolerable, EMG nerve conduction negative.   Right L3-S1 facet joint injections were highly effective but he only got a few weeks to a month of relief, we then proceeded with MBB in anticipation of RFA, he got no relief, not even temporary from MBB. At this point we will do a couple of things, adding aggressive formal physical therapy as the home therapy component is not helping, we will also set him up for a L4-L5 interlaminar epidural, return to see me 6 weeks.  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 Negative nerve conduction and EMG. I would like Dr. Lequita Halt to weigh in again. In addition I would  like to go and get him a consult for geniculate artery embolization.    ____________________________________________ [name]. [name], M.D., ABFM., CAQSM., AME. Primary Care and Sports Medicine [facility]  Adjunct Professor of Family Medicine  [employer]  Restaurant manager, fast food

## 2023-02-15 ENCOUNTER — Ambulatory Visit: Payer: PPO | Attending: Sports Medicine | Admitting: Rehabilitative and Restorative Service Providers"

## 2023-02-15 ENCOUNTER — Encounter: Payer: Self-pay | Admitting: Rehabilitative and Restorative Service Providers"

## 2023-02-15 ENCOUNTER — Other Ambulatory Visit: Payer: Self-pay

## 2023-02-15 DIAGNOSIS — R29898 Other symptoms and signs involving the musculoskeletal system: Secondary | ICD-10-CM | POA: Diagnosis not present

## 2023-02-15 DIAGNOSIS — M25812 Other specified joint disorders, left shoulder: Secondary | ICD-10-CM | POA: Insufficient documentation

## 2023-02-15 DIAGNOSIS — R293 Abnormal posture: Secondary | ICD-10-CM | POA: Insufficient documentation

## 2023-02-15 DIAGNOSIS — M6281 Muscle weakness (generalized): Secondary | ICD-10-CM | POA: Insufficient documentation
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
## 2023-02-20 ENCOUNTER — Other Ambulatory Visit: Payer: Self-pay | Admitting: Family Medicine

## 2023-02-20 DIAGNOSIS — M47816 Spondylosis without myelopathy or radiculopathy, lumbar region: Secondary | ICD-10-CM

## 2023-02-23 ENCOUNTER — Encounter: Payer: Self-pay | Admitting: Rehabilitative and Restorative Service Providers"

## 2023-02-23 ENCOUNTER — Ambulatory Visit: Payer: PPO | Admitting: Rehabilitative and Restorative Service Providers"

## 2023-02-23 DIAGNOSIS — R293 Abnormal posture: Secondary | ICD-10-CM

## 2023-02-23 DIAGNOSIS — M25812 Other specified joint disorders, left shoulder: Secondary | ICD-10-CM | POA: Diagnosis not present

## 2023-02-23 DIAGNOSIS — M6281 Muscle weakness (generalized): Secondary | ICD-10-CM

## 2023-02-23 DIAGNOSIS — R29898 Other symptoms and signs involving the musculoskeletal system: Secondary | ICD-10-CM
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
## 2023-02-26 ENCOUNTER — Ambulatory Visit: Payer: PPO

## 2023-03-02 ENCOUNTER — Encounter: Payer: PPO | Admitting: Rehabilitative and Restorative Service Providers"

## 2023-03-04 ENCOUNTER — Other Ambulatory Visit (HOSPITAL_COMMUNITY): Payer: Self-pay | Admitting: Student

## 2023-03-04 DIAGNOSIS — Z96651 Presence of right artificial knee joint: Secondary | ICD-10-CM | POA: Diagnosis not present

## 2023-03-08 DIAGNOSIS — Z96651 Presence of right artificial knee joint: Secondary | ICD-10-CM | POA: Diagnosis not present

## 2023-03-09 ENCOUNTER — Encounter: Payer: PPO | Admitting: Rehabilitative and Restorative Service Providers"

## 2023-03-10 NOTE — Progress Notes (Addendum)
Chief Complaint: Patient was seen in virtual telephone consultation today for right knee pain.   Referring Physician(s): Rodney Langton J  History of Present Illness: [name] is a 72 y.o. male with a medical history significant for anxiety, kidney stones and right knee osteoarthritis s/p right total knee arthroplasty in 2020. Prior to this he received extensive non-operative therapies including analgesics, steroid and viscosupplement injections and home exercise programs. In 2022 he was taken back to the OR for a right knee revision due to increased pain and instability - his surgeries were with Dr. Despina Hick. He experiences pain over the IT band which is worse when he bends the knee. Dr. Benjamin Stain injected this area which provided some short-lived relief. Interventional Radiology has been asked to evaluate this patient for possible geniculate artery embolization.   The patient presents today via virtual tele-health visit to discuss this treatment option.  Currently wearing knee brace which helps his pain some.  Describes an unstable feeling about the knee.  The pain is around the lateral aspect of the knee and to the knee cap.  This area is tender to palpation.  He has a bone scan planned for this coming Monday.  WOMAC Pain Score = 62/96 VAS Pain Score = 7/10   Past Medical History:  Diagnosis Date   Allergy    Anxiety    Arthritis    Asthma    At risk for sleep apnea    STOP-BANG = 4  SENT TO PCP 05-24-2013   Cataract 2023   Eczema    Edema of foot    Finger wound, simple, open    left middle index finger - stitches and dressing occured on 09/22/2016- followed by Dr Daws at [facility]    H/O hiatal hernia    History of kidney stones    HOH (hard of hearing)    Hyperlipidemia    Influenza A    Influenza B    symptoms started 05-16-2013/  dx 05-17-2013 by pcp   Left ankle injury    twisted left ankle    Left ureteral calculus    Pneumonia    hx of    Rash     right leg- calf -    Wears dentures    bottom    Past Surgical History:  Procedure Laterality Date   ANTERIOR CERVICAL DECOMP/DISCECTOMY FUSION  01/27/2007   C6  --- C7   COLONOSCOPY  08/25/2016   TA   CYSTOSCOPY WITH URETEROSCOPY Left 05/26/2013   Procedure: CYSTOSCOPY WITH URETEROSCOPY, URETHERAL DILATATION,LEFT RETROGRADE, LASER LITHOTRIPSY, STENT PLACMENT, LEFT URETEROSCOPY;  Surgeon: Kathi Ludwig, MD;  Location: [facility];  Service: Urology;  Laterality: Left;   FINGER FRACTURE SURGERY  AS CHILD   GANGLION CYST EXCISION Left 2023   HOLMIUM LASER APPLICATION Left 05/26/2013   Procedure: HOLMIUM LASER APPLICATION;  Surgeon: Kathi Ludwig, MD;  Location: [facility];  Service: Urology;  Laterality: Left;   JOINT REPLACEMENT  2922   LIPOMA EXCISION  05/17/2012   Procedure: EXCISION LIPOMA;  Surgeon: Atilano Ina, MD,FACS;  Location: [facility];  Service: General;  Laterality: Left;  incisional biopsy of left shoulder soft tissue mass   NASAL FRACTURE SURGERY  AS CHILD   ORIF ULNAR FRACTURE Right AS CHILD   ROTATOR CUFF REPAIR Right    right shoulder June2018 by [facility] Ronald A Gioffre   TOTAL KNEE ARTHROPLASTY Right 12/05/2018   Procedure: LEFT ARTHROSCOPY, CHONDROPLASTY;  Surgeon: Ollen Gross, MD;  Location: [facility];  Service: Orthopedics;  Laterality: Right;    TOTAL KNEE REVISION Right 12/11/2020   Procedure: Right knee polyethylene revision;  Surgeon: Ollen Gross, MD;  Location: [facility];  Service: Orthopedics;  Laterality: Right;    Allergies: Sesame oil and Tobramycin  Medications: Prior to Admission medications   Medication Sig Start Date End Date Taking? Authorizing Provider  albuterol (VENTOLIN HFA) 108 (90 Base) MCG/ACT inhaler TAKE 2 PUFFS BY MOUTH EVERY 6 HOURS AS NEEDED FOR WHEEZE OR SHORTNESS OF BREATH 06/18/21   Ardith Dark, MD  Cyanocobalamin (VITAMIN B-12) 1000 MCG/15ML  LIQD Take 1,000 mcg by mouth daily.    [provider]  diclofenac Sodium (VOLTAREN) 1 % GEL APPLY 4 G TOPICALLY 4 (FOUR) TIMES DAILY AS NEEDED.    [provider]  fluocinonide ointment (LIDEX) 0.05 % Apply topically 2 (two) times daily. 02/01/23   Everrett Coombe, DO  ketoconazole (NIZORAL) 2 % cream  11/18/22   [provider]  meloxicam (MOBIC) 15 MG tablet ONE TAB ORALLY EVERY 24 HOURS WITH A MEAL FOR 2 WEEKS, THEN ONCE EVERY 24 HOURS AS NEEDED FOR PAIN. 02/23/23   Everrett Coombe, DO  terbinafine (LAMISIL) 250 MG tablet Take 250 mg by mouth daily. 11/18/22   [provider]     Family History  Problem Relation Age of Onset   Arthritis Other    Hypertension Other    Osteoporosis Other    Heart disease Other    Lung disease Other    Prostate cancer Father    Hearing loss Father    Heart disease Sister    Asthma Mother    Colon cancer Neg Hx    Colon polyps Neg Hx    Esophageal cancer Neg Hx    Kidney disease Neg Hx    Rectal cancer Neg Hx    Stomach cancer Neg Hx     Social History   Socioeconomic History   Marital status: Married    Spouse name: Juniel Bernie   Number of children: 2   Years of education: 13   Highest education level: Some college, no degree  Occupational History   Occupation: Nurse, learning disability   Occupation: Retired  Tobacco Use   Smoking status: Never    Passive exposure: Never   Smokeless tobacco: Never  Vaping Use   Vaping status: Never Used  Substance and Sexual Activity   Alcohol use: Yes    Alcohol/week: 21.0 standard drinks of alcohol    Types: 21 Cans of beer per week    Comment: beer- 0-4 beers day sometimes   Drug use: Never   Sexual activity: Not Currently    Partners: Female  Other Topics Concern   Not on file  Social History Narrative   Lives with his wife. He has two children. He enjoys wood working, Engineer, water.   Social Determinants of Health   Financial Resource Strain: Low  Risk  (12/01/2022)   Overall Financial Resource Strain (CARDIA)    Difficulty of Paying Living Expenses: Not hard at all  Food Insecurity: No Food Insecurity (12/01/2022)   Hunger Vital Sign    Worried About Running Out of Food in the Last Year: Never true    Ran Out of Food in the Last Year: Never true  Transportation Needs: No Transportation Needs (12/01/2022)   PRAPARE - Administrator, Civil Service (Medical): No    Lack of Transportation (Non-Medical):  No  Physical Activity: Insufficiently Active (12/01/2022)   Exercise Vital Sign    Days of Exercise per Week: 4 days    Minutes of Exercise per Session: 20 min  Stress: No Stress Concern Present (12/01/2022)   [employer] of Occupational Health - Occupational Stress Questionnaire    Feeling of Stress : Not at all  Social Connections: Moderately Integrated (12/01/2022)   Social Connection and Isolation Panel [NHANES]    Frequency of Communication with Friends and Family: More than three times a week    Frequency of Social Gatherings with Friends and Family: Twice a week    Attends Religious Services: More than 4 times per year    Active Member of [employer] or Organizations: No    Attends Banker Meetings: Never    Marital Status: Married     Review of Systems: A 12 point ROS discussed and pertinent positives are indicated in the HPI above.  All other systems are negative.  Vital Signs: There were no vitals taken for this visit.  Advance Care Plan: The advanced care plan/surrogate decision maker was discussed at the time of visit and documented in the medical record.   No physical exam was performed in lieu of virtual telephone visit.   Imaging: Bilateral knees (11/05/22)   Labs:  CBC: Recent Labs    09/17/22 1136  WBC 7.8  HGB 15.7  HCT 45.7  PLT 193    COAGS: No results for input(s): "INR", "APTT" in the last 8760 hours.  BMP: Recent Labs    09/17/22 1136  NA 137  K 4.3  CL 103  CO2 25   GLUCOSE 126*  BUN 18  CALCIUM 8.8  CREATININE 1.05    LIVER FUNCTION TESTS: Recent Labs    09/17/22 1136  BILITOT 1.0  AST 14  ALT 16  PROT 6.4    TUMOR MARKERS: No results for input(s): "AFPTM", "CEA", "CA199", "CHROMGRNA" in the last 8760 hours.  Assessment and Plan: 72 year old male with a history of right knee osteoarthritis status post right total knee arthroplasty in 2020 with revision in 2022 by Dr. Lequita Halt. He has persistent right knee pain unresponsive to conservative measures.  He would be an excellent candidate for geniculate artery embolization in hopes to decrease synovial hyperemia after arthroplasty.  We discussed the rationale, procedure steps, and periprocedural expectations including risks and benefits in detail.  He would like to proceed.  Plan for right geniculate artery embolization at [facility] with moderate sedation, plan for antegrade right femoral artery approach.  Britt Bottom, Roux Georgetta Haber JF, [place] V, [address] [place] R, Fontas E, Rudel A, [place] J. Effectiveness of Geniculate Artery Embolization for Chronic Pain after Total Knee Replacement-A Pilot Study. J Vasc Interv Radiol. 2023 Oct;34(10):1725-1733. doi: 10.1016/j.jvir.2023.06.026. Epub 2023 Jun 28. PMID: [id number].   Marliss Coots, MD Pager: [phone]    I spent a total of  40 Minutes   in virtual telephone clinical consultation, greater than 50% of which was counseling/coordinating care for right knee pain.

## 2023-03-12 ENCOUNTER — Ambulatory Visit
Admission: RE | Admit: 2023-03-12 | Discharge: 2023-03-12 | Disposition: A | Discharge status: Home / Self Care | Payer: PPO | Source: Ambulatory Visit | Attending: Sports Medicine | Admitting: Sports Medicine

## 2023-03-12 DIAGNOSIS — T84012S Broken internal right knee prosthesis, sequela: Secondary | ICD-10-CM

## 2023-03-12 HISTORY — PX: IR RADIOLOGIST EVAL & MGMT: IMG5224

## 2023-03-15 ENCOUNTER — Ambulatory Visit (HOSPITAL_COMMUNITY)
Admission: RE | Admit: 2023-03-15 | Discharge: 2023-03-15 | Disposition: A | Discharge status: Home / Self Care | Payer: PPO | Source: Ambulatory Visit | Attending: Student | Admitting: Student

## 2023-03-15 DIAGNOSIS — M65962 Unspecified synovitis and tenosynovitis, left lower leg: Secondary | ICD-10-CM | POA: Diagnosis not present

## 2023-03-15 DIAGNOSIS — Z96651 Presence of right artificial knee joint: Secondary | ICD-10-CM | POA: Insufficient documentation

## 2023-03-15 DIAGNOSIS — M25361 Other instability, right knee: Secondary | ICD-10-CM | POA: Diagnosis not present

## 2023-03-15 DIAGNOSIS — M1712 Unilateral primary osteoarthritis, left knee: Secondary | ICD-10-CM | POA: Diagnosis not present

## 2023-03-15 MED ORDER — TECHNETIUM TC 99M MEDRONATE IV KIT
20.0000 | PACK | Freq: Once | INTRAVENOUS | Status: AC
  Administered 2023-03-15: 22 via INTRAVENOUS

## 2023-03-16 ENCOUNTER — Encounter: Payer: Self-pay | Admitting: Rehabilitative and Restorative Service Providers"

## 2023-03-16 ENCOUNTER — Ambulatory Visit: Payer: PPO | Attending: Sports Medicine | Admitting: Rehabilitative and Restorative Service Providers"

## 2023-03-16 DIAGNOSIS — R293 Abnormal posture: Secondary | ICD-10-CM | POA: Insufficient documentation

## 2023-03-16 DIAGNOSIS — M6281 Muscle weakness (generalized): Secondary | ICD-10-CM | POA: Insufficient documentation

## 2023-03-16 DIAGNOSIS — R29898 Other symptoms and signs involving the musculoskeletal system: Secondary | ICD-10-CM | POA: Diagnosis present

## 2023-03-16 DIAGNOSIS — M25812 Other specified joint disorders, left shoulder: Secondary | ICD-10-CM | POA: Diagnosis present
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
## 2023-03-31 ENCOUNTER — Other Ambulatory Visit: Payer: Self-pay | Admitting: Interventional Radiology

## 2023-03-31 DIAGNOSIS — T84012S Broken internal right knee prosthesis, sequela: Secondary | ICD-10-CM

## 2023-04-06 ENCOUNTER — Other Ambulatory Visit: Payer: Self-pay | Admitting: Family Medicine

## 2023-04-06 NOTE — Telephone Encounter (Signed)
Copied from CRM [id number]. Topic: Clinical - Medication Refill >> Apr 06, 2023  3:33 PM Fuller Mandril wrote: Most Recent Primary Care Visit:  Provider: Monica Becton  Department: [facility]  Visit Type: OFFICE VISIT  Date: 02/11/2023  Medication: albuterol (VENTOLIN HFA) 108 (90 Base) MCG/ACT inhaler  Has the patient contacted their pharmacy? No (Agent: If no, request that the patient contact the pharmacy for the refill. If patient does not wish to contact the pharmacy document the reason why and proceed with request.) (Agent: If yes, when and what did the pharmacy advise?)  Is this the correct pharmacy for this prescription? Yes If no, delete pharmacy and type the correct one.  This is the patient's preferred pharmacy:  [id number] - [place], [place] - [address] [place] [address] Phone: [phone] Fax: [phone]   Has the prescription been filled recently? No  Is the patient out of the medication? Yes  Has the patient been seen for an appointment in the last year OR does the patient have an upcoming appointment? Yes  Can we respond through MyChart? No  Agent: Please be advised that Rx refills may take up to 3 business days. We ask that you follow-up with your pharmacy.

## 2023-04-07 MED ORDER — ALBUTEROL SULFATE HFA 108 (90 BASE) MCG/ACT IN AERS: 2.0000 | INHALATION_SPRAY | Freq: Four times a day (QID) | RESPIRATORY_TRACT | 1 refills | Status: DC | PRN

## 2023-04-07 NOTE — Telephone Encounter (Signed)
Last appointment 02/01/2023  Last filled 06/18/2021  Upcoming appointment 12/06/2023

## 2023-04-09 DIAGNOSIS — Z4889 Encounter for other specified surgical aftercare: Secondary | ICD-10-CM | POA: Diagnosis not present

## 2023-04-09 DIAGNOSIS — Z96652 Presence of left artificial knee joint: Secondary | ICD-10-CM | POA: Diagnosis not present

## 2023-04-13 DIAGNOSIS — H26491 Other secondary cataract, right eye: Secondary | ICD-10-CM | POA: Diagnosis not present

## 2023-04-13 DIAGNOSIS — Z961 Presence of intraocular lens: Secondary | ICD-10-CM | POA: Diagnosis not present

## 2023-04-13 DIAGNOSIS — H35371 Puckering of macula, right eye: Secondary | ICD-10-CM | POA: Diagnosis not present

## 2023-04-13 DIAGNOSIS — H26493 Other secondary cataract, bilateral: Secondary | ICD-10-CM | POA: Diagnosis not present

## 2023-04-13 DIAGNOSIS — H18413 Arcus senilis, bilateral: Secondary | ICD-10-CM | POA: Diagnosis not present

## 2023-04-15 ENCOUNTER — Ambulatory Visit (INDEPENDENT_AMBULATORY_CARE_PROVIDER_SITE_OTHER): Payer: PPO | Admitting: Family Medicine

## 2023-04-15 ENCOUNTER — Encounter: Payer: Self-pay | Admitting: Family Medicine

## 2023-04-15 VITALS — BP 138/89 | HR 72 | Temp 98.5°F | Ht 71.0 in | Wt 228.0 lb

## 2023-04-15 DIAGNOSIS — R0981 Nasal congestion: Secondary | ICD-10-CM

## 2023-04-15 LAB — POCT INFLUENZA A/B
Influenza A, POC: NEGATIVE
Influenza B, POC: NEGATIVE

## 2023-04-15 LAB — POC COVID19 BINAXNOW: SARS Coronavirus 2 Ag: NEGATIVE

## 2023-04-15 NOTE — Patient Instructions (Signed)
Recommend nasal saline spray or irrigation daily to clean out any mucus or drainage. Run coolmist humidifier at night to keep the air humidified. To use Tylenol or ibuprofen as needed for headache and pressure. Call if not feeling at least some better by Monday.

## 2023-04-15 NOTE — Progress Notes (Signed)
   Acute Office Visit  Subjective:     Patient ID: [name], male    DOB: 29-Jan-1951, 72 y.o.   MRN: [id number]  Chief Complaint  Patient presents with   Nasal Congestion    HPI Patient is in today for runny nose and nasal congestion for about 2 days.  No new cough.  No sore throat.  His right ear has been hurting a little bit.  No fever or chills.  Positive sick contact.No GI sxs.    ROS      Objective:    BP 138/89   Pulse 72   Temp 98.5 F (36.9 C) (Oral)   Ht 5\' 11"  (1.803 m)   Wt 228 lb (103.4 kg)   SpO2 97%   BMI 31.80 kg/m    Physical Exam Constitutional:      Appearance: Normal appearance.  HENT:     Head: Normocephalic and atraumatic.     Right Ear: Tympanic membrane, ear canal and external ear normal. There is no impacted cerumen.     Left Ear: Tympanic membrane, ear canal and external ear normal. There is no impacted cerumen.     Nose: Nose normal.     Mouth/Throat:     Pharynx: Oropharynx is clear.  Eyes:     Conjunctiva/sclera: Conjunctivae normal.  Cardiovascular:     Rate and Rhythm: Normal rate and regular rhythm.  Pulmonary:     Effort: Pulmonary effort is normal.     Breath sounds: Normal breath sounds.  Musculoskeletal:     Cervical back: Neck supple. No tenderness.  Lymphadenopathy:     Cervical: No cervical adenopathy.  Skin:    General: Skin is warm and dry.  Neurological:     Mental Status: He is alert and oriented to person, place, and time.  Psychiatric:        Mood and Affect: Mood normal.     Results for orders placed or performed in visit on 04/15/23  POC COVID-19  Result Value Ref Range   SARS Coronavirus 2 Ag Negative Negative  POCT Influenza A/B  Result Value Ref Range   Influenza A, POC Negative Negative   Influenza B, POC Negative Negative        Assessment & Plan:   Problem List Items Addressed This Visit   None Visit Diagnoses       Nasal congestion    -  Primary   Relevant Orders   POC COVID-19  (Completed)   POCT Influenza A/B (Completed)       Negative for flu and COVID.  Most consistent with viral illness.  If not feeling better after the weekend please give [place] call back.  Recommend nasal saline irrigation and symptomatic care.  No orders of the defined types were placed in this encounter.   Return if symptoms worsen or fail to improve.  Nani Gasser, MD

## 2023-04-16 ENCOUNTER — Other Ambulatory Visit: Payer: PPO

## 2023-04-19 ENCOUNTER — Telehealth: Payer: Self-pay

## 2023-04-19 DIAGNOSIS — U071 COVID-19: Secondary | ICD-10-CM | POA: Diagnosis not present

## 2023-04-19 DIAGNOSIS — R42 Dizziness and giddiness: Secondary | ICD-10-CM | POA: Diagnosis not present

## 2023-04-19 DIAGNOSIS — J948 Other specified pleural conditions: Secondary | ICD-10-CM | POA: Diagnosis not present

## 2023-04-19 DIAGNOSIS — R55 Syncope and collapse: Secondary | ICD-10-CM | POA: Diagnosis not present

## 2023-04-19 DIAGNOSIS — J929 Pleural plaque without asbestos: Secondary | ICD-10-CM | POA: Diagnosis not present

## 2023-04-19 DIAGNOSIS — R072 Precordial pain: Secondary | ICD-10-CM | POA: Diagnosis not present

## 2023-04-19 DIAGNOSIS — R059 Cough, unspecified: Secondary | ICD-10-CM | POA: Diagnosis not present

## 2023-04-19 DIAGNOSIS — I491 Atrial premature depolarization: Secondary | ICD-10-CM | POA: Diagnosis not present

## 2023-04-19 DIAGNOSIS — I959 Hypotension, unspecified: Secondary | ICD-10-CM | POA: Diagnosis not present

## 2023-04-19 DIAGNOSIS — I4510 Unspecified right bundle-branch block: Secondary | ICD-10-CM | POA: Diagnosis not present

## 2023-04-19 NOTE — Telephone Encounter (Signed)
Copied from CRM [id number]. Topic: Clinical - Medical Advice >> Apr 19, 2023  4:00 PM Carlatta H wrote: Reason for CRM: Patients wife called to see if there are any medications that can be prescribed due to passing out this morning and going to the hospital//Patient was seen at [facility] and was diagnosed with Covid//

## 2023-04-20 ENCOUNTER — Ambulatory Visit: Payer: Self-pay | Admitting: Family Medicine

## 2023-04-20 NOTE — Telephone Encounter (Signed)
Pt calling wanting a  cough syrup they usually get from PCP "about once a year when I get this".  Pt unsure what the medication is, but thinks "hydro something".  Pt advised that RN will inform PCP, however, with holiday schedule the earliest pt would hear from PCP would be 12/26. Pt understood, asked about OTC medications. Pt advised to read ingredient lists and keep track of amount of duplicate medications, so as to not go over the recommended dosing of ingredients. Pt understood.  Pt was just discharged from the ED where he was diagnosed with Covid, advised to contact ED as they potentially might write for antiviral as he was diagnosed by them.  Advised to call us back if he has concerns about illness or s/s that he would like to discuss with [place].  Advised to seek ED if he has difficulty breathing.   Copied from CRM [id number]. Topic: Clinical - Pink Word Triage >> Apr 20, 2023  9:43 AM Shelah Lewandowsky wrote: Reason for Triage: Both patient and wife have tested positive for Covid and need something for the cough. Reason for Disposition  [1] Caller has NON-URGENT medicine question about med that PCP prescribed AND [2] triager unable to answer question  Answer Assessment - Initial Assessment Questions 1. DRUG NAME: "What medicine do you need to have refilled?"     Cough syrup that I have had in the past 2. REFILLS REMAINING: "How many refills are remaining?" (Note: The label on the medicine or pill bottle will show how many refills are remaining. If there are no refills remaining, then a renewal may be needed.)     Pt unsure, no cough syrup visible on his med list 4. PRESCRIBING HCP: "Who prescribed it?" Reason: If prescribed by specialist, call should be referred to that group.     Dr cody usually prescribes 5. SYMPTOMS: "Do you have any symptoms?"     cough  Protocols used: Medication Refill and Renewal Call-A-AH

## 2023-04-22 ENCOUNTER — Other Ambulatory Visit: Payer: Self-pay | Admitting: Family Medicine

## 2023-04-22 MED ORDER — HYDROCODONE BIT-HOMATROP MBR 5-1.5 MG/5ML PO SOLN: 5.0000 mL | Freq: Four times a day (QID) | ORAL | 0 refills | Status: DC | PRN

## 2023-04-22 MED ORDER — NIRMATRELVIR/RITONAVIR (PAXLOVID)TABLET: 3.0000 | ORAL_TABLET | Freq: Two times a day (BID) | ORAL | 0 refills | Status: AC

## 2023-04-22 NOTE — Telephone Encounter (Signed)
Spoke with patient  States nothing was prescribed in ER - was told to contact PCP for prescription He tested positive for Covid  Feeling somewhat better but not 100%  Still having congestion , easily fatigued,  slight productive cough producing yellow phlegm Patient's wife Elease Hashimoto also tested positive for Covid.

## 2023-04-22 NOTE — Telephone Encounter (Signed)
Patient informed. 

## 2023-05-17 ENCOUNTER — Ambulatory Visit (INDEPENDENT_AMBULATORY_CARE_PROVIDER_SITE_OTHER): Payer: PPO | Admitting: Family Medicine

## 2023-05-17 ENCOUNTER — Ambulatory Visit: Payer: Self-pay | Admitting: Family Medicine

## 2023-05-17 ENCOUNTER — Ambulatory Visit: Payer: PPO

## 2023-05-17 ENCOUNTER — Encounter: Payer: Self-pay | Admitting: Family Medicine

## 2023-05-17 VITALS — BP 142/87 | HR 66 | Ht 71.0 in | Wt 226.0 lb

## 2023-05-17 DIAGNOSIS — I517 Cardiomegaly: Secondary | ICD-10-CM | POA: Diagnosis not present

## 2023-05-17 DIAGNOSIS — R059 Cough, unspecified: Secondary | ICD-10-CM

## 2023-05-17 DIAGNOSIS — J209 Acute bronchitis, unspecified: Secondary | ICD-10-CM | POA: Diagnosis not present

## 2023-05-17 DIAGNOSIS — R062 Wheezing: Secondary | ICD-10-CM

## 2023-05-17 DIAGNOSIS — I771 Stricture of artery: Secondary | ICD-10-CM | POA: Diagnosis not present

## 2023-05-17 DIAGNOSIS — J929 Pleural plaque without asbestos: Secondary | ICD-10-CM | POA: Diagnosis not present

## 2023-05-17 MED ORDER — HYDROCODONE BIT-HOMATROP MBR 5-1.5 MG/5ML PO SOLN: 5.0000 mL | Freq: Four times a day (QID) | ORAL | 0 refills | Status: DC | PRN

## 2023-05-17 MED ORDER — PREDNISONE 20 MG PO TABS: 20.0000 mg | ORAL_TABLET | Freq: Two times a day (BID) | ORAL | 0 refills | Status: AC

## 2023-05-17 MED ORDER — ALBUTEROL SULFATE HFA 108 (90 BASE) MCG/ACT IN AERS: 2.0000 | INHALATION_SPRAY | Freq: Four times a day (QID) | RESPIRATORY_TRACT | 1 refills | Status: DC | PRN

## 2023-05-17 MED ORDER — IPRATROPIUM-ALBUTEROL 0.5-2.5 (3) MG/3ML IN SOLN
3.0000 mL | Freq: Once | RESPIRATORY_TRACT | Status: AC
  Administered 2023-05-17: 3 mL via RESPIRATORY_TRACT

## 2023-05-17 MED ORDER — METHYLPREDNISOLONE SODIUM SUCC 125 MG IJ SOLR
125.0000 mg | Freq: Once | INTRAMUSCULAR | Status: AC
  Administered 2023-05-17: 125 mg via INTRAMUSCULAR

## 2023-05-17 NOTE — Progress Notes (Signed)
[name] - 73 y.o. male MRN [id number]  Date of birth: [birth year]  Subjective Chief Complaint  Patient presents with   Wheezing    HPI [name] is a 73 y.o. male here today with complaint of dyspnea and wheezing.  He had COVID a few weeks ago but the symptoms did not really start until about a week ago.Marland Kitchen  He denies chest pain but feels tight when trying to take a deep breath..  He has not had fever or chills.  Cough is relatively nonproductive.  He has been using leftover albuterol cough syrup from when he had COVID.  He is a non-smoker.  ROS:  A comprehensive ROS was completed and negative except as noted per HPI  Allergies  Allergen Reactions   Sesame Oil Itching and Swelling   Tobramycin Itching and Swelling    Used in an eye oint-reaction    Past Medical History:  Diagnosis Date   Allergy    Anxiety    Arthritis    Asthma    At risk for sleep apnea    STOP-BANG = 4  SENT TO PCP 05-24-2013   Cataract 2023   Eczema    Edema of foot    Finger wound, simple, open    left middle index finger - stitches and dressing occured on 09/22/2016- followed by Dr Daws at [facility]    H/O hiatal hernia    History of kidney stones    HOH (hard of hearing)    Hyperlipidemia    Influenza A    Influenza B    symptoms started 05-16-2013/  dx 05-17-2013 by pcp   Left ankle injury    twisted left ankle    Left ureteral calculus    Pneumonia    hx of    Rash    right leg- calf -    Wears dentures    bottom    Past Surgical History:  Procedure Laterality Date   ANTERIOR CERVICAL DECOMP/DISCECTOMY FUSION  01/27/2007   C6  --- C7   COLONOSCOPY  08/25/2016   TA   CYSTOSCOPY WITH URETEROSCOPY Left 05/26/2013   Procedure: CYSTOSCOPY WITH URETEROSCOPY, URETHERAL DILATATION,LEFT RETROGRADE, LASER LITHOTRIPSY, STENT PLACMENT, LEFT URETEROSCOPY;  Surgeon: Kathi Ludwig, MD;  Location: [facility];  Service: Urology;  Laterality: Left;   FINGER FRACTURE  SURGERY  AS CHILD   GANGLION CYST EXCISION Left 2023   HOLMIUM LASER APPLICATION Left 05/26/2013   Procedure: HOLMIUM LASER APPLICATION;  Surgeon: Kathi Ludwig, MD;  Location: [facility];  Service: Urology;  Laterality: Left;   IR RADIOLOGIST EVAL & MGMT  03/12/2023   JOINT REPLACEMENT  2922   LIPOMA EXCISION  05/17/2012   Procedure: EXCISION LIPOMA;  Surgeon: Atilano Ina, MD,FACS;  Location: [facility];  Service: General;  Laterality: Left;  incisional biopsy of left shoulder soft tissue mass   NASAL FRACTURE SURGERY  AS CHILD   ORIF ULNAR FRACTURE Right AS CHILD   ROTATOR CUFF REPAIR Right    right shoulder June2018 by [facility]y Fast A [facility]io[facility]re   TOTAL KNEE ARTHROPLASTY Right 12/05/2018   Procedure: LEFT ARTHROSCOPY, CHONDROPLASTY;  Surgeon: Ollen Gross, MD;  Location: [facility];  Service: Orthopedics;  Laterality: Right;    TOTAL KNEE REVISION Right 12/11/2020   Procedure: Right knee polyethylene revision;  Surgeon: Ollen Gross, MD;  Location: [facility];  Service: Orthopedics;  Laterality: Right;    Social History  Socioeconomic History   Marital status: Married    Spouse name: Oreste Castiglioni   Number of children: 2   Years of education: 13   Highest education level: Some college, no degree  Occupational History   Occupation: Nurse, learning disability   Occupation: Retired  Tobacco Use   Smoking status: Never    Passive exposure: Never   Smokeless tobacco: Never  Vaping Use   Vaping status: Never Used  Substance and Sexual Activity   Alcohol use: Yes    Alcohol/week: 21.0 standard drinks of alcohol    Types: 21 Cans of beer per week    Comment: beer- 0-4 beers day sometimes   Drug use: Never   Sexual activity: Not Currently    Partners: Female  Other Topics Concern   Not on file  Social History Narrative   Lives with his wife. He has two children. He enjoys wood working, Engineer, water.   Social Drivers  of Corporate investment banker Strain: Low Risk  (12/01/2022)   Overall Financial Resource Strain (CARDIA)    Difficulty of Paying Living Expenses: Not hard at all  Food Insecurity: No Food Insecurity (12/01/2022)   Hunger Vital Sign    Worried About Running Out of Food in the Last Year: Never true    Ran Out of Food in the Last Year: Never true  Transportation Needs: No Transportation Needs (12/01/2022)   PRAPARE - Administrator, Civil Service (Medical): No    Lack of Transportation (Non-Medical): No  Physical Activity: Insufficiently Active (12/01/2022)   Exercise Vital Sign    Days of Exercise per Week: 4 days    Minutes of Exercise per Session: 20 min  Stress: No Stress Concern Present (12/01/2022)   [employer] of Occupational Health - Occupational Stress Questionnaire    Feeling of Stress : Not at all  Social Connections: Moderately Integrated (12/01/2022)   Social Connection and Isolation Panel [NHANES]    Frequency of Communication with Friends and Family: More than three times a week    Frequency of Social Gatherings with Friends and Family: Twice a week    Attends Religious Services: More than 4 times per year    Active Member of [employer] or Organizations: No    Attends Engineer, structural: Never    Marital Status: Married    Family History  Problem Relation Age of Onset   Arthritis Other    Hypertension Other    Osteoporosis Other    Heart disease Other    Lung disease Other    Prostate cancer Father    Hearing loss Father    Heart disease Sister    Asthma Mother    Colon cancer Neg Hx    Colon polyps Neg Hx    Esophageal cancer Neg Hx    Kidney disease Neg Hx    Rectal cancer Neg Hx    Stomach cancer Neg Hx     Health Maintenance  Topic Date Due   Zoster Vaccines- Shingrix (1 of 2) Never done   COVID-19 Vaccine (5 - 2024-25 season) 12/27/2022   Medicare Annual Wellness (AWV)  12/01/2023   DTaP/Tdap/Td (5 - Td or Tdap) 09/23/2026    Pneumonia Vaccine 75+ Years old  Completed   INFLUENZA VACCINE  Completed   Hepatitis C Screening  Completed   HPV VACCINES  Aged Out   Colonoscopy  Discontinued     ----------------------------------------------------------------------------------------------------------------------------------------------------------------------------------------------------------------- Physical Exam BP (!) 142/87   Pulse 66  Ht 5\' 11"  (1.803 m)   Wt 226 lb (102.5 kg)   SpO2 96%   BMI 31.52 kg/m   Physical Exam Constitutional:      Appearance: Normal appearance.  HENT:     Head: Normocephalic and atraumatic.  Cardiovascular:     Rate and Rhythm: Normal rate and regular rhythm.  Pulmonary:     Effort: Pulmonary effort is normal.     Breath sounds: Wheezing (Diffuse expiratory wheezing) present.  Neurological:     Mental Status: He is alert.  Psychiatric:        Mood and Affect: Mood normal.        Behavior: Behavior normal.     ------------------------------------------------------------------------------------------------------------------------------------------------------------------------------------------------------------------- Assessment and Plan  Acute bronchitis Given DuoNeb in clinic today.  Some improvement after this.  Solu-Medrol injection given as well.  Will continue steroid burst with prednisone for the next 5 days starting tomorrow.  Chest x-ray ordered.  Albuterol and Hydromet cough syrup renewed.  Red flags reviewed with him today.   Meds ordered this encounter  Medications   ipratropium-albuterol (DUONEB) 0.5-2.5 (3) MG/3ML nebulizer solution 3 mL   predniSONE (DELTASONE) 20 MG tablet    Sig: Take 1 tablet (20 mg total) by mouth 2 (two) times daily with a meal for 5 days.    Dispense:  10 tablet    Refill:  0   HYDROcodone bit-homatropine (HYDROMET) 5-1.5 MG/5ML syrup    Sig: Take 5 mLs by mouth every 6 (six) hours as needed for cough.    Dispense:  120 mL     Refill:  0   albuterol (VENTOLIN HFA) 108 (90 Base) MCG/ACT inhaler    Sig: Inhale 2 puffs into the lungs every 6 (six) hours as needed for wheezing or shortness of breath.    Dispense:  8.5 each    Refill:  1   methylPREDNISolone sodium succinate (SOLU-MEDROL) 125 mg/2 mL injection 125 mg    No follow-ups on file.    This visit occurred during the SARS-CoV-2 public health emergency.  Safety protocols were in place, including screening questions prior to the visit, additional usage of staff PPE, and extensive cleaning of exam room while observing appropriate contact time as indicated for disinfecting solutions.

## 2023-05-17 NOTE — Patient Instructions (Signed)
Start prednisone tomorrow.  Cough syrup and inhaler as needed.  We'll be in touch with xray results.

## 2023-05-17 NOTE — Assessment & Plan Note (Signed)
Given DuoNeb in clinic today.  Some improvement after this.  Solu-Medrol injection given as well.  Will continue steroid burst with prednisone for the next 5 days starting tomorrow.  Chest x-ray ordered.  Albuterol and Hydromet cough syrup renewed.  Red flags reviewed with him today.

## 2023-05-17 NOTE — Telephone Encounter (Signed)
  Chief Complaint: Wheezing  Symptoms: Wheezing, sometimes hard to catch breath Frequency: intermittent since Saturday Pertinent Negatives: Patient denies SOB, other symptoms, fever Disposition: [] ED /[] Urgent Care (no appt availability in office) / [x] Appointment(In office/virtual)/ []  [place] [facility]/ [] Home Care/ [] Refused Recommended Disposition /[] [place] [facility]/ []  Follow-up with PCP Additional Notes: Patient c/o wheezing and sometimes hard to catch breath, says he has a cough that cough syrup helps. Does have hx of asthma and uses PRN Ventolin inhaler. Patient states he had Covid around Christmas, but does not have similar symptoms now. Patient in no distress, able to speak in full sentances. Due to new onset and multiple ILI being reported this time of year, advised OV for further assessment and to R/O more serious illness. Patient agrees, appt made for today with PCP     Copied from CRM [id number]. Topic: Clinical - Red Word Triage >> May 17, 2023  8:14 AM Nila Nephew wrote: Red Word that prompted transfer to Nurse Triage: Wheezing at night, feels like he sometimes cannot catch his breath, Reason for Disposition  [1] Chronic wheezing AND [2] mild  Answer Assessment - Initial Assessment Questions 1. ONSET: "When did the wheezing begin?"      2 days ago 2. RESPIRATORY STATUS: "Describe your child's breathing. What does it sound like?" (e.g., wheezing, stridor, grunting, weak cry, unable to speak, retractions, rapid rate, cyanosis)     wheezing 3. FEEDING STATUS:  "Is your child having difficulty with breast or bottle feeding?"  If so, ask:  "How long can he feed without stopping to take a breath?"     N/a 4. ASSOCIATED VIRAL INFECTION: "Does your child also have a cold, cough or fever?"      Had covid at Christmas 5. ASSOCIATED ALLERGIES: "Does your child also have any allergies?"      no 6. RECURRENT EPISODES: "Has your child had other attacks of wheezing?" If so, ask:  "When was the last time?" and "What happened that time?"      N/a 7. FAMILY HISTORY: "Does anyone in your family have asthma?"      Brochiol asthma 8. CHILD'S APPEARANCE: "How sick is your child acting?" " What is he doing right now?" If asleep, ask: "How was he acting before he went to sleep?"     N/a   Note to Triager - Respiratory Distress: Always rule out respiratory distress (also known as working hard to breathe or shortness of breath). Listen for grunting, stridor, wheezing, tachypnea in these calls. How to assess: Listen to the child's breathing early in your assessment. Reason: What you hear is often more valid than the caller's answers to your triage questions.  Protocols used: Wheezing - Other Than Asthma-P-AH

## 2023-05-17 NOTE — Telephone Encounter (Signed)
Pt scheduled for today 05/17/2023

## 2023-05-19 ENCOUNTER — Encounter: Payer: Self-pay | Admitting: Family Medicine

## 2023-05-20 DIAGNOSIS — H26492 Other secondary cataract, left eye: Secondary | ICD-10-CM | POA: Diagnosis not present

## 2023-05-27 ENCOUNTER — Ambulatory Visit: Payer: PPO | Admitting: Family Medicine

## 2023-05-27 ENCOUNTER — Ambulatory Visit: Payer: PPO

## 2023-05-27 ENCOUNTER — Ambulatory Visit: Payer: Self-pay | Admitting: Family Medicine

## 2023-05-27 ENCOUNTER — Ambulatory Visit
Admission: RE | Admit: 2023-05-27 | Discharge: 2023-05-27 | Disposition: A | Discharge status: Home / Self Care | Payer: PPO | Source: Ambulatory Visit | Attending: Family Medicine | Admitting: Family Medicine

## 2023-05-27 VITALS — BP 142/79 | HR 80 | Temp 98.3°F | Resp 17

## 2023-05-27 DIAGNOSIS — R059 Cough, unspecified: Secondary | ICD-10-CM

## 2023-05-27 DIAGNOSIS — R0689 Other abnormalities of breathing: Secondary | ICD-10-CM | POA: Diagnosis not present

## 2023-05-27 DIAGNOSIS — J40 Bronchitis, not specified as acute or chronic: Secondary | ICD-10-CM | POA: Diagnosis not present

## 2023-05-27 DIAGNOSIS — J929 Pleural plaque without asbestos: Secondary | ICD-10-CM | POA: Diagnosis not present

## 2023-05-27 DIAGNOSIS — R918 Other nonspecific abnormal finding of lung field: Secondary | ICD-10-CM | POA: Diagnosis not present

## 2023-05-27 MED ORDER — AMOXICILLIN-POT CLAVULANATE 875-125 MG PO TABS: 1.0000 | ORAL_TABLET | Freq: Two times a day (BID) | ORAL | 0 refills | Status: DC

## 2023-05-27 MED ORDER — PREDNISONE 10 MG (21) PO TBPK: ORAL_TABLET | Freq: Every day | ORAL | 0 refills | Status: DC

## 2023-05-27 MED ORDER — HYDROCODONE BIT-HOMATROP MBR 5-1.5 MG/5ML PO SOLN
5.0000 mL | Freq: Four times a day (QID) | ORAL | 0 refills | Status: DC | PRN
Start: 2023-05-27 — End: 2023-06-11

## 2023-05-27 NOTE — Telephone Encounter (Signed)
Chief Complaint: worsening cough, completed prednisone Symptoms: runny nose, productive cough, SOB with exertion Frequency: worsening since Monday, constant Pertinent Negatives: Patient denies fever, chest pain Disposition: [] ED /[x] Urgent Care (no appt availability in office) / [] Appointment(In office/virtual)/ []  [place] [facility]/ [] Home Care/ [] Refused Recommended Disposition /[] [place] [facility]/ []  Follow-up with PCP Additional Notes: Patient states he would not like to wait til 4pm for acute visit appointment as he would like to be able to come in "right now to at least get a breathing treatment". Urgent care appointment scheduled for patient at 2:30pm at [facility]. Patient states he was seen last week and improved after taking his prednisone but once completed he states his symptoms then worsened. Patient states he has continued taking his inhaler and cough syrup.  Copied from CRM [id number]. Topic: Clinical - Red Word Triage >> May 27, 2023  1:55 PM Nila Nephew wrote: Red Word that prompted transfer to Nurse Triage: Patient struggling to breathe at last appointment. Patient still struggling to breathe. Reason for Disposition  [1] MILD difficulty breathing (e.g., minimal/no SOB at rest, SOB with walking, pulse <100) AND [2] NEW-onset or WORSE than normal  Answer Assessment - Initial Assessment Questions 1. RESPIRATORY STATUS: "Describe your breathing?" (e.g., wheezing, shortness of breath, unable to speak, severe coughing)      Wife states "he can't take deep breaths, raspy breathing and coughing."  2. ONSET: "When did this breathing problem begin?"      Seen in the office last week and improved after breathing treatment, steroids and cough syrup. Worsening since Monday.  3. PATTERN "Does the difficult breathing come and go, or has it been constant since it started?"      Constant and progressively getting worse every day.  4. SEVERITY: "How bad is  your breathing?" (e.g., mild, moderate, severe)    - MILD: No SOB at rest, mild SOB with walking, speaks normally in sentences, can lie down, no retractions, pulse < 100.    - MODERATE: SOB at rest, SOB with minimal exertion and prefers to sit, cannot lie down flat, speaks in phrases, mild retractions, audible wheezing, pulse 100-120.    - SEVERE: Very SOB at rest, speaks in single words, struggling to breathe, sitting hunched forward, retractions, pulse > 120     Patient states he has avoided strenuous activity due to SOB and feels SOB with exertion.   5. RECURRENT SYMPTOM: "Have you had difficulty breathing before?" If Yes, ask: "When was the last time?" and "What happened that time?"      Denies.  6. CARDIAC HISTORY: "Do you have any history of heart disease?" (e.g., heart attack, angina, bypass surgery, angioplasty)      Denies.  7. LUNG HISTORY: "Do you have any history of lung disease?"  (e.g., pulmonary embolus, asthma, emphysema)     He states as he has gotten old he has had bronchial asthmatic type issues.  8. CAUSE: "What do you think is causing the breathing problem?"      Patient's wife states he had just gotten COVID around Christmas and it seems residual from that.  9. OTHER SYMPTOMS: "Do you have any other symptoms? (e.g., dizziness, runny nose, cough, chest pain, fever)     Productive cough, runny nose.  10. O2 SATURATION MONITOR:  "Do you use an oxygen saturation monitor (pulse oximeter) at home?" If Yes, ask: "What is your reading (oxygen level) today?" "What is your usual oxygen saturation reading?" (e.g., 95%)  Denies having one.  11. TRAVEL: "Have you traveled out of the country in the last month?" (e.g., travel history, exposures)       Denies travel; denies known exposure.  Protocols used: Breathing Difficulty-A-AH

## 2023-05-27 NOTE — ED Triage Notes (Signed)
Pt here today with wife. 5 weeks post COVID cough. Was seen on 01/20 by PCP given neb tx and steroid shot. Then 5 days oral steroids. CXR normal.  Doing albuterol and hydrocodone cough syrup prn.

## 2023-05-27 NOTE — ED Provider Notes (Signed)
[name]    CSN: [id number] Arrival date & time: 05/27/23  1433      History   Chief Complaint Chief Complaint  Patient presents with   Cough    HPI [name] is a 73 y.o. male.   HPI Pleasant 73 year old male presents with worsening cough for 10 days.  Patient is accompanied by his wife this afternoon.  Patient evaluated by PCP on 05/17/2023 for wheezing please see epic for that encounter note.  Previously patient was diagnosed with COVID-19 on 04/19/2023 please see [facility] note for that encounter.  PMH significant for HLD, hard of hearing, and history of pneumonia.  Past Medical History:  Diagnosis Date   Allergy    Anxiety    Arthritis    Asthma    At risk for sleep apnea    STOP-BANG = 4  SENT TO PCP 05-24-2013   Cataract 2023   Eczema    Edema of foot    Finger wound, simple, open    left middle index finger - stitches and dressing occured on 09/22/2016- followed by Dr Daws at [facility]    H/O hiatal hernia    History of kidney stones    HOH (hard of hearing)    Hyperlipidemia    Influenza A    Influenza B    symptoms started 05-16-2013/  dx 05-17-2013 by pcp   Left ankle injury    twisted left ankle    Left ureteral calculus    Pneumonia    hx of    Rash    right leg- calf -    Wears dentures    bottom    Patient Active Problem List   Diagnosis Date Noted   Acute bronchitis 05/17/2023   MCI (mild cognitive impairment) 02/01/2023   Well adult exam 09/09/2022   Impingement of left shoulder 07/20/2022   Lumbar spondylosis 04/09/2022   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   Dermatitis 10/10/2019   Erectile dysfunction 10/10/2019   Osteoarthritis 12/05/2018   Moderate asthma without complication 01/25/2018   Eczema of lower leg 08/16/2017   Pure hypercholesterolemia 04/05/2013   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   NOISE-INDUCED HEARING LOSS 12/17/2008   Dyshidrotic eczema 12/08/2007    NEPHROLITHIASIS, HX OF 11/10/2007   Allergic rhinitis 06/24/2007    Past Surgical History:  Procedure Laterality Date   ANTERIOR CERVICAL DECOMP/DISCECTOMY FUSION  01/27/2007   C6  --- C7   COLONOSCOPY  08/25/2016   TA   CYSTOSCOPY WITH URETEROSCOPY Left 05/26/2013   Procedure: CYSTOSCOPY WITH URETEROSCOPY, URETHERAL DILATATION,LEFT RETROGRADE, LASER LITHOTRIPSY, STENT PLACMENT, LEFT URETEROSCOPY;  Surgeon: Kathi Ludwig, MD;  Location: [facility];  Service: Urology;  Laterality: Left;   FINGER FRACTURE SURGERY  AS CHILD   GANGLION CYST EXCISION Left 2023   HOLMIUM LASER APPLICATION Left 05/26/2013   Procedure: HOLMIUM LASER APPLICATION;  Surgeon: Kathi Ludwig, MD;  Location[facility];  Service: Urology;  Laterality: Left;   IR RADIOLOGIST EVAL & MGMT  03/12/2023   JOINT REPLACEMENT  2922   LIPOMA EXCISION  05/17/2012   Procedure: EXCISION LIPOMA;  Surgeon: Atilano Ina, MD,FACS;  Location: [facility];  Service: General;  Laterality: Left;  incisional biopsy of left shoulder soft tissue mass   NASAL FRACTURE SURGERY  AS CHILD   ORIF ULNAR FRACTURE Right AS CHILD   ROTATOR CUFF REPAIR Right    right shoulder June2018 by [facility]  Orthopedic Georges Lynch Gioffre   TOTAL KNEE ARTHROPLASTY Right 12/05/2018   Procedure: LEFT ARTHROSCOPY, CHONDROPLASTY;  Surgeon: Ollen Gross, MD;  Location: [facility];  Service: Orthopedics;  Laterality: Right;    TOTAL KNEE REVISION Right 12/11/2020   Procedure: Right knee polyethylene revision;  Surgeon: Ollen Gross, MD;  Location: [facility];  Service: Orthopedics;  Laterality: Right;       Home Medications    Prior to Admission medications   Medication Sig Start Date End Date Taking? Authorizing Provider  amoxicillin-clavulanate (AUGMENTIN) 875-125 MG tablet Take 1 tablet by mouth every 12 (twelve) hours. 05/27/23  Yes Trevor Iha, FNP  HYDROcodone bit-homatropine (HYCODAN) 5-1.5  MG/5ML syrup Take 5 mLs by mouth every 6 (six) hours as needed for cough. 05/27/23  Yes Trevor Iha, FNP  predniSONE (STERAPRED UNI-PAK 21 TAB) 10 MG (21) TBPK tablet Take by mouth daily. Take 6 tabs by mouth daily  for 2 days, then 5 tabs for 2 days, then 4 tabs for 2 days, then 3 tabs for 2 days, 2 tabs for 2 days, then 1 tab by mouth daily for 2 days 05/27/23  Yes Trevor Iha, FNP  albuterol (VENTOLIN HFA) 108 (90 Base) MCG/ACT inhaler Inhale 2 puffs into the lungs every 6 (six) hours as needed for wheezing or shortness of breath. 05/17/23   Everrett Coombe, DO  Cyanocobalamin (VITAMIN B-12) 1000 MCG/15ML LIQD Take 1,000 mcg by mouth daily.    [provider]  diclofenac Sodium (VOLTAREN) 1 % GEL APPLY 4 G TOPICALLY 4 (FOUR) TIMES DAILY AS NEEDED.    [provider]  fluocinonide ointment (LIDEX) 0.05 % Apply topically 2 (two) times daily. 02/01/23   Everrett Coombe, DO  ketoconazole (NIZORAL) 2 % cream  11/18/22   [provider]  meloxicam (MOBIC) 15 MG tablet ONE TAB ORALLY EVERY 24 HOURS WITH A MEAL FOR 2 WEEKS, THEN ONCE EVERY 24 HOURS AS NEEDED FOR PAIN. 02/23/23   Everrett Coombe, DO  prednisoLONE acetate (PRED FORTE) 1 % ophthalmic suspension  04/13/23   [provider]    Family History Family History  Problem Relation Age of Onset   Arthritis Other    Hypertension Other    Osteoporosis Other    Heart disease Other    Lung disease Other    Prostate cancer Father    Hearing loss Father    Heart disease Sister    Asthma Mother    Colon cancer Neg Hx    Colon polyps Neg Hx    Esophageal cancer Neg Hx    Kidney disease Neg Hx    Rectal cancer Neg Hx    Stomach cancer Neg Hx     Social History Social History   Tobacco Use   Smoking status: Never    Passive exposure: Never   Smokeless tobacco: Never  Vaping Use   Vaping status: Never Used  Substance Use Topics   Alcohol use: Yes    Alcohol/week: 21.0 standard drinks of alcohol     Types: 21 Cans of beer per week    Comment: beer- 0-4 beers day sometimes   Drug use: Never     Allergies   Sesame oil and Tobramycin   Review of Systems Review of Systems  Respiratory:  Positive for cough.   All other systems reviewed and are negative.    Physical Exam Triage Vital Signs ED Triage Vitals  Encounter Vitals Group     BP      Systolic BP  Percentile      Diastolic BP Percentile      Pulse      Resp      Temp      Temp src      SpO2      Weight      Height      Head Circumference      Peak Flow      Pain Score      Pain Loc      Pain Education      Exclude from Growth Chart    No data found.  Updated Vital Signs BP (!) 142/79 (BP Location: Right Arm)   Pulse 80   Temp 98.3 F (36.8 C) (Oral)   Resp 17   SpO2 95%    Physical Exam Vitals and nursing note reviewed.  Constitutional:      Appearance: Normal appearance. He is normal weight.  HENT:     Head: Normocephalic and atraumatic.     Nose: Nose normal.     Mouth/Throat:     Mouth: Mucous membranes are moist.     Pharynx: Oropharynx is clear.  Eyes:     Extraocular Movements: Extraocular movements intact.     Conjunctiva/sclera: Conjunctivae normal.     Pupils: Pupils are equal, round, and reactive to light.  Cardiovascular:     Rate and Rhythm: Normal rate and regular rhythm.     Pulses: Normal pulses.     Heart sounds: Normal heart sounds.  Pulmonary:     Effort: Pulmonary effort is normal.     Breath sounds: Wheezing, rhonchi and rales present.     Comments: Diffuse scattered rhonchi, wheezing, fine rales and crackles noted over right upper/right middle; infrequent non-productive cough on exam Musculoskeletal:        General: Normal range of motion.     Cervical back: Normal range of motion and neck supple.  Skin:    General: Skin is warm and dry.  Neurological:     General: No focal deficit present.     Mental Status: He is alert and oriented to person, place, and time.  Mental status is at baseline.  Psychiatric:        Mood and Affect: Mood normal.        Behavior: Behavior normal.      UC Treatments / Results  Labs (all labs ordered are listed, but only abnormal results are displayed) Labs Reviewed - No data to display  EKG   Radiology DG Chest 2 View Result Date: 05/27/2023 CLINICAL DATA:  Cough for 10 days. Adventitious breath sounds. Concern for pneumonia. EXAM: CHEST - 2 VIEW COMPARISON:  Radiograph 05/17/2023 FINDINGS: Stable heart size and mediastinal contours. Unchanged left-sided calcified pleural plaques. No acute airspace disease. Mild bronchial thickening. No pulmonary edema, pleural effusion, or pneumothorax. Stable osseous structures. Mild upper thoracic mild compression deformity is unchanged over the last 10 days. IMPRESSION: Mild bronchial thickening without focal airspace disease. Electronically Signed   By: Narda Rutherford M.D.   On: 05/27/2023 15:57    Procedures Procedures (including critical care time)  Medications Ordered in UC Medications - No data to display  Initial Impression / Assessment and Plan / UC Course  I have reviewed the triage vital signs and the nursing notes.  Pertinent labs & imaging results that were available during my care of the patient were reviewed by me and considered in my medical decision making (see chart for details).     MDM:  1.  Cough, unspecified type-Rx'd Augmentin 875/125 mg tablet: Take 1 tablet twice daily x 7 days, CXR results revealed above, Rx'd Hycodan 5-1.5 mg / 5 mL syrup: Take 5 mL every 6 hours for cough, as needed; 2.  Bronchitis-Sterapred Unipak (tapering from 60 mg to 10 mg over 10 days); 3.  Adventitious breath sounds-CXR results revealed above. Advised patient to take medications as directed with food to completion.  Advised patient to take prednisone with first dose of Augmentin until complete.  Advised may use Hycodan cough syrup at night prior to sleep due to sedative  effects.  Encouraged to increase daily water intake to 64 ounces per day while taking these medications.  Advised if symptoms worsen and/or unresolved please follow-up with your PCP or here for further evaluation.  Patient discharged home, hemodynamically stable. Final Clinical Impressions(s) / UC Diagnoses   Final diagnoses:  Cough, unspecified type  Adventitious breath sounds  Bronchitis     Discharge Instructions      Advised patient to take medications as directed with food to completion.  Advised patient to take prednisone with first dose of Augmentin until complete.  Advised may use Hycodan cough syrup at night prior to sleep due to sedative effects.  Encouraged to increase daily water intake to 64 ounces per day while taking these medications.  Advised if symptoms worsen and/or unresolved please follow-up with your PCP or here for further evaluation.     ED Prescriptions     Medication Sig Dispense Auth. Provider   amoxicillin-clavulanate (AUGMENTIN) 875-125 MG tablet Take 1 tablet by mouth every 12 (twelve) hours. 14 tablet Trevor Iha, FNP   predniSONE (STERAPRED UNI-PAK 21 TAB) 10 MG (21) TBPK tablet Take by mouth daily. Take 6 tabs by mouth daily  for 2 days, then 5 tabs for 2 days, then 4 tabs for 2 days, then 3 tabs for 2 days, 2 tabs for 2 days, then 1 tab by mouth daily for 2 days 42 tablet Trevor Iha, FNP   HYDROcodone bit-homatropine (HYCODAN) 5-1.5 MG/5ML syrup Take 5 mLs by mouth every 6 (six) hours as needed for cough. 120 mL Trevor Iha, FNP      I have reviewed the PDMP during this encounter.   Trevor Iha, FNP 05/27/23 1615

## 2023-05-27 NOTE — Discharge Instructions (Addendum)
Advised patient to take medications as directed with food to completion.  Advised patient to take prednisone with first dose of Augmentin until complete.  Advised may use Hycodan cough syrup at night prior to sleep due to sedative effects.  Encouraged to increase daily water intake to 64 ounces per day while taking these medications.  Advised if symptoms worsen and/or unresolved please follow-up with your PCP or here for further evaluation.

## 2023-05-31 DIAGNOSIS — Z9842 Cataract extraction status, left eye: Secondary | ICD-10-CM | POA: Diagnosis not present

## 2023-05-31 DIAGNOSIS — Z961 Presence of intraocular lens: Secondary | ICD-10-CM | POA: Diagnosis not present

## 2023-05-31 DIAGNOSIS — H2512 Age-related nuclear cataract, left eye: Secondary | ICD-10-CM | POA: Diagnosis not present

## 2023-05-31 DIAGNOSIS — Z9841 Cataract extraction status, right eye: Secondary | ICD-10-CM | POA: Diagnosis not present

## 2023-06-11 ENCOUNTER — Ambulatory Visit
Admission: EM | Admit: 2023-06-11 | Discharge: 2023-06-11 | Disposition: A | Discharge status: Home / Self Care | Payer: PPO | Attending: Family Medicine | Admitting: Family Medicine

## 2023-06-11 ENCOUNTER — Other Ambulatory Visit: Payer: Self-pay

## 2023-06-11 DIAGNOSIS — J4540 Moderate persistent asthma, uncomplicated: Secondary | ICD-10-CM

## 2023-06-11 MED ORDER — BUDESONIDE-FORMOTEROL FUMARATE 80-4.5 MCG/ACT IN AERO: 2.0000 | INHALATION_SPRAY | Freq: Two times a day (BID) | RESPIRATORY_TRACT | 1 refills | Status: DC

## 2023-06-11 MED ORDER — DOXYCYCLINE HYCLATE 100 MG PO CAPS: ORAL_CAPSULE | ORAL | 0 refills | Status: DC

## 2023-06-11 MED ORDER — PREDNISONE 20 MG PO TABS: ORAL_TABLET | ORAL | 0 refills | Status: DC

## 2023-06-11 MED ORDER — GUAIFENESIN-CODEINE 100-10 MG/5ML PO SOLN: ORAL | 0 refills | Status: DC

## 2023-06-11 NOTE — ED Provider Notes (Signed)
[name]    CSN: [id number] Arrival date & time: 06/11/23  1225      History   Chief Complaint Chief Complaint  Patient presents with  . Cough    HPI [name] is a 73 y.o. male.   This 73 year old male with history of asthma was evaluated in this clinic two weeks ago with a complaint of worsening cough for 10 days.  His exam at that time revealed diffuse scattered rhonchi, wheezing, fine rales and crackles.  His chest x-ray at that time revealed mild bronchial thickening without focal airspace disease.  He was treated with Augmentin, a prednisone taper, and Hycodan syrup.      C/o lethargy, congestion, can't sleep, wheezing since before last visit. Was seen on 1/30. Took all meds prescribed at last visit. Has not noticed fever. Has not been taking otc meds.  The history is provided by the patient.    Past Medical History:  Diagnosis Date  . Allergy   . Anxiety   . Arthritis   . Asthma   . At risk for sleep apnea    STOP-BANG = 4  SENT TO PCP 05-24-2013  . Cataract 2023  . Eczema   . Edema of foot   . Finger wound, simple, open    left middle index finger - stitches and dressing occured on 09/22/2016- followed by Dr Daws at [facility]   . H/O hiatal hernia   . History of kidney stones   . HOH (hard of hearing)   . Hyperlipidemia   . Influenza A   . Influenza B    symptoms started 05-16-2013/  dx 05-17-2013 by pcp  . Left ankle injury    twisted left ankle   . Left ureteral calculus   . Pneumonia    hx of   . Rash    right leg- calf -   . Wears dentures    bottom    Patient Active Problem List   Diagnosis Date Noted  . Acute bronchitis 05/17/2023  . MCI (mild cognitive impairment) 02/01/2023  . Well adult exam 09/09/2022  . Impingement of left shoulder 07/20/2022  . Lumbar spondylosis 04/09/2022  . 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 Dermatitis 10/10/2019  . Erectile dysfunction 10/10/2019  . Osteoarthritis  12/05/2018  . Moderate asthma without complication 01/25/2018  . Eczema of lower leg 08/16/2017  . Pure hypercholesterolemia 04/05/2013  . 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 NOISE-INDUCED HEARING LOSS 12/17/2008  . Dyshidrotic eczema 12/08/2007  . NEPHROLITHIASIS, HX OF 11/10/2007  . Allergic rhinitis 06/24/2007    Past Surgical History:  Procedure Laterality Date  . ANTERIOR CERVICAL DECOMP/DISCECTOMY FUSION  01/27/2007   C6  --- C7  . COLONOSCOPY  08/25/2016   TA  . CYSTOSCOPY WITH URETEROSCOPY Left 05/26/2013   Procedure: CYSTOSCOPY WITH URETEROSCOPY, URETHERAL DILATATION,LEFT RETROGRADE, LASER LITHOTRIPSY, STENT PLACMENT, LEFT URETEROSCOPY;  Surgeon: Kathi Ludwig, MD;  Location: [facility];  Service: Urology;  Laterality: Left;  . FINGER FRACTURE SURGERY  AS CHILD  . GANGLION CYST EXCISION Left 2023  . HOLMIUM LASER APPLICATION Left 05/26/2013   Procedure: HOLMIUM LASER APPLICATION;  Surgeon: Kathi Ludwig, MD;  Location: [facility];  Service: Urology;  Laterality: Left;  . IR RADIOLOGIST EVAL & MGMT  03/12/2023  . JOINT REPLACEMENT  2922  . LIPOMA EXCISION  05/17/2012   Procedure: EXCISION LIPOMA;  Surgeon: Atilano Ina, MD,FACS;  Location: [facility];  Service: General;  Laterality: Left;  incisional biopsy of left shoulder soft tissue mass  . NASAL FRACTURE SURGERY  AS CHILD  . ORIF ULNAR FRACTURE Right AS CHILD  . ROTATOR CUFF REPAIR Right    right shoulder June2018 by [facility] Windy Fast A Gioffre  . TOTAL KNEE ARTHROPLASTY Right 12/05/2018   Procedure: LEFT ARTHROSCOPY, CHONDROPLASTY;  Surgeon: Ollen Gross, MD;  Location: [facility];  Service: Orthopedics;  Laterality: Right;   . TOTAL KNEE REVISION Right 12/11/2020   Procedure: Right knee polyethylene revision;  Surgeon: Ollen Gross, MD;  Location: [facility];  Service: Orthopedics;  Laterality: Right;       Home Medications     Prior to Admission medications   Medication Sig Start Date End Date Taking? Authorizing Provider  budesonide-formoterol (SYMBICORT) 80-4.5 MCG/ACT inhaler Inhale 2 puffs into the lungs 2 (two) times daily. 06/11/23  Yes Lattie Haw, MD  doxycycline (VIBRAMYCIN) 100 MG capsule Take one cap PO Q12hr with food. 06/11/23  Yes Lattie Haw, MD  guaiFENesin-codeine 100-10 MG/5ML syrup Take 5mL PO HS PRN cough 06/11/23  Yes Diedre Maclellan, Tera Mater, MD  predniSONE (DELTASONE) 20 MG tablet Take one tab by mouth twice daily for 4 days, then one daily. Take with food. 06/11/23  Yes Lattie Haw, MD  albuterol (VENTOLIN HFA) 108 (90 Base) MCG/ACT inhaler Inhale 2 puffs into the lungs every 6 (six) hours as needed for wheezing or shortness of breath. 05/17/23   Everrett Coombe, DO  Cyanocobalamin (VITAMIN B-12) 1000 MCG/15ML LIQD Take 1,000 mcg by mouth daily.    [provider]  diclofenac Sodium (VOLTAREN) 1 % GEL APPLY 4 G TOPICALLY 4 (FOUR) TIMES DAILY AS NEEDED.    [provider]  fluocinonide ointment (LIDEX) 0.05 % Apply topically 2 (two) times daily. 02/01/23   Everrett Coombe, DO  ketoconazole (NIZORAL) 2 % cream  11/18/22   [provider]  meloxicam (MOBIC) 15 MG tablet ONE TAB ORALLY EVERY 24 HOURS WITH A MEAL FOR 2 WEEKS, THEN ONCE EVERY 24 HOURS AS NEEDED FOR PAIN. 02/23/23   Everrett Coombe, DO  prednisoLONE acetate (PRED FORTE) 1 % ophthalmic suspension  04/13/23   [provider]    Family History Family History  Problem Relation Age of Onset  . Arthritis Other   . Hypertension Other   . Osteoporosis Other   . Heart disease Other   . Lung disease Other   . Prostate cancer Father   . Hearing loss Father   . Heart disease Sister   . Asthma Mother   . Colon cancer Neg Hx   . Colon polyps Neg Hx   . Esophageal cancer Neg Hx   . Kidney disease Neg Hx   . Rectal cancer Neg Hx   . Stomach cancer Neg Hx     Social History Social History   Tobacco  Use  . Smoking status: Never    Passive exposure: Never  . Smokeless tobacco: Never  Vaping Use  . Vaping status: Never Used  Substance Use Topics  . Alcohol use: Yes    Alcohol/week: 21.0 standard drinks of alcohol    Types: 21 Cans of beer per week    Comment: beer- 0-4 beers day sometimes  . Drug use: Never     Allergies   Sesame oil and Tobramycin   Review of Systems Review of Systems No sore throat + cough No pleuritic pain + wheezing No nasal  congestion ? post-nasal drainage No sinus pain/pressure No itchy/red eyes No earache No hemoptysis + SOB with activity No fever/chills No nausea No vomiting No abdominal pain No diarrhea No urinary symptoms No skin rash + fatigue No myalgias No headache   Physical Exam Triage Vital Signs ED Triage Vitals  Encounter Vitals Group     BP 06/11/23 1259 (!) 152/82     Systolic BP Percentile --      Diastolic BP Percentile --      Pulse Rate 06/11/23 1259 90     Resp 06/11/23 1259 (!) 22     Temp 06/11/23 1259 98.3 F (36.8 C)     Temp src --      SpO2 06/11/23 1259 95 %     Weight --      Height --      Head Circumference --      Peak Flow --      Pain Score 06/11/23 1304 0     Pain Loc --      Pain Education --      Exclude from Growth Chart --    No data found.  Updated Vital Signs BP (!) 152/82   Pulse 90   Temp 98.3 F (36.8 C)   Resp (!) 22   SpO2 95%   Visual Acuity Right Eye Distance:   Left Eye Distance:   Bilateral Distance:    Right Eye Near:   Left Eye Near:    Bilateral Near:     Physical Exam Nursing notes and Vital Signs reviewed. Appearance:  Patient appears stated age, and in no acute distress Eyes:  Pupils are equal, round, and reactive to light and accomodation.  Extraocular movement is intact.  Conjunctivae are not inflamed  Nose:  Mildly congested turbinates.  No sinus tenderness. Pharynx:  Normal Neck:  Supple. No adenopathy.   Lungs: Scattered rhonchi, and faint  wheezing anteriorly.  Breath sounds are equal. Heart:  Regular rate and rhythm without murmurs, rubs, or gallops.  Abdomen:  Nontender without masses or hepatosplenomegaly.  Bowel sounds are present.  No CVA or flank tenderness.  Extremities:  No edema.  Skin:  No rash present.   UC Treatments / Results  Labs (all labs ordered are listed, but only abnormal results are displayed) Labs Reviewed - No data to display  EKG   Radiology No results found.  Procedures Procedures (including critical care time)  Medications Ordered in UC Medications - No data to display  Initial Impression / Assessment and Plan / UC Course  I have reviewed the triage vital signs and the nursing notes.  Pertinent labs & imaging results that were available during my care of the patient were reviewed by me and considered in my medical decision making (see chart for details).    Begin doxycycline 100mg  Q12hr for one week, and prednisone burst/taper. Begin Symbicort.inhaler Continue albuterol inhaler Q4-6hr prn Rx for Robitussin AC HS PRN. Followup with PCP Recommend evaluation by pulomonologist.   Final Clinical Impressions(s) / UC Diagnoses   Final diagnoses:  Moderate persistent asthma without complication     Discharge Instructions      May continue albuterol inhaler as prescribed.  If symptoms become significantly worse during the night or over the weekend, proceed to the local emergency room.     ED Prescriptions     Medication Sig Dispense Auth. Provider   doxycycline (VIBRAMYCIN) 100 MG capsule Take one cap PO Q12hr with food. 14 capsule Donna Christen  A, MD   budesonide-formoterol (SYMBICORT) 80-4.5 MCG/ACT inhaler Inhale 2 puffs into the lungs 2 (two) times daily. 6.9 g Lattie Haw, MD   guaiFENesin-codeine 100-10 MG/5ML syrup Take 5mL PO HS PRN cough 25 mL Lattie Haw, MD   predniSONE (DELTASONE) 20 MG tablet Take one tab by mouth twice daily for 4 days, then one daily.  Take with food. 12 tablet Lattie Haw, MD

## 2023-06-11 NOTE — Discharge Instructions (Signed)
May continue albuterol inhaler as prescribed.  If symptoms become significantly worse during the night or over the weekend, proceed to the local emergency room.

## 2023-06-11 NOTE — ED Triage Notes (Signed)
C/o lethargy, congestion, can't sleep, wheezing since before last visit. Was seen on 1/30. Took all meds prescribed at last visit. Has not noticed fever. Has not been taking otc meds.

## 2023-06-14 ENCOUNTER — Ambulatory Visit (INDEPENDENT_AMBULATORY_CARE_PROVIDER_SITE_OTHER): Payer: PPO | Admitting: Family Medicine

## 2023-06-14 ENCOUNTER — Encounter: Payer: Self-pay | Admitting: Family Medicine

## 2023-06-14 VITALS — BP 116/71 | HR 71 | Ht 71.0 in | Wt 181.0 lb

## 2023-06-14 DIAGNOSIS — R052 Subacute cough: Secondary | ICD-10-CM | POA: Diagnosis not present

## 2023-06-14 DIAGNOSIS — U071 COVID-19: Secondary | ICD-10-CM

## 2023-06-14 DIAGNOSIS — R0601 Orthopnea: Secondary | ICD-10-CM

## 2023-06-14 MED ORDER — GUAIFENESIN-CODEINE 100-10 MG/5ML PO SOLN: ORAL | 0 refills | Status: DC

## 2023-06-14 NOTE — Patient Instructions (Signed)
Continue symbicort.  Albuterol every 4-6 hours as needed.  Cough syrup as needed.  Referral to pulmonology needed.

## 2023-06-15 DIAGNOSIS — R059 Cough, unspecified: Secondary | ICD-10-CM | POA: Insufficient documentation

## 2023-06-15 NOTE — Assessment & Plan Note (Signed)
He has had persistent cough for nearly 2 months.  Possibly some improvement with addition of Symbicort and guaifenesin.  He does have some orthopnea but no other symptoms related to CHF.  No signs of fluid overload or crackles noted on exam.  Robitussin AC renewed.  Referral placed to pulmonology and will order CT angio to rule out pulmonary embolism given that this started right after he had COVID.

## 2023-06-15 NOTE — Progress Notes (Signed)
[name] - 73 y.o. male MRN [id number]  Date of birth: Jan 23, 1951  Subjective Chief Complaint  Patient presents with   URI    HPI [name] is a 73 year old male here today for follow-up of cough.  He has had cough with some dyspnea since having COVID in late December.  He has been seen in our clinic as well as urgent care x 2.  Has been on antibiotics as well as steroids.  Symbicort was recently added.  This in combination with will Fenesin may seem to be helping.  He did have some wheezing at one point but this seems to be improved.  He denies chest pain,, fever or chills.  His shortness of breath does worsen when trying to lay flat.  He denies any leg swelling.  ROS:  A comprehensive ROS was completed and negative except as noted per HPI  Allergies  Allergen Reactions   Sesame Oil Itching and Swelling   Tobramycin Itching and Swelling    Used in an eye oint-reaction    Past Medical History:  Diagnosis Date   Allergy    Anxiety    Arthritis    Asthma    At risk for sleep apnea    STOP-BANG = 4  SENT TO PCP 05-24-2013   Cataract 2023   Eczema    Edema of foot    Finger wound, simple, open    left middle index finger - stitches and dressing occured on 09/22/2016- followed by Dr Daws at [facility]    H/O hiatal hernia    History of kidney stones    HOH (hard of hearing)    Hyperlipidemia    Influenza A    Influenza B    symptoms started 05-16-2013/  dx 05-17-2013 by pcp   Left ankle injury    twisted left ankle    Left ureteral calculus    Pneumonia    hx of    Rash    right leg- calf -    Wears dentures    bottom    Past Surgical History:  Procedure Laterality Date   ANTERIOR CERVICAL DECOMP/DISCECTOMY FUSION  01/27/2007   C6  --- C7   COLONOSCOPY  08/25/2016   TA   CYSTOSCOPY WITH URETEROSCOPY Left 05/26/2013   Procedure: CYSTOSCOPY WITH URETEROSCOPY, URETHERAL DILATATION,LEFT RETROGRADE, LASER LITHOTRIPSY, STENT PLACMENT, LEFT URETEROSCOPY;   Surgeon: Kathi Ludwig, MD;  Location: [facility];  Service: Urology;  Laterality: Left;   FINGER FRACTURE SURGERY  AS CHILD   GANGLION CYST EXCISION Left 2023   HOLMIUM LASER APPLICATION Left 05/26/2013   Procedure: HOLMIUM LASER APPLICATION;  Surgeon: Kathi Ludwig, MD;  Location: [facility];  Service: Urology;  Laterality: Left;   IR RADIOLOGIST EVAL & MGMT  03/12/2023   JOINT REPLACEMENT  2922   LIPOMA EXCISION  05/17/2012   Procedure: EXCISION LIPOMA;  Surgeon: Atilano Ina, MD,FACS;  Location:  SUR[facility]ERY [facility];  Service: General;  Laterality: Left;  incisional biopsy of left shoulder soft tissue mass   NASAL FRACTURE SURGERY  AS CHILD   ORIF ULNAR FRACTURE Right AS CHILD   ROTATOR CUFF REPAIR Right    right shoulder June2018 by [facility] A Gio[facility]re   TOTAL KNEE ARTHROPLASTY Right 12/05/2018   Procedure: LEFT ARTHROSCOPY, CHONDROPLASTY;  Surgeon: Ollen Gross, MD;  Location: [facility];  Service: Orthopedics;  Laterality: Right;    TOTAL KNEE REVISION Right 12/11/2020   Procedure:  Right knee polyethylene revision;  Surgeon: Ollen Gross, MD;  Location: [facility];  Service: Orthopedics;  Laterality: Right;    Social History   Socioeconomic History   Marital status: Married    Spouse name: Stiles Maxcy   Number of children: 2   Years of education: 13   Highest education level: Some college, no degree  Occupational History   Occupation: Nurse, learning disability   Occupation: Retired  Tobacco Use   Smoking status: Never    Passive exposure: Never   Smokeless tobacco: Never  Vaping Use   Vaping status: Never Used  Substance and Sexual Activity   Alcohol use: Yes    Alcohol/week: 21.0 standard drinks of alcohol    Types: 21 Cans of beer per week    Comment: beer- 0-4 beers day sometimes   Drug use: Never   Sexual activity: Not Currently    Partners: Female  Other Topics Concern   Not on file   Social History Narrative   Lives with his wife. He has two children. He enjoys wood working, Engineer, water.   Social Drivers of Corporate investment banker Strain: Low Risk  (12/01/2022)   Overall Financial Resource Strain (CARDIA)    Difficulty of Paying Living Expenses: Not hard at all  Food Insecurity: No Food Insecurity (12/01/2022)   Hunger Vital Sign    Worried About Running Out of Food in the Last Year: Never true    Ran Out of Food in the Last Year: Never true  Transportation Needs: No Transportation Needs (12/01/2022)   PRAPARE - Administrator, Civil Service (Medical): No    Lack of Transportation (Non-Medical): No  Physical Activity: Insufficiently Active (12/01/2022)   Exercise Vital Sign    Days of Exercise per Week: 4 days    Minutes of Exercise per Session: 20 min  Stress: No Stress Concern Present (12/01/2022)   [employer] of Occupational Health - Occupational Stress Questionnaire    Feeling of Stress : Not at all  Social Connections: Moderately Integrated (12/01/2022)   Social Connection and Isolation Panel [NHANES]    Frequency of Communication with Friends and Family: More than three times a week    Frequency of Social Gatherings with Friends and Family: Twice a week    Attends Religious Services: More than 4 times per year    Active Member of [employer] or Organizations: No    Attends Banker Meetings: Never    Marital Status: Married    Family History  Problem Relation Age of Onset   Arthritis Other    Hypertension Other    Osteoporosis Other    Heart disease Other    Lung disease Other    Prostate cancer Father    Hearing loss Father    Heart disease Sister    Asthma Mother    Colon cancer Neg Hx    Colon polyps Neg Hx    Esophageal cancer Neg Hx    Kidney disease Neg Hx    Rectal cancer Neg Hx    Stomach cancer Neg Hx     Health Maintenance  Topic Date Due   Zoster Vaccines- Shingrix (1 of 2) Never done   COVID-19  Vaccine (5 - 2024-25 season) 12/27/2022   Medicare Annual Wellness (AWV)  12/01/2023   DTaP/Tdap/Td (5 - Td or Tdap) 09/23/2026   Pneumonia Vaccine 62+ Years old  Completed   INFLUENZA VACCINE  Completed   Hepatitis C Screening  Completed  HPV VACCINES  Aged Out   Colonoscopy  Discontinued     ----------------------------------------------------------------------------------------------------------------------------------------------------------------------------------------------------------------- Physical Exam BP 116/71 (BP Location: Left Arm, Patient Position: Sitting, Cuff Size: Normal)   Pulse 71   Ht 5\' 11"  (1.803 m)   Wt 181 lb (82.1 kg)   SpO2 97%   BMI 25.24 kg/m   Physical Exam Constitutional:      Appearance: Normal appearance.  HENT:     Head: Normocephalic and atraumatic.  Eyes:     General: No scleral icterus. Cardiovascular:     Rate and Rhythm: Normal rate and regular rhythm.     Heart sounds: No murmur heard. Pulmonary:     Effort: Pulmonary effort is normal.     Breath sounds: Normal breath sounds.  Musculoskeletal:     Cervical back: Neck supple.  Neurological:     Mental Status: He is alert.  Psychiatric:        Mood and Affect: Mood normal.        Behavior: Behavior normal.     ------------------------------------------------------------------------------------------------------------------------------------------------------------------------------------------------------------------- Assessment and Plan  Cough He has had persistent cough for nearly 2 months.  Possibly some improvement with addition of Symbicort and guaifenesin.  He does have some orthopnea but no other symptoms related to CHF.  No signs of fluid overload or crackles noted on exam.  Robitussin AC renewed.  Referral placed to pulmonology and will order CT angio to rule out pulmonary embolism given that this started right after he had COVID.   Meds ordered this encounter   Medications   guaiFENesin-codeine 100-10 MG/5ML syrup    Sig: Take 5mL PO HS PRN cough    Dispense:  125 mL    Refill:  0    No follow-ups on file.    This visit occurred during the SARS-CoV-2 public health emergency.  Safety protocols were in place, including screening questions prior to the visit, additional usage of staff PPE, and extensive cleaning of exam room while observing appropriate contact time as indicated for disinfecting solutions.

## 2023-06-18 ENCOUNTER — Ambulatory Visit (HOSPITAL_BASED_OUTPATIENT_CLINIC_OR_DEPARTMENT_OTHER)
Admission: RE | Admit: 2023-06-18 | Discharge: 2023-06-18 | Disposition: A | Discharge status: Home / Self Care | Payer: PPO | Source: Ambulatory Visit | Attending: Family Medicine | Admitting: Family Medicine

## 2023-06-18 ENCOUNTER — Encounter: Payer: Self-pay | Admitting: Family Medicine

## 2023-06-18 DIAGNOSIS — R0601 Orthopnea: Secondary | ICD-10-CM | POA: Insufficient documentation

## 2023-06-18 DIAGNOSIS — R059 Cough, unspecified: Secondary | ICD-10-CM | POA: Diagnosis not present

## 2023-06-18 DIAGNOSIS — U071 COVID-19: Secondary | ICD-10-CM | POA: Diagnosis not present

## 2023-06-18 DIAGNOSIS — J929 Pleural plaque without asbestos: Secondary | ICD-10-CM | POA: Diagnosis not present

## 2023-06-18 DIAGNOSIS — R052 Subacute cough: Secondary | ICD-10-CM | POA: Diagnosis not present

## 2023-06-18 DIAGNOSIS — R0602 Shortness of breath: Secondary | ICD-10-CM | POA: Diagnosis not present

## 2023-06-18 MED ORDER — IOHEXOL 350 MG/ML SOLN
80.0000 mL | Freq: Once | INTRAVENOUS | Status: AC | PRN
  Administered 2023-06-18: 80 mL via INTRAVENOUS

## 2023-06-30 ENCOUNTER — Encounter: Payer: Self-pay | Admitting: Psychology

## 2023-07-12 ENCOUNTER — Encounter: Payer: Self-pay | Admitting: Student in an Organized Health Care Education/Training Program

## 2023-07-12 ENCOUNTER — Ambulatory Visit: Admitting: Student in an Organized Health Care Education/Training Program

## 2023-07-12 ENCOUNTER — Other Ambulatory Visit: Payer: Self-pay | Admitting: Student in an Organized Health Care Education/Training Program

## 2023-07-12 VITALS — BP 124/78 | HR 100 | Temp 97.1°F | Ht 71.0 in | Wt 228.2 lb

## 2023-07-12 DIAGNOSIS — R0602 Shortness of breath: Secondary | ICD-10-CM

## 2023-07-12 DIAGNOSIS — J4540 Moderate persistent asthma, uncomplicated: Secondary | ICD-10-CM

## 2023-07-12 LAB — NITRIC OXIDE: Nitric Oxide: 80

## 2023-07-12 MED ORDER — BUDESONIDE-FORMOTEROL FUMARATE 160-4.5 MCG/ACT IN AERO: 2.0000 | INHALATION_SPRAY | Freq: Two times a day (BID) | RESPIRATORY_TRACT | 12 refills | Status: DC

## 2023-07-12 MED ORDER — AEROCHAMBER MV MISC: 0 refills | Status: DC

## 2023-07-12 NOTE — Telephone Encounter (Signed)
 I have notified the patient. Nothing further needed.

## 2023-07-12 NOTE — Progress Notes (Unsigned)
 Synopsis: Referred in *** by Everrett Coombe, DO  Assessment & Plan:   1. SOB (shortness of breath) *** - Nitric oxide - budesonide-formoterol (SYMBICORT) 160-4.5 MCG/ACT inhaler; Inhale 2 puffs into the lungs in the morning and at bedtime.  Dispense: 1 each; Refill: 12 - Spacer/Aero-Holding Chambers (AEROCHAMBER MV) inhaler; Use as instructed  Dispense: 1 each; Refill: 0 - Pulmonary Function Test [facility] Only; Future  2. Moderate persistent asthma without complication (Primary) *** - budesonide-formoterol (SYMBICORT) 160-4.5 MCG/ACT inhaler; Inhale 2 puffs into the lungs in the morning and at bedtime.  Dispense: 1 each; Refill: 12 - Spacer/Aero-Holding Chambers (AEROCHAMBER MV) inhaler; Use as instructed  Dispense: 1 each; Refill: 0 - Pulmonary Function Test [facility] Only; Future  COVID in December, increase SOB/cough/wheeze, seen few times in urgent care, few prednisone, brief improvement. Started low dose symbicort, Imprioved but not fully normal. Clear lungs on asuc but end expiratory wheeze noted. FENO 80. No personal history of asthma but reports previous episodes of bronchitis requiring prednisone in the past. Mom had bad asthma. 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 No smoking, no pets, no change to house. Will get PFT's, increase ICS dose to 160, add spacer device, follow up in3 months.  Return in about 3 months (around 10/12/2023).  I spent *** minutes caring for this patient today, including {EM billing:[id number]}  Raechel Chute, MD [facility] 07/12/2023 2:50 PM    End of visit medications:  Meds ordered this encounter  Medications   budesonide-formoterol (SYMBICORT) 160-4.5 MCG/ACT inhaler    Sig: Inhale 2 puffs into the lungs in the morning and at bedtime.    Dispense:  1 each    Refill:  12   Spacer/Aero-Holding Chambers (AEROCHAMBER MV) inhaler    Sig: Use as instructed    Dispense:  1 each    Refill:  0     Current Outpatient  Medications:    albuterol (VENTOLIN HFA) 108 (90 Base) MCG/ACT inhaler, Inhale 2 puffs into the lungs every 6 (six) hours as needed for wheezing or shortness of breath., Disp: 8.5 each, Rfl: 1   budesonide-formoterol (SYMBICORT) 160-4.5 MCG/ACT inhaler, Inhale 2 puffs into the lungs in the morning and at bedtime., Disp: 1 each, Rfl: 12   Cyanocobalamin (VITAMIN B-12) 1000 MCG/15ML LIQD, Take 1,000 mcg by mouth daily., Disp: , Rfl:    meloxicam (MOBIC) 15 MG tablet, ONE TAB ORALLY EVERY 24 HOURS WITH A MEAL FOR 2 WEEKS, THEN ONCE EVERY 24 HOURS AS NEEDED FOR PAIN., Disp: 30 tablet, Rfl: 3   prednisoLONE acetate (PRED FORTE) 1 % ophthalmic suspension, , Disp: , Rfl:    Spacer/Aero-Holding Chambers (AEROCHAMBER MV) inhaler, Use as instructed, Disp: 1 each, Rfl: 0   doxycycline (VIBRAMYCIN) 100 MG capsule, Take one cap PO Q12hr with food. (Patient not taking: Reported on 07/12/2023), Disp: 14 capsule, Rfl: 0   guaiFENesin-codeine 100-10 MG/5ML syrup, Take 5mL PO HS PRN cough (Patient not taking: Reported on 07/12/2023), Disp: 125 mL, Rfl: 0   predniSONE (DELTASONE) 20 MG tablet, Take one tab by mouth twice daily for 4 days, then one daily. Take with food. (Patient not taking: Reported on 07/12/2023), Disp: 12 tablet, Rfl: 0   Subjective:   PATIENT ID: [name] GENDER: male DOB: [birth year], MRN: [id number]  Chief Complaint  Patient presents with   Consult    Cough with white. No SOB or wheezing.     HPI ***  Ancillary information including prior medications, full medical/surgical/family/social histories,  and PFTs (when available) are listed below and have been reviewed.   ROS   Objective:   Vitals:   07/12/23 1418  BP: 124/78  Pulse: 100  Temp: (!) 97.1 F (36.2 C)  SpO2: 97%  Weight: 228 lb 3.2 oz (103.5 kg)  Height: 5\' 11"  (1.803 m)   97% on *** LPM *** RA BMI Readings from Last 3 Encounters:  07/12/23 31.83 kg/m  06/14/23 25.24 kg/m  05/17/23 31.52 kg/m   Wt Readings  from Last 3 Encounters:  07/12/23 228 lb 3.2 oz (103.5 kg)  06/14/23 181 lb (82.1 kg)  05/17/23 226 lb (102.5 kg)    Physical Exam    Ancillary Information    Past Medical History:  Diagnosis Date   Allergy    Anxiety    Arthritis    Asthma    At risk for sleep apnea    STOP-BANG = 4  SENT TO PCP 05-24-2013   Cataract 2023   Eczema    Edema of foot    Finger wound, simple, open    left middle index finger - stitches and dressing occured on 09/22/2016- followed by Dr Daws at [facility]    H/O hiatal hernia    History of kidney stones    HOH (hard of hearing)    Hyperlipidemia    Influenza A    Influenza B    symptoms started 05-16-2013/  dx 05-17-2013 by pcp   Left ankle injury    twisted left ankle    Left ureteral calculus    Pneumonia    hx of    Rash    right leg- calf -    Wears dentures    bottom     Family History  Problem Relation Age of Onset   Arthritis Other    Hypertension Other    Osteoporosis Other    Heart disease Other    Lung disease Other    Prostate cancer Father    Hearing loss Father    Heart disease Sister    Asthma Mother    Colon cancer Neg Hx    Colon polyps Neg Hx    Esophageal cancer Neg Hx    Kidney disease Neg Hx    Rectal cancer Neg Hx    Stomach cancer Neg Hx      Past Surgical History:  Procedure Laterality Date   ANTERIOR CERVICAL DECOMP/DISCECTOMY FUSION  01/27/2007   C6  --- C7   COLONOSCOPY  08/25/2016   TA   CYSTOSCOPY WITH URETEROSCOPY Left 05/26/2013   Procedure: CYSTOSCOPY WITH URETEROSCOPY, URETHERAL DILATATION,LEFT RETROGRADE, LASER LITHOTRIPSY, STENT PLACMENT, LEFT URETEROSCOPY;  Surgeon: Kathi Ludwig, MD;  Location: [facility];  Service: Urology;  Laterality: Left;   FINGER FRACTURE SURGERY  AS CHILD   GANGLION CYST EXCISION Left 2023   HOLMIUM LASER APPLICATION Left 05/26/2013   Procedure: HOLMIUM LASER APPLICATION;  Surgeon: Kathi Ludwig, MD;  Location: [facility];  Service: Urology;  Laterality: Left;   IR RADIOLOGIST EVAL & MGMT  03/12/2023   JOINT REPLACEMENT  2922   LIPOMA EXCISION  05/17/2012   Procedure: EXCISION LIPOMA;  Surgeon: Atilano Ina, MD,FACS;  Location: [facility];  Service: General;  Laterality: Left;  incisional biopsy of left shoulder soft tissue mass   NASAL FRACTURE SURGERY  AS CHILD   ORIF ULNAR FRACTURE Right AS CHILD   ROTATOR CUFF REPAIR Right    right shoulder [id number] by  [facility] Windy Fast A Gioffre   TOTAL KNEE ARTHROPLASTY Right 12/05/2018   Procedure: LEFT ARTHROSCOPY, CHONDROPLASTY;  Surgeon: Ollen Gross, MD;  Location: [facility];  Service: Orthopedics;  Laterality: Right;    TOTAL KNEE REVISION Right 12/11/2020   Procedure: Right knee polyethylene revision;  Surgeon: Ollen Gross, MD;  Location: [facility];  Service: Orthopedics;  Laterality: Right;    Social History   Socioeconomic History   Marital status: Married    Spouse name: Donyell Ding   Number of children: 2   Years of education: 13   Highest education level: Some college, no degree  Occupational History   Occupation: Nurse, learning disability   Occupation: Retired  Tobacco Use   Smoking status: Never    Passive exposure: Never   Smokeless tobacco: Never  Vaping Use   Vaping status: Never Used  Substance and Sexual Activity   Alcohol use: Yes    Alcohol/week: 21.0 standard drinks of alcohol    Types: 21 Cans of beer per week    Comment: beer- 0-4 beers day sometimes   Drug use: Never   Sexual activity: Not Currently    Partners: Female  Other Topics Concern   Not on file  Social History Narrative   Lives with his wife. He has two children. He enjoys wood working, Engineer, water.   Social Drivers of Corporate investment banker Strain: Low Risk  (12/01/2022)   Overall Financial Resource Strain (CARDIA)    Difficulty of Paying Living Expenses: Not hard at all  Food Insecurity: No Food  Insecurity (12/01/2022)   Hunger Vital Sign    Worried About Running Out of Food in the Last Year: Never true    Ran Out of Food in the Last Year: Never true  Transportation Needs: No Transportation Needs (12/01/2022)   PRAPARE - Administrator, Civil Service (Medical): No    Lack of Transportation (Non-Medical): No  Physical Activity: Insufficiently Active (12/01/2022)   Exercise Vital Sign    Days of Exercise per Week: 4 days    Minutes of Exercise per Session: 20 min  Stress: No Stress Concern Present (12/01/2022)   [employer] of Occupational Health - Occupational Stress Questionnaire    Feeling of Stress : Not at all  Social Connections: Moderately Integrated (12/01/2022)   Social Connection and Isolation Panel [NHANES]    Frequency of Communication with Friends and Family: More than three times a week    Frequency of Social Gatherings with Friends and Family: Twice a week    Attends Religious Services: More than 4 times per year    Active Member of [employer] or Organizations: No    Attends Banker Meetings: Never    Marital Status: Married  Catering manager Violence: Not At Risk (04/19/2023)   Received from [facility]   HITS    Over the last 12 months how often did your partner physically hurt you?: Never    Over the last 12 months how often did your partner insult you or talk down to you?: Never    Over the last 12 months how often did your partner threaten you with physical harm?: Never    Over the last 12 months how often did your partner scream or curse at you?: Never     Allergies  Allergen Reactions   Sesame Oil Itching and Swelling   Tobramycin Itching and Swelling    Used in an eye oint-reaction  CBC    Component Value Date/Time   WBC 7.8 09/17/2022 1136   RBC 4.77 09/17/2022 1136   HGB 15.7 09/17/2022 1136   HCT 45.7 09/17/2022 1136   PLT 193 09/17/2022 1136   MCV 95.8 09/17/2022 1136   MCH 32.9 09/17/2022 1136   MCHC 34.4  09/17/2022 1136   RDW 11.8 09/17/2022 1136   LYMPHSABS 1,552 09/17/2022 1136   MONOABS 0.8 03/15/2017 1414   EOSABS 211 09/17/2022 1136   BASOSABS 70 09/17/2022 1136    Pulmonary Functions Testing Results:     No data to display          Outpatient Medications Prior to Visit  Medication Sig Dispense Refill   albuterol (VENTOLIN HFA) 108 (90 Base) MCG/ACT inhaler Inhale 2 puffs into the lungs every 6 (six) hours as needed for wheezing or shortness of breath. 8.5 each 1   Cyanocobalamin (VITAMIN B-12) 1000 MCG/15ML LIQD Take 1,000 mcg by mouth daily.     meloxicam (MOBIC) 15 MG tablet ONE TAB ORALLY EVERY 24 HOURS WITH A MEAL FOR 2 WEEKS, THEN ONCE EVERY 24 HOURS AS NEEDED FOR PAIN. 30 tablet 3   prednisoLONE acetate (PRED FORTE) 1 % ophthalmic suspension      budesonide-formoterol (SYMBICORT) 80-4.5 MCG/ACT inhaler Inhale 2 puffs into the lungs 2 (two) times daily. 6.9 g 1   doxycycline (VIBRAMYCIN) 100 MG capsule Take one cap PO Q12hr with food. (Patient not taking: Reported on 07/12/2023) 14 capsule 0   guaiFENesin-codeine 100-10 MG/5ML syrup Take 5mL PO HS PRN cough (Patient not taking: Reported on 07/12/2023) 125 mL 0   predniSONE (DELTASONE) 20 MG tablet Take one tab by mouth twice daily for 4 days, then one daily. Take with food. (Patient not taking: Reported on 07/12/2023) 12 tablet 0   No facility-administered medications prior to visit.

## 2023-08-09 ENCOUNTER — Encounter: Attending: Psychology | Admitting: Psychology

## 2023-08-09 DIAGNOSIS — R413 Other amnesia: Secondary | ICD-10-CM

## 2023-08-09 NOTE — Progress Notes (Unsigned)
 NEUROPSYCHOLOGICAL EVALUATION [place]  Physical Medicine and Rehabilitation     Patient: [name]  MRN: [id number] DOB: 12/09/1950  Age: 73 y.o. Sex: male  Race/Ethnicity: White or Caucasian *** Years of Education: *** Handedness: ***  Collateral Information Source: ***  Referring Provider: ***  Provider/Clinical Neuropsychologist: Loletta Ripple, PsyD  Date of Service: *** Start Time: *** End Time: ***  Location of Service:  [facility] Physical Medicine & Rehabilitation Department [place]. [facility] [address], [place] [place], [place] [address] Phone: [phone]  Billing Code/Service:            96116/96121  Individuals Present: Patient was unaccompanied and visit was conducted by provider in provider's office. 1 hour and 15 minutes spent in face-to-face clinical interview and remaining 45 minutes was spent in record review, documentation, and testing protocol construction.    PATIENT CONSENT AND CONFIDENTIALITY The patient's understanding of the reason for referral was intact. Discussed limits of confidentiality including, but not limited to, posting of final evaluation report in the patient's electronic medical record for both the patient and for the referring provider and appropriate medical professionals. Patient was given the opportunity to have their questions answered. The neuropsychological evaluation process was discussed with the patient and they consented to proceed with the evaluation.  Consent for Evaluation and Treatment: Signed: Yes Explanation of Privacy Policies: Signed: Yes Discussion of Confidentiality Limits: Yes  REASON FOR REFERRAL The patient was referred for neuropsychological evaluation by *** due to concerns of *** within the context of ***. Patient records from the referring provider dated *** indicate the patient endorsed *** which began ***.   Symptoms are reported to impact ***.  The  patient completed cognitive screening measures (MoCA) on *** and scored ***. Records also noted ***. Given concerns for ***, the patient was referred for neuropsychological evaluation and ***.  During the clinical interview for the current evaluation, the patient expressed that ***  HISTORY OF PRESENTING CONCERNS Cognitive Symptom Onset & Course: Upon interview, the patient reported *** onset of cognitive symptoms that occurred around ***. Symptoms have been *** since then.   Symptoms are exacerbated by ***. Symptoms are reduced by ***.  Notable events occurring around time of symptom onset included ***.    Current Cognitive Complaints:  Memory: *** difficulties with short term memory. The   Processing Speed: *** subjective changes in processing speed.   Attention & Concentration: *** difficulties with basic attention (e.g., focus with one on one conversation). *** distractibility, *** train of thought.   Language: *** difficulties with word-finding. *** other expressive language difficulties. *** receptive language.   Visual-Spatial: ***   Executive Functioning: ***   Motor/Sensory Complaints: ***  (Tremor, balance, walking/gait, coordination.  Smell, taste, vision, hearing)  Emotional and Behavioral Functioning: *** Psychiatric History: *** (Hx Sx + Tx;) Current Emotional/Behavioral Functioning: *** ***(Current Sx Dep, Anx, onset/course, PTSD, SI/HI, Mania, Psychosis, Hallucination, etc) ***(Hospitalization) ***(Treatment) ***(Significant stressors. Current stress)   Sleep: *** difficulties with sleep onset. *** maintenance. *** hours per night. *** hours to feel rested.  Appetite: *** in appetite. *** unintentional weight loss.  Caffeine: ***  Alcohol Use: *** Tobacco Use: *** Recreational Substance Use: ***   Level of Functional Independence: The patient is *** with basic activities of daily living.  Finances: ***   Shopping / Meal Preparation: Household  Maintenance / Chores: Firefighter / Future Obligations: Medication Management: ***   Driving: ***  Medical History/Record Review: Per records ***  ; cancer, cardiovascular, cerebrovascular, seizure, TBI, chronic pain, sleep apnea,   Past Medical History:  Diagnosis Date   Allergy    Anxiety    Arthritis    Asthma    At risk for sleep apnea    STOP-BANG = 4  SENT TO PCP 05-24-2013   Cataract 2023   Eczema    Edema of foot    Finger wound, simple, open    left middle index finger - stitches and dressing occured on 09/22/2016- followed by Dr Daws at [facility]    H/O hiatal hernia    History of kidney stones    HOH (hard of hearing)    Hyperlipidemia    Influenza A    Influenza B    symptoms started 05-16-2013/  dx 05-17-2013 by pcp   Left ankle injury    twisted left ankle    Left ureteral calculus    Pneumonia    hx of    Rash    right leg- calf -    Wears dentures    bottom    Patient Active Problem List   Diagnosis Date Noted   Cough 06/15/2023   Acute bronchitis 05/17/2023   MCI (mild cognitive impairment) 02/01/2023   Well adult exam 09/09/2022   Impingement of left shoulder 07/20/2022   Lumbar spondylosis 04/09/2022   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   Dermatitis 10/10/2019   Erectile dysfunction 10/10/2019   Osteoarthritis 12/05/2018   Moderate asthma without complication 01/25/2018   Eczema of lower leg 08/16/2017   Pure hypercholesterolemia 04/05/2013   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   NOISE-INDUCED HEARING LOSS 12/17/2008   Dyshidrotic eczema 12/08/2007   NEPHROLITHIASIS, HX OF 11/10/2007   Allergic rhinitis 06/24/2007    Imaging/Lab Results: ***  Family Neurologic/Medical Hx: *** Family History  Problem Relation Age of Onset   Arthritis Other    Hypertension Other    Osteoporosis Other    Heart disease Other    Lung disease Other    Prostate cancer Father    Hearing loss Father     Heart disease Sister    Asthma Mother    Colon cancer Neg Hx    Colon polyps Neg Hx    Esophageal cancer Neg Hx    Kidney disease Neg Hx    Rectal cancer Neg Hx    Stomach cancer Neg Hx        Medications:    Educational/Vocational History: ***   Psychosocial: Marital Status: *** Children/Grandchildren: *** Living Situation: *** Daily Activities/Hobbies: ***  Behavioral Observations:Behavioral Observations: The patient was seen on an outpatient basis in the [facility] PM&R office for the clinical interview *** Orientation: *** Ability to Participate in Interview: *** Attitude: *** Attention: *** Conversational Language: *** Thought Process/Content: *** Motor Problems: *** Affect/Mood: *** Insight: *** Behavior/Interactions: ***  SUMMARY / CLINICAL IMPRESSIONS  ***  DISPOSITION / PLAN  The patient has been set up for a formal neuropsychological assessment to objectively assess her cognitive functioning across domains to establish the patient's cognitive profile. This data, in conjunction with information obtained via clinical interview and medical record review, will help clarify likely etiology and guide treatment recommendations. Once data collection and interpretation have been completed, the findings / diagnosis and recommendations will be reviewed and discussed with the patient during a feedback appointment with the neuropsychologist. Based on the collaborative dialogue with the patient during the feedback, recommendations may be  adjusted / tailored as needed. A formal report will be produced and provided to the patient and the referring provider.   Diagnosis:     This report was generated using voice recognition software. While this document has been carefully reviewed, transcription errors may be present. I apologize in advance for any inconvenience. Please contact me if further clarification is needed.             Loletta Ripple, PsyD              Neuropsychologist

## 2023-08-11 ENCOUNTER — Encounter: Payer: Self-pay | Admitting: Psychology

## 2023-08-12 ENCOUNTER — Encounter

## 2023-08-16 ENCOUNTER — Encounter: Admitting: Psychology

## 2023-08-16 ENCOUNTER — Telehealth: Payer: Self-pay | Admitting: Family Medicine

## 2023-08-16 DIAGNOSIS — R5383 Other fatigue: Secondary | ICD-10-CM

## 2023-08-16 DIAGNOSIS — G3184 Mild cognitive impairment, so stated: Secondary | ICD-10-CM

## 2023-08-16 NOTE — Telephone Encounter (Signed)
 Pt's wife states that neuro is request hat he have a sleep study done

## 2023-08-17 ENCOUNTER — Telehealth: Payer: Self-pay

## 2023-08-17 NOTE — Telephone Encounter (Deleted)
 Copied from CRM [id number]. Topic: Clinical - Prescription Issue >> Aug 16, 2023 10:20 AM [name] wrote: Reason for CRM: Patient [id number] states fluticasone -salmeterol (WIXELA INHUB) 250-50 MCG/ACT AEPB is not working, feels like it's not good for him and stopped taking it the other day. Patient wants to go back to the old medication budesonide -formoterol  80-4.5 inhaler which worked best or something comparable. Please advise and call back.   [id number] [place], [place] - [address] [place] [address] Phone:[phone]:[phone]

## 2023-08-18 NOTE — Addendum Note (Signed)
 Addended by: Lowella Kindley E on: 08/18/2023 12:45 PM   Modules accepted: Orders

## 2023-08-19 ENCOUNTER — Encounter: Admitting: Psychology

## 2023-08-23 ENCOUNTER — Telehealth: Payer: Self-pay

## 2023-08-23 ENCOUNTER — Ambulatory Visit: Admitting: Pulmonary Disease

## 2023-08-23 ENCOUNTER — Ambulatory Visit: Payer: Self-pay | Admitting: Student in an Organized Health Care Education/Training Program

## 2023-08-23 ENCOUNTER — Encounter: Payer: Self-pay | Admitting: Pulmonary Disease

## 2023-08-23 VITALS — BP 128/69 | HR 77 | Ht 68.0 in | Wt 220.2 lb

## 2023-08-23 DIAGNOSIS — Z872 Personal history of diseases of the skin and subcutaneous tissue: Secondary | ICD-10-CM | POA: Diagnosis not present

## 2023-08-23 DIAGNOSIS — J45901 Unspecified asthma with (acute) exacerbation: Secondary | ICD-10-CM

## 2023-08-23 DIAGNOSIS — L570 Actinic keratosis: Secondary | ICD-10-CM | POA: Diagnosis not present

## 2023-08-23 DIAGNOSIS — B351 Tinea unguium: Secondary | ICD-10-CM | POA: Diagnosis not present

## 2023-08-23 DIAGNOSIS — D1801 Hemangioma of skin and subcutaneous tissue: Secondary | ICD-10-CM | POA: Diagnosis not present

## 2023-08-23 DIAGNOSIS — L821 Other seborrheic keratosis: Secondary | ICD-10-CM | POA: Diagnosis not present

## 2023-08-23 DIAGNOSIS — L308 Other specified dermatitis: Secondary | ICD-10-CM | POA: Diagnosis not present

## 2023-08-23 DIAGNOSIS — Z09 Encounter for follow-up examination after completed treatment for conditions other than malignant neoplasm: Secondary | ICD-10-CM | POA: Diagnosis not present

## 2023-08-23 MED ORDER — PREDNISONE 20 MG PO TABS: 20.0000 mg | ORAL_TABLET | Freq: Every day | ORAL | 0 refills | Status: DC

## 2023-08-23 MED ORDER — AMOXICILLIN-POT CLAVULANATE 875-125 MG PO TABS: 1.0000 | ORAL_TABLET | Freq: Two times a day (BID) | ORAL | 0 refills | Status: AC

## 2023-08-23 MED ORDER — FLUTICASONE-SALMETEROL 230-21 MCG/ACT IN AERO
2.0000 | INHALATION_SPRAY | Freq: Two times a day (BID) | RESPIRATORY_TRACT | 3 refills | Status: DC
Start: 2023-08-23 — End: 2023-11-10

## 2023-08-23 NOTE — Telephone Encounter (Signed)
 Copied from CRM [id number]. Topic: Clinical - Prescription Issue >> Aug 20, 2023  2:55 PM Margarette Shawl wrote: Reason for CRM:   Patient is calling in regarding his [facility] prescription. He reports the medication is not beneficial and he dislikes having to use the powder. He is requesting if another alternate medication could be called in, or if refills could be sent for Symbicort (which he used prior to Wixela)  [facility]   CB#  [phone]

## 2023-08-23 NOTE — Progress Notes (Signed)
 [name]    [id number]    1951/02/23  Primary Care Physician:Matthews, Aletha Hutching, DO  Referring Physician: Adela Holter, DO [address] [facility] [address] [place],  [place] [address]  Chief complaint:   Patient being seen for an acute visit  HPI:  Patient with a history of moderate persistent asthma  Has had cough wheezing, increasing shortness of breath  Never smoker  Had COVID in December 2024 has used a couple of courses of antibiotics since then  Was prescribed Symbicort , this was changed to [place] which she did not tolerate it managed to use it for few days but made him feel worse and has recently stopped using it  Was in the [place], no other pertinent occupational history, worked in Airline pilot No pets  Did describe some improvement in his symptoms when he was able to use Symbicort   Outpatient Encounter Medications as of 08/23/2023  Medication Sig   albuterol  (VENTOLIN  HFA) 108 (90 Base) MCG/ACT inhaler Inhale 2 puffs into the lungs every 6 (six) hours as needed for wheezing or shortness of breath.   amoxicillin -clavulanate (AUGMENTIN ) 875-125 MG tablet Take 1 tablet by mouth 2 (two) times daily for 10 days.   Cyanocobalamin  (VITAMIN B-12) 1000 MCG/15ML LIQD Take 1,000 mcg by mouth daily.   fluticasone -salmeterol (ADVAIR HFA) 230-21 MCG/ACT inhaler Inhale 2 puffs into the lungs 2 (two) times daily.   HYDROcodone -acetaminophen  (NORCO/VICODIN) 5-325 MG tablet Take 1 tablet by mouth every 6 (six) hours as needed.   ibuprofen (ADVIL) 600 MG tablet Take 600 mg by mouth every 6 (six) hours.   meloxicam  (MOBIC ) 15 MG tablet ONE TAB ORALLY EVERY 24 HOURS WITH A MEAL FOR 2 WEEKS, THEN ONCE EVERY 24 HOURS AS NEEDED FOR PAIN.   prednisoLONE acetate (PRED FORTE) 1 % ophthalmic suspension    predniSONE  (DELTASONE ) 20 MG tablet Take 1 tablet (20 mg total) by mouth daily with breakfast.   Spacer/Aero-Holding Chambers (AEROCHAMBER MV) inhaler Use as instructed   doxycycline   (VIBRAMYCIN ) 100 MG capsule Take one cap PO Q12hr with food. (Patient not taking: Reported on 08/23/2023)   fluticasone -salmeterol (WIXELA INHUB) 250-50 MCG/ACT AEPB Inhale 1 puff into the lungs in the morning and at bedtime. (Patient not taking: Reported on 08/23/2023)   guaiFENesin -codeine  100-10 MG/5ML syrup Take 5mL PO HS PRN cough (Patient not taking: Reported on 08/23/2023)   predniSONE  (DELTASONE ) 20 MG tablet Take one tab by mouth twice daily for 4 days, then one daily. Take with food. (Patient not taking: Reported on 08/23/2023)   No facility-administered encounter medications on file as of 08/23/2023.    Allergies as of 08/23/2023 - Review Complete 08/23/2023  Allergen Reaction Noted   Sesame oil Itching and Swelling 05/13/2012   Tobramycin Itching and Swelling 05/13/2012    Past Medical History:  Diagnosis Date   Allergy    Anxiety    Arthritis    Asthma    At risk for sleep apnea    STOP-BANG = 4  SENT TO PCP 05-24-2013   Cataract 2023   Eczema    Edema of foot    Finger wound, simple, open    left middle index finger - stitches and dressing occured on 09/22/2016- followed by Dr [name] at [facility]    H/O hiatal hernia    History of kidney stones    HOH (hard of hearing)    Hyperlipidemia    Influenza A    Influenza B  symptoms started 05-16-2013/  dx 05-17-2013 by pcp   Left ankle injury    twisted left ankle    Left ureteral calculus    Pneumonia    hx of    Rash    right leg- calf -    Wears dentures    bottom    Past Surgical History:  Procedure Laterality Date   ANTERIOR CERVICAL DECOMP/DISCECTOMY FUSION  01/27/2007   C6  --- C7   COLONOSCOPY  08/25/2016   TA   CYSTOSCOPY WITH URETEROSCOPY Left 05/26/2013   Procedure: CYSTOSCOPY WITH URETEROSCOPY, URETHERAL DILATATION,LEFT RETROGRADE, LASER LITHOTRIPSY, STENT PLACMENT, LEFT URETEROSCOPY;  Surgeon: Adam Gouge, MD;  Location: [facility];  Service: Urology;  Laterality: Left;    FINGER FRACTURE SURGERY  AS CHILD   GANGLION CYST EXCISION Left 2023   HOLMIUM LASER APPLICATION Left 05/26/2013   Procedure: HOLMIUM LASER APPLICATION;  Surgeon: Adam Gouge, MD;  Location: [facility];  Service: Urology;  Laterality: Left;   IR RADIOLOGIST EVAL & MGMT  03/12/2023   JOINT REPLACEMENT  2922   LIPOMA EXCISION  05/17/2012   Procedure: EXCISION LIPOMA;  Surgeon: Adam Imus, MD,FACS;  Location: [facility];  Service: General;  Laterality: Left;  incisional biopsy of left shoulder soft tissue mass   NASAL FRACTURE SURGERY  AS CHILD   ORIF ULNAR FRACTURE Right AS CHILD   ROTATOR CUFF REPAIR Right    right shoulder June2018 by [facility] [name] A [name]   TOTAL KNEE ARTHROPLASTY Right 12/05/2018   Procedure: LEFT ARTHROSCOPY, CHONDROPLASTY;  Surgeon: Adam Rei, MD;  Location: [facility];  Service: Orthopedics;  Laterality: Right;    TOTAL KNEE REVISION Right 12/11/2020   Procedure: Right knee polyethylene revision;  Surgeon: Adam Rei, MD;  Location: [facility];  Service: Orthopedics;  Laterality: Right;    Family History  Problem Relation Age of Onset   Arthritis Other    Hypertension Other    Osteoporosis Other    Heart disease Other    Lung disease Other    Prostate cancer Father    Hearing loss Father    Heart disease Sister    Asthma Mother    Colon cancer Neg Hx    Colon polyps Neg Hx    Esophageal cancer Neg Hx    Kidney disease Neg Hx    Rectal cancer Neg Hx    Stomach cancer Neg Hx     Social History   Socioeconomic History   Marital status: Married    Spouse name: [name]   Number of children: 2   Years of education: 13   Highest education level: Some college, no degree  Occupational History   Occupation: Nurse, learning disability   Occupation: Retired  Tobacco Use   Smoking status: Never    Passive exposure: Never   Smokeless tobacco: Never  Vaping Use   Vaping status: Never  Used  Substance and Sexual Activity   Alcohol use: Yes    Alcohol/week: 21.0 standard drinks of alcohol    Types: 21 Cans of beer per week    Comment: beer- 0-4 beers day sometimes   Drug use: Never   Sexual activity: Not Currently    Partners: Female  Other Topics Concern   Not on file  Social History Narrative   Lives with his wife. He has two children. He enjoys wood working, Engineer, water.   Social Drivers of Corporate investment banker  Strain: Low Risk  (12/01/2022)   Overall Financial Resource Strain (CARDIA)    Difficulty of Paying Living Expenses: Not hard at all  Food Insecurity: No Food Insecurity (12/01/2022)   Hunger Vital Sign    Worried About Running Out of Food in the Last Year: Never true    Ran Out of Food in the Last Year: Never true  Transportation Needs: No Transportation Needs (12/01/2022)   PRAPARE - Administrator, Civil Service (Medical): No    Lack of Transportation (Non-Medical): No  Physical Activity: Insufficiently Active (12/01/2022)   Exercise Vital Sign    Days of Exercise per Week: 4 days    Minutes of Exercise per Session: 20 min  Stress: No Stress Concern Present (12/01/2022)   [employer] of Occupational Health - Occupational Stress Questionnaire    Feeling of Stress : Not at all  Social Connections: Moderately Integrated (12/01/2022)   Social Connection and Isolation Panel [NHANES]    Frequency of Communication with Friends and Family: More than three times a week    Frequency of Social Gatherings with Friends and Family: Twice a week    Attends Religious Services: More than 4 times per year    Active Member of [employer] or Organizations: No    Attends Banker Meetings: Never    Marital Status: Married  Catering manager Violence: Not At Risk (04/19/2023)   Received from [facility]   HITS    Over the last 12 months how often did your partner physically hurt you?: Never    Over the last 12 months how often did your  partner insult you or talk down to you?: Never    Over the last 12 months how often did your partner threaten you with physical harm?: Never    Over the last 12 months how often did your partner scream or curse at you?: Never    Review of Systems  Vitals:   08/23/23 1549  BP: 128/69  Pulse: 77  SpO2: 95%     Physical Exam Constitutional:      Appearance: Normal appearance.  HENT:     Head: Normocephalic.     Nose: Nose normal.     Mouth/Throat:     Mouth: Mucous membranes are moist.  Eyes:     General: No scleral icterus. Cardiovascular:     Rate and Rhythm: Normal rate and regular rhythm.     Heart sounds: No murmur heard.    No friction rub.  Pulmonary:     Effort: No respiratory distress.     Breath sounds: No stridor. No wheezing or rhonchi.  Neurological:     Mental Status: He is alert.  Psychiatric:        Mood and Affect: Mood normal.    Data Reviewed: Reviewed Dr. Josiephine Nightingale notes  FeNo during last office visit was about 80  Assessment:  Acute exacerbation of asthma  Shortness of breath on exertion    Plan/Recommendations: Prescription for Augmentin  called into pharmacy  Will reschedule his pulmonary function test  Did not tolerate powder inhaler in the past, will write for Advair [facility]  Short course of prednisone   Encouraged to call with significant concerns  Follow-up in about 6 weeks  Myer Artis MD [facility] 08/23/2023, 9:17 PM  CC: Adam Holter, DO

## 2023-08-23 NOTE — Patient Instructions (Signed)
 Reschedule PFT  Will call in a prescription for Augmentin   Advair inhaler  Continue albuterol  use as needed  Follow-up in about 6 weeks

## 2023-08-23 NOTE — Telephone Encounter (Signed)
 Chief Complaint: SOB  Symptoms: wheezing Frequency: x2 days Pertinent Negatives: Patient denies fever, URI sx, CP, severe SOB Disposition: [] ED /[] Urgent Care (no appt availability in office) / [x] Appointment(In office/virtual)/ []  [facility]/ [] Home Care/ [] Refused Recommended Disposition /[] [facility]/ []  Follow-up with PCP Additional Notes: Pt c/o of SOB and wheezing x 2 days. Pt reports that he was initially on Wixela but did not tolerate the powder d/t irritation/coughing. Pt reports requesting alternative maintenance INH (generic Symbicort ), but has not received rx, so has not been using any maintenance INH x 1 week. Pt reports using albuterol  rescue INH to provide sx relief. Reports using 3-4x daily. Pt also endorses productive white/yellow mucus, chest tightness with coughing, and some lightheadedness with coughing. Denies fever, URI sx. Scheduled patient per protocol on August 23, 2023 with alternate provider.       Copied from CRM [id number]. Topic: Clinical - Red Word Triage >> Aug 23, 2023  2:42 PM Justina Oman C wrote: Red Word that prompted transfer to Nurse Triage: Patient [phone] states cannot take Wixela and need another inhaler, the pharmacy advised to contact the office for generic Symbicort  the insurance will not cover, patient's wife Devra Fontana is unsure of the name. Patient has a rescue inhaler, but needs a maintenance inhaler. Patient was having shortness of breath, wheezing, coughing yesterday. Patient started having shortness of breath when moving around, patient has to be still to not feel the symptoms. Please advise and call back.  [id number] [place] [place] - [address] [place] [address] Phone: [phone] Fax: [phone] Reason for Disposition  [1] MILD difficulty breathing (e.g., minimal/no SOB at rest, SOB with walking, pulse <100) AND [2] NEW-onset or WORSE than normal  Answer Assessment - Initial  Assessment Questions E2C2 Pulmonary Triage - Initial Assessment Questions "Chief Complaint (e.g., cough, sob, wheezing, fever, chills, sweat or additional symptoms) *Go to specific symptom protocol after initial questions. SOB, wheeze, productive white/yellow thick cough Has been out of maintenance INH x 1 week  "How long have symptoms been present?" X 2 days  Have you tested for COVID or Flu? Note: If not, ask patient if a home test can be taken. If so, instruct patient to call back for positive results. No  MEDICINES:   "Have you used any OTC meds to help with symptoms?" Yes If yes, ask "What medications?" Rx cough medicine Vicks severe cold - no relief  "Have you used your inhalers/maintenance medication?" Yes If yes, "What medications?" Still awaiting rx for generic Symbicort  Albuterol  PRN - used yesterday 3-4x  If inhaler, ask "How many puffs and how often?" Note: Review instructions on medication in the chart. See above   OXYGEN : "Do you wear supplemental oxygen ?" No If yes, "How many liters are you supposed to use?" N/a  "Do you monitor your oxygen  levels?" No   . RESPIRATORY STATUS: "Describe your breathing?" (e.g., wheezing, shortness of breath, unable to speak, severe coughing)      SOB, wheezing 2. ONSET: "When did this breathing problem begin?"      X2 days 3. PATTERN "Does the difficult breathing come and go, or has it been constant since it started?"      Comes and goes 4. SEVERITY: "How bad is your breathing?" (e.g., mild, moderate, severe)    - MILD: No SOB at rest, mild SOB with walking, speaks normally in sentences, can lie down, no retractions, pulse < 100.    -  MODERATE: SOB at rest, SOB with minimal exertion and prefers to sit, cannot lie down flat, speaks in phrases, mild retractions, audible wheezing, pulse 100-120.    - SEVERE: Very SOB at rest, speaks in single words, struggling to breathe, sitting hunched forward, retractions, pulse > 120       Mild-moderate Triager does not appreciate any audible SOB/wheezing. Pt speaking in full sentences. 5. RECURRENT SYMPTOM: "Have you had difficulty breathing before?" If Yes, ask: "When was the last time?" and "What happened that time?"      Yes, was initially on rescue INH, and them MD started on maintenance INH 6. CARDIAC HISTORY: "Do you have any history of heart disease?" (e.g., heart attack, angina, bypass surgery, angioplasty)      denies 7. LUNG HISTORY: "Do you have any history of lung disease?"  (e.g., pulmonary embolus, asthma, emphysema)     asthma 8. CAUSE: "What do you think is causing the breathing problem?"      Lack of maintenance INH 9. OTHER SYMPTOMS: "Do you have any other symptoms? (e.g., dizziness, runny nose, cough, chest pain, fever)     Denies Some lightheadedness  Protocols used: Breathing Difficulty-A-AH

## 2023-09-29 ENCOUNTER — Encounter: Attending: Psychology

## 2023-09-29 DIAGNOSIS — R413 Other amnesia: Secondary | ICD-10-CM | POA: Insufficient documentation

## 2023-09-29 NOTE — Progress Notes (Signed)
 Behavioral Observations: The patient was oriented to self, place, and some aspects of time (was able to name the day of the week and time of day but not the month, day, or year). His hearing and vision were mostly adequate for testing, however the patient forgot to bring his glasses to the testing appointment and endorsed having intermittent difficulties with seeing testing stimuli, especially smaller text (readers were offered and pt declined, citing that he has a very specific rx for glasses). He ambulated independently and without issue. No hand tremor was noted during testing. His speech was prosodic, fluent, and well-articulated. He displayed no clear indications of word-finding difficulties in conversational speech and no notable paraphasic errors were noted. Receptive language appeared intact. The patient's affect was congruent with mood and his mood was largely neutral to positive. The patient was alert and participated in testing as instructed. He was cooperative throughout the session. His pace was mostly steady, though the patient was somewhat talkative between subtests. He showed no difficulties with frustration tolerance. The patient's social interactions were unremarkable and consistent with the setting. The patient described having intermittent lapses in attention throughout the testing session.  Neuropsychology Note  [name] completed 120 minutes of neuropsychological testing with technician, Rhett Cella, BA, under the supervision of Aleene Hurry, PsyD., Clinical Neuropsychologist. The patient did not appear overtly distressed by the testing session, per behavioral observation or via self-report to the technician. Rest breaks were offered.   Clinical Decision Making: In considering the patient's current level of functioning, level of presumed impairment, nature of symptoms, emotional and behavioral responses during clinical interview, level of literacy, and observed level of  motivation/effort, a battery of tests was selected by Dr. Georgeanne King during initial consultation on 08/09/2023. This was communicated to the technician. Communication between the neuropsychologist and technician was ongoing throughout the testing session and changes were made as deemed necessary based on patient performance on testing, technician observations and additional pertinent factors such as those listed above.  Tests Administered: Automatic Data Edition (BNT-2) Brief Visuospatial Memory Test-Revised (BVMT-R) [place]enton Judgment of Line Orientation (JOLO) Clock Drawing Test Controlled Oral Word Association Test (FAS & Animals) Orthoptist System (D-KEFS), select subtests Grooved Pegboard Test [employer] Verbal Learning Test - Revised (HVLT-R) Repeatable Battery for the Assessment of Neuropsychological Status Update (RBANS) Trail Making Test (TMT; Part A & B) Wechsler Adult Intelligence Scale-Fourth Edition (WAIS-IV), select subtests Wechsler Memory Scale-Fourth Edition (WMS-IV) , select subtests Wechsler Memory Scale-Third Edition (WMS-III), select subtests  Wechsler Test of Adult Reading (WTAR) Geriatric Depression Scale-Short Form (GDS-SF) Geriatric Anxiety Inventory (GAI)  Results: Note: This summary of test scores accompanies the interpretive report and should not be interpreted by unqualified individuals or in isolation without reference to the report. Test scores are relative to age, gender, and educational history as available and appropriate.   Measurement properties of test scores: IQ, Index, and Standard Scores (SS): Mean = 100; Standard Deviation = 15; Scaled Scores (ss): Mean = 10; Standard Deviation = 3; Z scores (Z): Mean = 0; Standard Deviation = 1; T scores (T); Mean = 50; Standard Deviation = 10  Intellectual/Premorbid Functioning Estimate   Norm Score Percentile  Range  Wechsler Test of Adult Reading  SS = 105 63 %ile Average   ATTENTION AND  WORKING MEMORY   Norm Score Percentile  Range  WAIS-IV          Digit Span  ss = 7 16 %ile Low Average  DSF  ss = 9 37 %ile Average   Span:    6      DSB  ss = 6 9 %ile Low Average   Span:    3      DSS  ss = 7 16 %ile Low Average   Span:    4     WMS-III          Spatial Span  ss = 8 25 %ile Average   SSF  ss = 5 5 %ile Below Average   Span:    3      SSB  ss = 12 75 %ile High Average   Span:    5      PROCESSING SPEED    Norm Score Percentile  Range  WAIS-IV          Coding  ss = 7 16 %ile Low Average   PSYCHOMOTION    Norm Score Percentile  Range  Grooved Pegboard - Dominant  t = 45 30 %ile Average   # of drops    1     Grooved Pegboard - Nondominant  t = 36 8 %ile Below Average   # of drops    0      LANGUAGE    Norm Score Percentile  Range  [place] Naming Test (BNT-2)  t = 52 58 %ile Average  COWAT          FAS  t = 66 95 %ile Above Average   Animals  t = 55 68 %ile Average   EXECUTIVE FUNCTIONING    Norm Score Percentile  Range  DKEFS - Color-Word Interference          Color Naming  ss = 6 9 %ile Low Average   Word Reading  ss = 6 9 %ile Low Average   Inhibition  ss = 5 5 %ile Below Average   Errors  ss = 10 50 %ile Average   Inhibition Switching  ss = 8 25 %ile Average   Errors  ss = 3 1 %ile Exceptionally Low  Trails A  t = 32 4 %ile Below Average  Trails B  t = 51 53 %ile Average   MEMORY    Norm Score Percentile  Range  BVMT-R          Trial 1  t = 37.0 9 %ile Low Average   Trial 2  t = 36 8 %ile Below Average   Trial 3  t = 31 3 %ile Exceptionally Low   Total Recall  t = 32.0 4 %ile Exceptionally Low   Learning  t = 41 18 %ile Below Average   Delayed Recall  t = <20 <0.1 %ile Exceptionally Low   % Retained    0 <1 %ile Exceptionally Low   Hits     6-10 %ile Low to Below Average   False Alarms     >16 %ile WNL   Recognition Discriminability     11-16 %ile Low Average  HVLT          Total Recall  t = 33.0 5 %ile Below Average   Delayed Recall  t = <20  <0.1 %ile Exceptionally Low   %Retention  t = <20 <0.1 %ile Exceptionally Low   Recognition Discriminability  t = <20 <0.1 %ile Exceptionally Low  Wechsler Memory Scale, 4th Edition (WMS-4)         Log. Mem. Immediate Recall  ss = 7 16 %ile  Low Average   Logical Memory Delayed Recall  ss = 1 0.1 %ile Exceptionally Low   Logical Recognition    3rd-9th  %ile Low to Below Average   VISUAL-SPATIAL    Norm Score Percentile  Range  WAIS-IV          Block Design  ss = 11 63 %ile Average   Matrix Reasoning  ss = 9 37 %ile Average  [place] JOLO  ss = 5 5 %ile Below Average  Clock                   RBANS Visuospatial Index          RBANS Figure Copy  ss = 14 91 %ile Above Average   PERSONALITY AND BEHAVIORAL FUNCTIONING      Score/Interpretation  GDS-SF Raw       1  GDS-SF Severity       Minimal.  GAI Raw       0  GAI Severity       Minimal.    Feedback to Patient: [name] will return on 10/07/2023 for an interactive feedback session with Dr. Georgeanne King at which time his test performances, clinical impressions and treatment recommendations will be reviewed in detail. The patient understands he can contact our office should he require our assistance before this time.  120 minutes spent face-to-face with patient administering standardized tests, 30 minutes spent scoring Radiographer, therapeutic). [CPT [id number], [id number]]  Full report to follow.

## 2023-10-04 ENCOUNTER — Encounter: Admitting: Psychology

## 2023-10-04 DIAGNOSIS — G3184 Mild cognitive impairment, so stated: Secondary | ICD-10-CM | POA: Diagnosis not present

## 2023-10-07 ENCOUNTER — Encounter: Admitting: Psychology

## 2023-10-07 DIAGNOSIS — G3184 Mild cognitive impairment, so stated: Secondary | ICD-10-CM | POA: Diagnosis not present

## 2023-10-07 DIAGNOSIS — R413 Other amnesia: Secondary | ICD-10-CM | POA: Diagnosis not present

## 2023-10-07 NOTE — Progress Notes (Signed)
 NEUROPSYCHOLOGICAL EVALUATION [place]  Physical Medicine and Rehabilitation     Patient: [name]  MRN: [id number] DOB: [birth year]  Age: 73 y.o. Sex: male  Race/Ethnicity: White or Caucasian  Years of Formal Education: 12 Handedness: Right  Collateral Information Source: Spouse Jethro)  Referring Provider: Velma Ku, DO  Provider/Clinical Neuropsychologist: Evalene DOROTHA Riff, PsyD  Date of Service: 10/04/23 Start Time: 10 AM End Time: 11 AM  Location of Service:  [facility] [place] [address], [place] [place], [place] [address] Phone: [phone]  Billing Code/Service: [phone]  Individuals Present: Evalene Riff, PsyD 1 hour was spent on interpretation of patient data, interpretation of standardized test results and clinical data, clinical decision making, initial treatment planning/recommendations, and report writing. The report will be amended as needed based on any additional information collected during interactive feedback session.   REASON FOR REFERRAL The patient was referred for neuropsychological evaluation by his primary care provider, Dr. Ku, due to concerns of memory loss.  The patient has a history of asthma, multiple joint replacement surgeries, chronic pain, hyperlipidemia, anxiety, and hearing loss.  The patient was referred for a sleep study due to concerns for OSA. Per records from the referring provider (02/01/2023), both he and his wife have noticed declines in his short-term memory and increased forgetfulness.  Upon interview, the patient and his wife indicated their hopes that the evaluation would be able to provide insight into the patient's short-term memory problems.  Specifically, they hope to understand whether these difficulties are age-related or due to a neurodegenerative condition.   HISTORY OF PRESENTING CONCERNS Cognitive Symptom  Onset & Course: During the interview, the patient indicated that he has noticed mild changes in his memory but acknowledged that his wife has seen more significant changes.  Cognitive changes reported to have become more noticeable around a year ago.  Course has reportedly been relatively stable.  No significant precipitating or clearly contributory events were identified.   Current Cognitive Complaints:  Memory: The patient reportedly has some difficulties remembering conversations, although he indicated that attention and concentration may be contributing factors.  The patient frequently misplaces things, which reflects a worsening of premorbid tendencies.  The patient's wife indicated that he often repeats himself without realizing it.  With regards to medications, he occasionally has trouble remembering to use his inhaler although this is a relatively new routine (taking daily medications) and has improved somewhat over time.  The patient receives some assistance with tracking appointments.  The patient denied any problems getting lost while driving in familiar places. Processing Speed: No significant changes in processing speed were reported. Attention & Concentration: The patient reports premorbid difficulties with attention and concentration that have been lifelong.  These difficulties may have worsened slightly.  Patient is largely able to attend in conversation although has historically tended to be thinking ahead, sometimes missing details.  The patient indicated that he is able to sustain attention for extended durations (watching a movie) and is able to maintain his train of thought for important tasks. Language: No difficulties with word finding were noted and collateral denied noticing any paraphasic errors.  With respect to receptive language, the patient cited indications of possible interference due to attention/concentration rather than language comprehension.  Patient denied any significant  changes with reading, writing, or doing mental arithmetic. 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  Executive Functioning: The patient described some changes with respect to decision making and judgment that appear secondary to impulsivity/going too quickly (I.e., doesn't give himself enough time to think something through.  There were no clear indications of impaired judgment or problem solving when accounting for these tendencies. The patient also acknowledged that he tends to be more easily frustrated.    Motor/Sensory Complaints: The patient reported possible slight declines in sense of smell over the years.  He denied any changes in his sense of taste.  The patient had cataract surgery and some age related declines in vision.  The patient has had difficulty with hearing and wears hearing aids.  The patient denied any problems with tremor, balance, frequent falls, or unaccounted for lightheadedness.  Emotional and Behavioral Functioning: The patient denied any problems with frequent feelings of low mood, anhedonia, or other clear indications of depressive symptomology.  The patient denied any significant problems with worry or anxiety overall. He denied any significant history of depression or anxiety.  He denied any current or past suicidal ideation, homicidal ideation, hallucinations, paranoia, or symptoms of mania.  Sleep: The patient has been referred for a sleep study but has historically been reluctant to follow through.  Currently, the patient obtains around 7 to 8 hours of sleep per night.  He has no difficulty falling asleep or returning to sleep upon waking during the night.  He indicated that he feels rested in the morning, but acknowledged that he could take a nap after being awake for only a few hours. Appetite: No significant issues or changes. Caffeine:  2 to 3 cups of coffee each morning. Alcohol Use: Patient reported consuming around 4-6 servings of alcohol total per week on average.  He reported decrease in alcohol consumption over the last several months.  He denied any history of heavy daily alcohol consumption. Tobacco Use: None. Recreational Substance Use: None.  Level of Functional Independence: The patient is independent with basic activities of daily living.  The patient is largely independent with instrumental activities of daily living although receives some support from his wife for remembering medications and tracking medical appointments.  Medical History/Record Review: The patient indicated that his blood pressure and cholesterol are well-managed.  He denied any history of seizures, heart attack, radiation of the head, or chemotherapy.  The patient has undergone multiple surgeries (back, knee, rotator cuff, joint replacements, etc.) and reported experiencing chronic pain that is typically an 8 out of 10 on average.  The patient recently underwent dental surgery (early April) and described significant pain and discomfort which requires follow up.   Past Medical History:  Diagnosis Date   Allergy    Anxiety    Arthritis    Asthma    At risk for sleep apnea    STOP-BANG = 4  SENT TO PCP 05-24-2013   Cataract 2023   Eczema    Edema of foot    Finger wound, simple, open    left middle index finger - stitches and dressing occured on 09/22/2016- followed by Dr Daws at [facility]    H/O hiatal hernia    History of kidney stones    HOH (hard of hearing)    Hyperlipidemia    Influenza A    Influenza B    symptoms started 05-16-2013/  dx 05-17-2013 by pcp   Left ankle injury    twisted left ankle    Left ureteral calculus    Pneumonia  hx of    Rash    right leg- calf -    Wears dentures    bottom   Patient Active Problem List   Diagnosis Date Noted   Cough 06/15/2023   Acute bronchitis 05/17/2023   MCI (mild  cognitive impairment) 02/01/2023   Well adult exam 09/09/2022   Impingement of left shoulder 07/20/2022   Lumbar spondylosis 04/09/2022   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 Family History  Problem Relation Age of Onset   Arthritis Other    Hypertension Other    Osteoporosis Other    Heart disease Other    Lung disease Other    Prostate cancer Father    Hearing loss Father    Heart disease Sister    Asthma Mother    Colon cancer Neg Hx    Colon polyps Neg Hx    Esophageal cancer Neg Hx    Kidney disease Neg Hx    Rectal cancer Neg Hx    Stomach cancer Neg Hx    Medications:  albuterol  (VENTOLIN  HFA) 108 (90 Base) MCG/ACT inhaler Cyanocobalamin  (VITAMIN B-12) 1000 MCG/15ML LIQD doxycycline  (VIBRAMYCIN ) 100 MG capsule fluticasone -salmeterol (WIXELA INHUB) 250-50 MCG/ACT AEPB guaiFENesin -codeine  100-10 MG/5ML syrup meloxicam  (MOBIC ) 15 MG tablet prednisoLONE acetate (PRED FORTE) 1 % ophthalmic suspension predniSONE  (DELTASONE ) 20 MG tablet Spacer/Aero-Holding Chambers (AEROCHAMBER MV) inhaler  Educational/Vocational History: The patient indicated that he tended to earned B's and C's, and some A's, in high school and technical school.  He denied any formal learning difficulties growing up but, as noted, has premorbid difficulties with attention and concentration.  The patient served in [employer] and was deployed during [place].  He denied any PTSD related symptomatology.  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  The patient retired in 2020.  Psychosocial: Marital Status: Married to his wife for 48 years.  Children/Grandchildren: 2 adult children and 3 grandchildren. Living Situation: Alone with his wife.   NEUROPSYCHODIAGNOSTIC FINDINGS: Behavioral Observations:The patient was oriented to self, place, and some aspects of time (was able to name the day of the week and time of day but not the month, day, or year). His hearing and vision were mostly adequate for testing, however the patient forgot to bring his glasses to the testing appointment and endorsed having intermittent difficulties with seeing testing stimuli, especially smaller text (readers were offered and pt declined, citing that he has a very specific rx for glasses). He ambulated independently and without issue. No hand tremor was noted during testing. His speech was prosodic, fluent, and well-articulated. He displayed no clear indications of word-finding difficulties in conversational speech and no notable paraphasic errors were noted. Receptive language appeared intact. The patient's affect was congruent with mood and his mood was largely neutral to positive. The patient was alert and participated in testing as instructed. He was cooperative throughout the session. His pace was mostly steady, though the patient was somewhat talkative between subtests. He showed no difficulties with frustration tolerance. The patient's social interactions were unremarkable and consistent  with the setting. The patient described having intermittent lapses in attention throughout the testing session.  Tests Administered: Automatic Data Edition (BNT-2) Brief Visuospatial Memory Test-Revised (BVMT-R) Benton Judgment of Line Orientation (JOLO) Clock Drawing Test Controlled Oral Word Association Test (FAS & Animals) Orthoptist System (D-KEFS), select subtests Grooved Pegboard Test [employer]  Verbal Learning Test - Revised (HVLT-R) Repeatable Battery for the Assessment of Neuropsychological Status Update (RBANS), select subtests Trail Making Test (TMT; Part A & B) Wechsler Adult Intelligence Scale-Fourth Edition (WAIS-IV), select subtests Wechsler Memory Scale-Fourth Edition (WMS-IV) , select subtests Wechsler Memory Scale-Third Edition (WMS-III), select subtests  Wechsler Test of Adult Reading (WTAR) Geriatric Depression Scale-Short Form (GDS-SF) Geriatric Anxiety Inventory (GAI)  Results: Test scores are relative to age, gender, and educational history as available and appropriate.  Measurement properties of test scores: IQ, Index, and Standard Scores (SS): Mean = 100; Standard Deviation = 15; Scaled Scores (ss): Mean = 10; Standard Deviation = 3; Z scores (Z): Mean = 0; Standard Deviation = 1; T scores (T); Mean = 50; Standard Deviation = 10  Intellectual/Premorbid Functioning Estimate   Norm Score Percentile  Range  Wechsler Test of Adult Reading  SS = 105 63 %ile Average  Premorbid cognitive abilities are estimated to be within the average range based on a word-reading/recognition measure and patient history.  ATTENTION AND WORKING MEMORY   Norm Score Percentile  Range  WAIS-IV          Digit Span  ss = 7 16 %ile Low Average   DSF  ss = 9 37 %ile Average   Span:    6      DSB  ss = 6 9 %ile Low Average   Span:    3      DSS  ss = 7 16 %ile Low Average   Span:    4     WMS-III          Spatial Span  ss = 8 25 %ile Average   SSF  ss = 5 5 %ile Below Average   Span:    3      SSB  ss = 12 75 %ile High Average   Span:    5     Overall auditory-verbal attention performance was scored in the low average range. His performance on the subtest assessing basic span was average. Scores on the two subtests with greater demands on auditory working-memory were within the low average range.   His performance on a measure of visual attention was average overall but there was  significant variability (>2 SD difference) on component measures with a below average score when asked to point to the stimuli in order of initial presentation, but a high average score when asked to point to the stimuli in reverse order relative to presentation.  PROCESSING SPEED    Norm Score Percentile  Range  WAIS-IV          Coding  ss = 7 16 %ile Low Average  Processing speed, assessed via speeded digit symbol translation task, was score in the low average range.   PSYCHOMOTION    Norm Score Percentile  Range  Grooved Pegboard - Dominant  t = 45 30 %ile Average   # of drops    1     Grooved Pegboard - Nondominant  t = 36 8 %ile Below Average   # of drops    0     His speeded motor dexterity  was average for his dominant hand, but below average with his non-dominant hand. The score difference was just under 1 SD.   LANGUAGE    Norm Score Percentile  Range  Boston Naming Test (BNT-2)  t = 52 58 %ile Average  COWAT          FAS  t = 66 95 %ile Above Average   Animals  t = 55 68 %ile Average  The patient's confrontation-naming/word retrieval performance was average by age and education. His score was above average for phonemic verbal fluency and average for semantic verbal fluency (1 SD difference)  EXECUTIVE FUNCTIONING    Norm Score Percentile  Range  DKEFS - Color-Word Interference          Color Naming  ss = 6 9 %ile Low Average   Word Reading  ss = 6 9 %ile Low Average   Inhibition  ss = 5 5 %ile Below Average   Errors  ss = 10 50 %ile Average   Inhibition Switching  ss = 8 25 %ile Average   Errors  ss = 3 1 %ile Exceptionally Low  Trails A  t = 32 4 %ile Below Average  Trails B  t = 51 53 %ile Average  The patient's basic response-inhibition performance was below average in speed but average for accuracy. When a set-shifting component was added, he was average for speed, but his accuracy fell into the exceptionally low score range. His speed on a psychomotor sequencing task was  scored in the below average range, but his performance was considerably stronger on the combination sequencing + set-shifting trial with an average range score.   MEMORY    Norm Score Percentile  Range  BVMT-R          Trial 1  t = 37.0 9 %ile Low Average   Trial 2  t = 31 3 %ile Below Average   Trial 3  t = 27.0 1 %ile Exceptionally Low   Total Recall  t = 29.0 2 %ile Exceptionally Low   Learning  t = 35.0 7 %ile Below Average   Delayed Recall  t = <20 <0.1 %ile Exceptionally Low   % Retained    0 <1 %ile Exceptionally Low   Hits     6-10 %ile Low to Below Average   False Alarms     >16 %ile WNL   Recognition Discriminability     11-16 %ile Low Average  HVLT          Total Recall  t = 33.0 5 %ile Below Average   Delayed Recall  t = <20 <0.1 %ile Exceptionally Low   %Retention  t = <20 <0.1 %ile Exceptionally Low   Recognition Discriminability  t = <20 <0.1 %ile Exceptionally Low  Wechsler Memory Scale, 4th Edition (WMS-4)         Log. Mem. Immediate Recall  ss = 7 16 %ile Low Average   Logical Memory Delayed Recall  ss = 1 0.1 %ile Exceptionally Low   Logical Recognition    3rd-9th  %ile Low to Below Average  The patient completed one visual memory test and two auditory-verbal memory tests. On the visual memory test, his learning performance (gains from repetition) across the three learning trials was scored in the below average range. Total information encoded across the three learning trials (Total Recall) was score din the exceptionally low score range. His performance with delayed free recall was scored in the exceptionally low  score range and scored similarly for percent retention (0%). Qualitative examination of his responses with delayed free recall showed that he did retain some information, but accuracy / detail was too limited generate any points when scored. He showed improvement with cueing on the recognition task with no false positives (WNL), 4 of 6 true positives (low to below  average), and a low average overall discriminability performance.   The patient had more difficulty on the word-list verbal memory test. Qualitatively, he appeared to benefit from repetition as he recalled an increasing number of words across the three learning trials (4,5,6). He was unable to freely recall any of the words following a delay (exceptionally low delayed recall and % retained). On the recognition task, he correctly identified the 7 target words consistent with those he initially encoded, but had 6 false positive errors and subsequently scored in the exceptionally low score range for recognition discriminability.   On the verbal memory test involving two short stories, he scored low average for immediate recall, exceptionally low for delayed free recall (unable to recall details), and slight relative improvement on the recognition task with a low to below average range performance.    VISUAL-SPATIAL    Norm Score Percentile  Range  WAIS-IV          Block Design  ss = 11 63 %ile Average   Matrix Reasoning  ss = 9 37 %ile Average  Benton JOLO  ss = 5 5 %ile Below Average  Clock       Atypical            RBANS Visuospatial Index          RBANS Figure Copy  ss = 14 91 %ile Above Average  Performances on visual-spatial tasks were somewhat mixed. He performed in line with expectations (average) on a visual construction task requiring him to reproduce two-dimensional figures using three dimensional blocks under timed conditions. Similarly, he performed within the average range on an abstract non-verbal reasoning task. His score on a task requiring him to copy a geometric figure was scored in the above average range.   His score on visual perception tasks requiring judgment of line orientations was scored in the below average range. His clock drawing showed minor indications of difficulty in initial visual planning. His designation of time was close, but slightly off from the requested time.    PERSONALITY AND BEHAVIORAL FUNCTIONING      Score/Interpretation  GDS-SF Raw       1  GDS-SF Severity       Minimal.  GAI Raw       0  GAI Severity       Minimal.  Self-report on measures of depression and anxiety showed no clinically significant score elevations.    SUMMARY / CLINICAL IMPRESSIONS The patient was referred for neuropsychological evaluation by his primary care provider, Dr. Alvia, due to concerns of memory loss.  The patient has a history of asthma, multiple joint replacement surgeries, chronic pain, hyperlipidemia, anxiety, and hearing loss.  The patient was referred for a sleep study due to concerns for OSA. Per records from the referring provider (02/01/2023), both he and his wife have noticed declines in his short-term memory and increased forgetfulness.  Per patient and collateral, the patient has had a decline in cognition which became noticeable approximately 1 year ago.  Changes have been relatively stable.  Difficulties involve short-term memory, exacerbation of premorbid weaknesses in attention/concentration, and some indications of mild impulsivity  and reduced frustration tolerance.  No significant changes in personality were reported.  Patient denies any significant psychiatric symptoms at present.  The patient has considerable chronic pain and an extensive history of surgical procedures. The patient has asthma and cardiovascular health conditions which are reportedly well managed via medication. He has been referred for a sleep study due to concerns for OSA.   The patient's cognitive test profile showed indications of cognitive decline with multiple performances below premorbid estimates. The most significant and consistent deficits involved memory. The patient's memory profile showed some variability. There were some indications of information loss over time, but also some improvement with cueing roughly in-line with initial encoding. Some performances in executive  functioning were well below expectations. Performances on measures of executive functioning were somewhat mixed. With the response-inhibition-only trial, his speed was low but his accuracy was intact. When set-shifting was combined with response-inhibition, his speed actually increased and rose to the average range, but accuracy fell significantly and into the exceptionally low score range. However, he had no difficulty on a combination set-shifting/sequencing task with an average range performance. More modest declines were seen in processing speed and some significant variability was noted on measures of attention. Processing speed was scored in the low average range, which may reflect a decline. With attention, mild deficits were seen with auditory-verbal attention subtests emphasizing working memory. The performances on visual attention were quite variable and are suspicious for attention lapses/variable attention. The patient's language performances and visual-spatial abilities were predominantly at or above expectations excluding one visual perception task.    The patient's test results show evidence of significant cognitive decline that is most consistent within the domain of memory but also seen to varying degrees in other cognitive domains. The provider obtained information regarding independent functioning during the initial interview and revisited the topic again during the feedback appointment with the patient and his wife to confirm initial reports. Per patient and collateral reports, the patient receives some support with instrumental activities of daily living (medication and appointment reminders), but is otherwise functioning independently. Diagnostically, deficits seen on testing are more significant than is typically seen with Minor Neurocognitive disorder, falling more towards Major Neurocognitive disorder. 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 There are several factors which complicate conclusions regarding etiology (premorbid difficulties, chronic pain, sleep difficulties) and likely course. 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  Close monitoring including re-evaluation in 12 months is recommended, and additional medical testing / evaluation may be beneficial.   The patient's cognitive profile is somewhat inconsistent in terms of alignment with a single etiology. The patient has cardiovascular risk factors for cerebrovascular disease, and several aspects of his cognitive profile align with that etiology. Difficulties on measures of executive functioning, slowed processing, and attention problems are common features of cerebrovascular disease. His memory profile showed some subtle indications of encoding/retrieval difficulties (dysexecutive memory profile) which would also align with cerebrovascular disease. The possibility of an Alzheimer's pathology cannot be ruled out entirely. The patient's memory profile also showed indications of rapid information loss over time. His language profile was quite a bit stronger better than would be expected for Alzheimer's disease, although the weaker semantic verbal fluency performance relative to phonemic verbal fluency is notable. Overall, the profile is suspicious for cerebrovascular disease, but AD cannot be entirely ruled out. Interpretation is also challenged by potential interference from other factors (e.g.,  chronic pain, sleep difficulty) and suspicion of premorbid/pre-existing cognitive weaknesses in attention and even executive functioning. Imaging data and additional medical testing may provide additional insight.   Diagnosis: Minor neurocognitive disorder  Recommendations:  Follow-up with the referring provider.  Brain imaging and additional medical testing (I.e., ATN profile, AD  risk genotyping) may be beneficial in clarifying likely etiology if determined to be appropriate by the referring/treating physician. Referral to neurology may be appropriate if deemed beneficial by the referring provider.  Re-evaluation in 64-months is recommended given concerns for increased risk of cognitive decline.  Continuing to work with medical providers to manage chronic health conditions, particularly those associated with increased risk of cerebrovascular disease (e.g., cardiovascular health etc) is encouraged.  Remaining cognitive active and engaged in day to day life as this provides cognitive stimulation which is beneficial for brain health and reducing risk of cognitive decline.  Monitoring for difficulties in independent functioning is encouraged. Setting up collaborative systems for instrumental activities of daily living in which errors could impact safety or otherwise have significant consequences (e.g., medications, finances) is encouraged. (A common example of a collaborative system involves having a loved one double check pill organizer was correctly filled, stepping in to provide assistance as needed etc).  Please feel free to reach out with any questions regarding results of this evaluation and recommendations.               Evalene DOROTHA Riff, PsyD             Neuropsychologist   This report was generated using voice recognition software. While this document has been carefully reviewed, transcription errors may be present. I apologize in advance for any inconvenience. Please contact me if further clarification is needed.

## 2023-10-14 ENCOUNTER — Telehealth: Payer: Self-pay | Admitting: Student in an Organized Health Care Education/Training Program

## 2023-10-14 NOTE — Telephone Encounter (Signed)
 Patient needs to be rescheduled to 11/10/2023 or 11/24/2023.

## 2023-10-25 ENCOUNTER — Other Ambulatory Visit: Payer: Self-pay

## 2023-10-25 ENCOUNTER — Ambulatory Visit: Admitting: Pulmonary Disease

## 2023-10-25 DIAGNOSIS — J4540 Moderate persistent asthma, uncomplicated: Secondary | ICD-10-CM

## 2023-10-25 DIAGNOSIS — R0602 Shortness of breath: Secondary | ICD-10-CM

## 2023-10-25 DIAGNOSIS — J45901 Unspecified asthma with (acute) exacerbation: Secondary | ICD-10-CM

## 2023-10-25 LAB — PULMONARY FUNCTION TEST
DL/VA % pred: 111 %
DL/VA: 4.48 ml/min/mmHg/L
DLCO unc % pred: 88 %
DLCO unc: 21.05 ml/min/mmHg
FEF 25-75 Post: 3.2 L/s
FEF 25-75 Pre: 1.9 L/s
FEF2575-%Change-Post: 68 %
FEF2575-%Pred-Post: 153 %
FEF2575-%Pred-Pre: 90 %
FEV1-%Change-Post: 18 %
FEV1-%Pred-Post: 92 %
FEV1-%Pred-Pre: 77 %
FEV1-Post: 2.63 L
FEV1-Pre: 2.21 L
FEV1FVC-%Change-Post: 11 %
FEV1FVC-%Pred-Pre: 99 %
FEV6-%Change-Post: 8 %
FEV6-%Pred-Post: 86 %
FEV6-%Pred-Pre: 79 %
FEV6-Post: 3.18 L
FEV6-Pre: 2.92 L
FEV6FVC-%Pred-Post: 106 %
FEV6FVC-%Pred-Pre: 106 %
FVC-%Change-Post: 6 %
FVC-%Pred-Post: 82 %
FVC-%Pred-Pre: 77 %
FVC-Post: 3.24 L
FVC-Pre: 3.04 L
Post FEV1/FVC ratio: 81 %
Post FEV6/FVC ratio: 100 %
Pre FEV1/FVC ratio: 73 %
Pre FEV6/FVC Ratio: 100 %
RV % pred: 98 %
RV: 2.4 L
TLC % pred: 81 %
TLC: 5.44 L

## 2023-10-25 NOTE — Progress Notes (Signed)
 Full pft performed today.

## 2023-10-25 NOTE — Patient Instructions (Signed)
 Full pft performed today.

## 2023-10-26 NOTE — Telephone Encounter (Signed)
 Closing encounter

## 2023-11-03 NOTE — Progress Notes (Signed)
   NEUROPSYCHOLOGICAL EVALUATION [place]  Physical Medicine and Rehabilitation     Patient: [name]  MRN: [id number] DOB: 06-Mar-1951   Service Provider/Clinical Neuropsychologist: Evalene DOROTHA Riff, PsyD  Date of Service: 10/07/23 Start Time: 3 PM End Time: 4 PM  Location of Service:  [facility] Physical Medicine & Rehabilitation Department [place]. [facility] [address], [place] [place], [place] [address] Phone: [phone]   Billing Code/Service: [phone]    Individuals present: Patient, patient's spouse, Provider Laurier DOROTHA Riff, PsyD)  Provider conducted the 60-minute interactive feedback appointment in-person with the patient, accompanied by patient's spouse with patient consent.  The provider reviewed and discussed the results of neuropsychological evaluation. Follow-up interviewing was conducted as needed to refine interpretation of findings as needed. Review of results included overall findings, diagnosis, and treatment planning/recommendations that were derived from integration of patient data, interpretation of standardized rest results and clinical data, and clinical decision making, which are documented in the patient's electronic medical record with the full report (date listed below). A copy of the full report will also be mailed to the patient.   The patient expressed understanding of the information reviewed. The patient was provided opportunity to ask questions which were then answered by the provider. The provider worked collaboratively to tailor treatment recommendations to the patient when possible. The patient was informed they could reach out to the provider should additional questions related to the evaluation arise.    The final neuropsychological evaluation report, documented in the patient's chart on 10/04/23, was amended to reflect any additional information obtained during the feedback appointment including treatment  planning collaboration.              Adam DOROTHA Riff, PsyD             Neuropsychologist   This report was generated using voice recognition software. While this document has been carefully reviewed, transcription errors may be present. I apologize in advance for any inconvenience. Please contact me if further clarification is needed.

## 2023-11-04 ENCOUNTER — Ambulatory Visit: Admitting: Student in an Organized Health Care Education/Training Program

## 2023-11-10 ENCOUNTER — Ambulatory Visit: Admitting: Pulmonary Disease

## 2023-11-10 VITALS — BP 133/74 | HR 59

## 2023-11-10 DIAGNOSIS — R0602 Shortness of breath: Secondary | ICD-10-CM

## 2023-11-10 DIAGNOSIS — J4540 Moderate persistent asthma, uncomplicated: Secondary | ICD-10-CM | POA: Diagnosis not present

## 2023-11-10 MED ORDER — FLUTICASONE-SALMETEROL 230-21 MCG/ACT IN AERO: 2.0000 | INHALATION_SPRAY | Freq: Two times a day (BID) | RESPIRATORY_TRACT | 3 refills | Status: DC

## 2023-11-10 MED ORDER — ALBUTEROL SULFATE HFA 108 (90 BASE) MCG/ACT IN AERS: 2.0000 | INHALATION_SPRAY | Freq: Four times a day (QID) | RESPIRATORY_TRACT | 3 refills | Status: AC | PRN

## 2023-11-10 NOTE — Patient Instructions (Signed)
 Your breathing study looks good - Consistent with asthma  Continue using your inhalers  Rescue inhaler as needed  Follow-up in 6 months  Call us  with significant concerns

## 2023-11-10 NOTE — Progress Notes (Signed)
 [name]    [id number]    [birth year]  Primary Care Physician:Matthews, Velma, DO  Referring Physician: Alvia Velma, DO [address] [facility] [address] [place],  [place] [address]  Chief complaint:   In for follow-up for chronic shortness of breath  HPI:  Patient with a history of moderate persistent asthma  No significant shortness of breath at rest, does have some shortness of breath with exertion  Never smoker  Did have COVID in December 2024 requiring courses of antibiotics  Has been using Advair HFA, tried Wixela at some point did not help  Was in the [place], no other pertinent occupational history, worked in Airline pilot No pets  Did describe some improvement in his symptoms when he was able to use Symbicort   Outpatient Encounter Medications as of 11/10/2023  Medication Sig   albuterol  (VENTOLIN  HFA) 108 (90 Base) MCG/ACT inhaler Inhale 2 puffs into the lungs every 6 (six) hours as needed for wheezing or shortness of breath.   Cyanocobalamin  (VITAMIN B-12) 1000 MCG/15ML LIQD Take 1,000 mcg by mouth daily.   fluticasone -salmeterol (ADVAIR HFA) 230-21 MCG/ACT inhaler Inhale 2 puffs into the lungs 2 (two) times daily.   Spacer/Aero-Holding Chambers (AEROCHAMBER MV) inhaler Use as instructed   doxycycline  (VIBRAMYCIN ) 100 MG capsule Take one cap PO Q12hr with food. (Patient not taking: Reported on 11/10/2023)   fluticasone -salmeterol (WIXELA INHUB) 250-50 MCG/ACT AEPB Inhale 1 puff into the lungs in the morning and at bedtime. (Patient not taking: Reported on 11/10/2023)   guaiFENesin -codeine  100-10 MG/5ML syrup Take 5mL PO HS PRN cough (Patient not taking: Reported on 11/10/2023)   HYDROcodone -acetaminophen  (NORCO/VICODIN) 5-325 MG tablet Take 1 tablet by mouth every 6 (six) hours as needed. (Patient not taking: Reported on 11/10/2023)   ibuprofen (ADVIL) 600 MG tablet Take 600 mg by mouth every 6 (six) hours. (Patient not taking: Reported on 11/10/2023)   meloxicam   (MOBIC ) 15 MG tablet ONE TAB ORALLY EVERY 24 HOURS WITH A MEAL FOR 2 WEEKS, THEN ONCE EVERY 24 HOURS AS NEEDED FOR PAIN. (Patient not taking: Reported on 11/10/2023)   prednisoLONE acetate (PRED FORTE) 1 % ophthalmic suspension  (Patient not taking: Reported on 11/10/2023)   predniSONE  (DELTASONE ) 20 MG tablet Take one tab by mouth twice daily for 4 days, then one daily. Take with food. (Patient not taking: Reported on 11/10/2023)   predniSONE  (DELTASONE ) 20 MG tablet Take 1 tablet (20 mg total) by mouth daily with breakfast. (Patient not taking: Reported on 11/10/2023)   No facility-administered encounter medications on file as of 11/10/2023.    Allergies as of 11/10/2023 - Review Complete 11/10/2023  Allergen Reaction Noted   Sesame oil Itching and Swelling 05/13/2012   Tobramycin Itching and Swelling 05/13/2012    Past Medical History:  Diagnosis Date   Allergy    Anxiety    Arthritis    Asthma    At risk for sleep apnea    STOP-BANG = 4  SENT TO PCP 05-24-2013   Cataract 2023   Eczema    Edema of foot    Finger wound, simple, open    left middle index finger - stitches and dressing occured on 09/22/2016- followed by Dr Daws at [facility]    H/O hiatal hernia    History of kidney stones    HOH (hard of hearing)    Hyperlipidemia    Influenza A    Influenza B    symptoms started 05-16-2013/  dx 05-17-2013 by pcp   Left ankle injury    twisted left ankle    Left ureteral calculus    Pneumonia    hx of    Rash    right leg- calf -    Wears dentures    bottom    Past Surgical History:  Procedure Laterality Date   ANTERIOR CERVICAL DECOMP/DISCECTOMY FUSION  01/27/2007   C6  --- C7   COLONOSCOPY  08/25/2016   TA   CYSTOSCOPY WITH URETEROSCOPY Left 05/26/2013   Procedure: CYSTOSCOPY WITH URETEROSCOPY, URETHERAL DILATATION,LEFT RETROGRADE, LASER LITHOTRIPSY, STENT PLACMENT, LEFT URETEROSCOPY;  Surgeon: Arlena LILLETTE Gal, MD;  Location: [facility];   Service: Urology;  Laterality: Left;   FINGER FRACTURE SURGERY  AS CHILD   GANGLION CYST EXCISION Left 2023   HOLMIUM LASER APPLICATION Left 05/26/2013   Procedure: HOLMIUM LASER APPLICATION;  Surgeon: Arlena LILLETTE Gal, MD;  Location: [facility];  Service: Urology;  Laterality: Left;   IR RADIOLOGIST EVAL & MGMT  03/12/2023   JOINT REPLACEMENT  2922   LIPOMA EXCISION  05/17/2012   Procedure: EXCISION LIPOMA;  Surgeon: Camellia CHRISTELLA Blush, MD,FACS;  Location: [facility];  Service: General;  Laterality: Left;  incisional biopsy of left shoulder soft tissue mass   NASAL FRACTURE SURGERY  AS CHILD   ORIF ULNAR FRACTURE Right AS CHILD   ROTATOR CUFF REPAIR Right    right shoulder June2018 by [facility] Tanda A Gioffre   TOTAL KNEE ARTHROPLASTY Right 12/05/2018   Procedure: LEFT ARTHROSCOPY, CHONDROPLASTY;  Surgeon: Melodi Lerner, MD;  Location: [facility];  Service: Orthopedics;  Laterality: Right;    TOTAL KNEE REVISION Right 12/11/2020   Procedure: Right knee polyethylene revision;  Surgeon: Melodi Lerner, MD;  Location: [facility];  Service: Orthopedics;  Laterality: Right;    Family History  Problem Relation Age of Onset   Arthritis Other    Hypertension Other    Osteoporosis Other    Heart disease Other    Lung disease Other    Prostate cancer Father    Hearing loss Father    Heart disease Sister    Asthma Mother    Colon cancer Neg Hx    Colon polyps Neg Hx    Esophageal cancer Neg Hx    Kidney disease Neg Hx    Rectal cancer Neg Hx    Stomach cancer Neg Hx     Social History   Socioeconomic History   Marital status: Married    Spouse name: Sneijder Bernards   Number of children: 2   Years of education: 13   Highest education level: Some college, no degree  Occupational History   Occupation: Nurse, learning disability   Occupation: Retired  Tobacco Use   Smoking status: Never    Passive exposure: Never   Smokeless tobacco: Never   Vaping Use   Vaping status: Never Used  Substance and Sexual Activity   Alcohol use: Yes    Alcohol/week: 21.0 standard drinks of alcohol    Types: 21 Cans of beer per week    Comment: beer- 0-4 beers day sometimes   Drug use: Never   Sexual activity: Not Currently    Partners: Female  Other Topics Concern   Not on file  Social History Narrative   Lives with his wife. He has two children. He enjoys wood working, Engineer, water.   Social Drivers of Corporate investment banker Strain: Low Risk  (  12/01/2022)   Overall Financial Resource Strain (CARDIA)    Difficulty of Paying Living Expenses: Not hard at all  Food Insecurity: No Food Insecurity (12/01/2022)   Hunger Vital Sign    Worried About Running Out of Food in the Last Year: Never true    Ran Out of Food in the Last Year: Never true  Transportation Needs: No Transportation Needs (12/01/2022)   PRAPARE - Administrator, Civil Service (Medical): No    Lack of Transportation (Non-Medical): No  Physical Activity: Insufficiently Active (12/01/2022)   Exercise Vital Sign    Days of Exercise per Week: 4 days    Minutes of Exercise per Session: 20 min  Stress: No Stress Concern Present (12/01/2022)   [employer] of Occupational Health - Occupational Stress Questionnaire    Feeling of Stress : Not at all  Social Connections: Moderately Integrated (12/01/2022)   Social Connection and Isolation Panel    Frequency of Communication with Friends and Family: More than three times a week    Frequency of Social Gatherings with Friends and Family: Twice a week    Attends Religious Services: More than 4 times per year    Active Member of [employer] or Organizations: No    Attends Banker Meetings: Never    Marital Status: Married  Catering manager Violence: Not At Risk (04/19/2023)   Received from [facility]   HITS    Over the last 12 months how often did your partner physically hurt you?: Never    Over the last 12  months how often did your partner insult you or talk down to you?: Never    Over the last 12 months how often did your partner threaten you with physical harm?: Never    Over the last 12 months how often did your partner scream or curse at you?: Never    Review of Systems  Respiratory:  Positive for shortness of breath.     Vitals:   11/10/23 0932 11/10/23 0933  BP:  133/74  Pulse: (!) 59 (!) 59  SpO2: 97% 97%     Physical Exam Constitutional:      Appearance: Normal appearance.  HENT:     Head: Normocephalic.     Nose: Nose normal.     Mouth/Throat:     Mouth: Mucous membranes are moist.  Eyes:     General: No scleral icterus. Cardiovascular:     Rate and Rhythm: Normal rate and regular rhythm.     Heart sounds: No murmur heard.    No friction rub.  Pulmonary:     Effort: No respiratory distress.     Breath sounds: No stridor. No wheezing or rhonchi.  Neurological:     Mental Status: He is alert.  Psychiatric:        Mood and Affect: Mood normal.    Data Reviewed: Reviewed Dr. Quita notes  FeNo during last office visit was about 78  Pulmonary function test reviewed-10/25/2023-mild obstructive disease with significant bronchodilator response  Assessment:  Moderate persistent asthma - Symptoms appear controlled at present  Shortness of breath on exertion - Related to his asthma  Overall appears to be stable  Plan/Recommendations: Will place prescription for Advair HFA  Albuterol  use as needed, will place refills  Encouraged to call with significant concerns  Follow-up in 6 months  Jennet Epley MD [facility] 11/10/2023, 9:37 AM  CC: Alvia Bring, DO

## 2023-12-09 ENCOUNTER — Ambulatory Visit (INDEPENDENT_AMBULATORY_CARE_PROVIDER_SITE_OTHER)

## 2023-12-09 VITALS — BP 135/70 | HR 60 | Ht 70.0 in | Wt 218.0 lb

## 2023-12-09 DIAGNOSIS — Z0000 Encounter for general adult medical examination without abnormal findings: Secondary | ICD-10-CM | POA: Diagnosis not present

## 2023-12-09 NOTE — Patient Instructions (Signed)
  [name] , Thank you for taking time to come for your Medicare Wellness Visit. I appreciate your ongoing commitment to your health goals. Please review the following plan we discussed and let me know if I can assist you in the future.   These are the goals we discussed:  Goals       Patient Stated (pt-stated)      Patient stated that he would like to loose 20 lbs.      Patient Stated      Patient states he would like to continue a healthy lifestyle.       Weight (lb) < 225 lb (102.1 kg)      Cutting back on portions  Change happens slowly         This is a list of the screening recommended for you and due dates:  Health Maintenance  Topic Date Due   Zoster (Shingles) Vaccine (1 of 2) Never done   COVID-19 Vaccine (6 - 2024-25 season) 12/27/2022   Flu Shot  11/26/2023   Medicare Annual Wellness Visit  12/08/2024   DTaP/Tdap/Td vaccine (5 - Td or Tdap) 09/23/2026   Pneumococcal Vaccine for age over 6  Completed   Hepatitis C Screening  Completed   HPV Vaccine  Aged Out   Meningitis B Vaccine  Aged Out   Colon Cancer Screening  Discontinued

## 2023-12-09 NOTE — Progress Notes (Signed)
 Subjective:   [name] is a 73 y.o. male who presents for Medicare Annual/Subsequent preventive examination.  Visit Complete: In person  Patient Medicare AWV questionnaire was completed by the patient on n/a; I have confirmed that all information answered by patient is correct and no changes since this date.  Cardiac Risk Factors include: advanced age (>83men, >80 women);male gender;obesity (BMI >30kg/m2);family history of premature cardiovascular disease     Objective:    Today's Vitals   12/09/23 0858  BP: 135/70  Pulse: 60  SpO2: 96%  Weight: 218 lb (98.9 kg)  Height: 5' 10 (1.778 m)   Body mass index is 31.28 kg/m.     12/09/2023    9:20 AM 02/15/2023    3:37 PM 12/01/2022    9:19 AM 04/09/2022    3:12 PM 12/11/2020    6:36 PM 11/29/2020    8:14 AM 05/16/2019    2:30 PM  Advanced Directives  Does Patient Have a Medical Advance Directive? Yes Yes Yes No No No Yes  Type of Estate agent of [place];Living will Healthcare Power of [place];Living will Living will    Healthcare Power of [place];Living will  Does patient want to make changes to medical advance directive? No - Patient declined  No - Patient declined    No - Patient declined  Copy of Healthcare Power of Attorney in Chart? No - copy requested No - copy requested     No - copy requested  Would patient like information on creating a medical advance directive?    No - Patient declined No - Patient declined      Current Medications (verified) Outpatient Encounter Medications as of 12/09/2023  Medication Sig   albuterol (VENTOLIN HFA) 108 (90 Base) MCG/ACT inhaler Inhale 2 puffs into the lungs every 6 (six) hours as needed for wheezing or shortness of breath.   fluticasone-salmeterol (ADVAIR HFA) 230-21 MCG/ACT inhaler Inhale 2 puffs into the lungs 2 (two) times daily.   Cyanocobalamin (VITAMIN B-12) 1000 MCG/15ML LIQD Take 1,000 mcg by mouth daily. (Patient not taking: Reported on  12/09/2023)   [DISCONTINUED] doxycycline (VIBRAMYCIN) 100 MG capsule Take one cap PO Q12hr with food. (Patient not taking: Reported on 11/10/2023)   [DISCONTINUED] fluticasone-salmeterol (WIXELA INHUB) 250-50 MCG/ACT AEPB Inhale 1 puff into the lungs in the morning and at bedtime. (Patient not taking: Reported on 11/10/2023)   [DISCONTINUED] guaiFENesin-codeine 100-10 MG/5ML syrup Take 5mL PO HS PRN cough (Patient not taking: Reported on 11/10/2023)   [DISCONTINUED] HYDROcodone-acetaminophen (NORCO/VICODIN) 5-325 MG tablet Take 1 tablet by mouth every 6 (six) hours as needed. (Patient not taking: Reported on 11/10/2023)   [DISCONTINUED] ibuprofen (ADVIL) 600 MG tablet Take 600 mg by mouth every 6 (six) hours. (Patient not taking: Reported on 11/10/2023)   [DISCONTINUED] meloxicam (MOBIC) 15 MG tablet ONE TAB ORALLY EVERY 24 HOURS WITH A MEAL FOR 2 WEEKS, THEN ONCE EVERY 24 HOURS AS NEEDED FOR PAIN. (Patient not taking: Reported on 11/10/2023)   [DISCONTINUED] prednisoLONE acetate (PRED FORTE) 1 % ophthalmic suspension  (Patient not taking: Reported on 11/10/2023)   [DISCONTINUED] predniSONE (DELTASONE) 20 MG tablet Take one tab by mouth twice daily for 4 days, then one daily. Take with food. (Patient not taking: Reported on 11/10/2023)   [DISCONTINUED] predniSONE (DELTASONE) 20 MG tablet Take 1 tablet (20 mg total) by mouth daily with breakfast. (Patient not taking: Reported on 11/10/2023)   [DISCONTINUED] Spacer/Aero-Holding Chambers (AEROCHAMBER MV) inhaler Use as instructed   No facility-administered encounter  medications on file as of 12/09/2023.    Allergies (verified) Sesame oil and Tobramycin   History: Past Medical History:  Diagnosis Date   Allergy    Anxiety    Arthritis    Asthma    At risk for sleep apnea    STOP-BANG = 4  SENT TO PCP 05-24-2013   Cataract 2023   Eczema    Edema of foot    Finger wound, simple, open    left middle index finger - stitches and dressing occured on  09/22/2016- followed [facility] Dr Daws [facility]    H/O hiatal hernia    History of kidney stones    HOH (hard of hearing)    Hyperlipidemia    Influenza A    Influenza B    symptoms started 05-16-2013/  dx 05-17-2013 by pcp   Left ankle injury    twisted left ankle    Left ureteral calculus    Pneumonia    hx of    Rash    right leg- calf -    Wears dentures    bottom   Past Surgical History:  Procedure Laterality Date   ANTERIOR CERVICAL DECOMP/DISCECTOMY FUSION  01/27/2007   C6  --- C7   COLONOSCOPY  08/25/2016   TA   CYSTOSCOPY WITH URETEROSCOPY Left 05/26/2013   Procedure: CYSTOSCOPY WITH URETEROSCOPY, URETHERAL DILATATION,LEFT RETROGRADE, LASER LITHOTRIPSY, STENT PLACMENT, LEFT URETEROSCOPY;  Surgeon: Arlena LILLETTE Gal, MD;  Location: [facility];  Service: Urology;  Laterality: Left;   FINGER FRACTURE SURGERY  AS CHILD   GANGLION CYST EXCISION Left 2023   HOLMIUM LASER APPLICATION Left 05/26/2013   Procedure: HOLMIUM LASER APPLICATION;  Surgeon: Arlena LILLETTE Gal, MD;  Location: [facility];  Service: Urology;  Laterality: Left;   IR RADIOLOGIST EVAL & MGMT  03/12/2023   JOINT REPLACEMENT  2922   LIPOMA EXCISION  05/17/2012   Procedure: EXCISION LIPOMA;  Surgeon: Camellia CHRISTELLA Blush, MD,FACS;  Location: [facility];  Service: General;  Laterality: Left;  incisional biopsy of left shoulder soft tissue mass   NASAL FRACTURE SURGERY  AS CHILD   ORIF ULNAR FRACTURE Right AS CHILD   ROTATOR CUFF REPAIR Right    right shoulder June2018 by [facility] Tanda A Gioffre   TOTAL KNEE ARTHROPLASTY Right 12/05/2018   Procedure: LEFT ARTHROSCOPY, CHONDROPLASTY;  Surgeon: Melodi Lerner, MD;  Location: [facility];  Service: Orthopedics;  Laterality: Right;    TOTAL KNEE REVISION Right 12/11/2020   Procedure: Right knee polyethylene revision;  Surgeon: Melodi Lerner, MD;  Location: [facility];  Service: Orthopedics;  Laterality:  Right;   Family History  Problem Relation Age of Onset   Arthritis Other    Hypertension Other    Osteoporosis Other    Heart disease Other    Lung disease Other    Prostate cancer Father    Hearing loss Father    Heart disease Sister    Asthma Mother    Colon cancer Neg Hx    Colon polyps Neg Hx    Esophageal cancer Neg Hx    Kidney disease Neg Hx    Rectal cancer Neg Hx    Stomach cancer Neg Hx    Social History   Socioeconomic History   Marital status: Married    Spouse name: Blandon Offerdahl   Number of children: 2   Years of education: 13   Highest education level: Some college, no degree  Occupational  History   Occupation: Nurse, learning disability   Occupation: Retired  Tobacco Use   Smoking status: Never    Passive exposure: Never   Smokeless tobacco: Never  Vaping Use   Vaping status: Never Used  Substance and Sexual Activity   Alcohol use: Yes    Alcohol/week: 21.0 standard drinks of alcohol    Types: 21 Cans of beer per week    Comment: beer- 0-4 beers day sometimes   Drug use: Never   Sexual activity: Not Currently    Partners: Female  Other Topics Concern   Not on file  Social History Narrative   Lives with his wife. He has two children. He enjoys wood working, Engineer, water.   Social Drivers of Corporate investment banker Strain: Low Risk  (12/01/2022)   Overall Financial Resource Strain (CARDIA)    Difficulty of Paying Living Expenses: Not hard at all  Food Insecurity: No Food Insecurity (12/09/2023)   Hunger Vital Sign    Worried About Running Out of Food in the Last Year: Never true    Ran Out of Food in the Last Year: Never true  Transportation Needs: Unknown (12/09/2023)   PRAPARE - Administrator, Civil Service (Medical): No    Lack of Transportation (Non-Medical): Not on file  Physical Activity: Sufficiently Active (12/09/2023)   Exercise Vital Sign    Days of Exercise per Week: 7 days    Minutes of Exercise per Session: 60  min  Stress: No Stress Concern Present (12/09/2023)   [employer] of Occupational Health - Occupational Stress Questionnaire    Feeling of Stress: Not at all  Social Connections: Socially Integrated (12/09/2023)   Social Connection and Isolation Panel    Frequency of Communication with Friends and Family: More than three times a week    Frequency of Social Gatherings with Friends and Family: More than three times a week    Attends Religious Services: More than 4 times per year    Active Member of [employer] or Organizations: Yes    Attends Engineer, structural: More than 4 times per year    Marital Status: Married    Tobacco Counseling Counseling given: Not Answered   Clinical Intake:  Pre-visit preparation completed: Yes  Pain : No/denies pain     BMI - recorded: 31.28 Nutritional Status: BMI > 30  Obese Nutritional Risks: None Diabetes: No  How often do you need to have someone help you when you read instructions, pamphlets, or other written materials from your doctor or pharmacy?: 1 - Never What is the last grade level you completed in school?: 14  Interpreter Needed?: No      Activities of Daily Living    12/09/2023    9:05 AM  In your present state of health, do you have any difficulty performing the following activities:  Hearing? 1  Comment Hearing aids  Vision? 0  Difficulty concentrating or making decisions? 0  Walking or climbing stairs? 1  Dressing or bathing? 0  Doing errands, shopping? 0  Preparing Food and eating ? N  Using the Toilet? N  In the past six months, have you accidently leaked urine? Y  Do you have problems with loss of bowel control? N  Managing your Medications? N  Managing your Finances? N  Housekeeping or managing your Housekeeping? N    Patient Care Team: Alvia Bring, DO as PCP - General (Family Medicine) Cleotilde Elspeth CROME, OD as Consulting Physician (Optometry)  Melodi Lerner, MD as Consulting Physician  (Orthopedic Surgery) Neda Jennet LABOR, MD as Consulting Physician (Pulmonary Disease)  Indicate any recent Medical Services you may have received from other than [facility] providers in the past year (date may be approximate).     Assessment:   This is a routine wellness examination for [name].  Hearing/Vision screen No results found.   Goals Addressed             This Visit's Progress    Patient Stated       Patient states he would like to continue a healthy lifestyle.        Depression Screen    12/09/2023    9:19 AM 02/01/2023    3:13 PM 12/01/2022    9:17 AM 04/09/2022    3:13 PM 11/27/2020   10:51 AM 05/16/2019    2:43 PM 11/18/2018    1:48 PM  PHQ 2/9 Scores  PHQ - 2 Score 0 0 0 0 0 0 0  PHQ- 9 Score    3   0    Fall Risk    12/09/2023    9:20 AM 02/01/2023    3:13 PM 12/01/2022    9:17 AM 11/15/2022    9:42 PM 09/09/2022    3:41 PM  Fall Risk   Falls in the past year? 0 0 0 1 0  Number falls in past yr: 0 0 0 0 0  Injury with Fall? 0 0 0 0 0  Risk for fall due to : No Fall Risks No Fall Risks No Fall Risks  No Fall Risks  Follow up Falls evaluation completed Falls evaluation completed Falls evaluation completed  Falls evaluation completed    MEDICARE RISK AT HOME: Medicare Risk at Home Any stairs in or around the home?: Yes If so, are there any without handrails?: Yes Home free of loose throw rugs in walkways, pet beds, electrical cords, etc?: Yes Adequate lighting in your home to reduce risk of falls?: Yes Life alert?: No Use of a cane, walker or w/c?: No Grab bars in the bathroom?: Yes Shower chair or bench in shower?: Yes Elevated toilet seat or a handicapped toilet?: Yes  TIMED UP AND GO:  Was the test performed?  Yes  Length of time to ambulate 10 feet: 8 sec Gait steady and fast without use of assistive device    Cognitive Function:    09/01/2017    8:39 AM  MMSE - Mini Mental State Exam  Not completed: --        12/09/2023    9:22 AM 12/01/2022     9:24 AM 05/16/2019    2:35 PM  6CIT Screen  What Year? 4 points 0 points 0 points  What month? 0 points 0 points 0 points  What time? 3 points 0 points 0 points  Count back from 20 0 points 0 points 0 points  Months in reverse 0 points 0 points 0 points  Repeat phrase 8 points 4 points 0 points  Total Score 15 points 4 points 0 points    Immunizations Immunization History  Administered Date(s) Administered   Fluad Quad(high Dose 65+) 01/23/2020, 02/25/2022, 03/16/2022   Fluad Trivalent(High Dose 65+) 02/01/2023   Influenza Split 02/16/2013   Influenza Whole 01/23/2009, 01/16/2010   Influenza, High Dose Seasonal PF 02/25/2015, 03/10/2016, 02/18/2017, 02/08/2018, 03/07/2019, 02/20/2021   Influenza,inj,Quad PF,6+ Mos 02/06/2014   Moderna Sars-Covid-2 Vaccination 05/26/2019, 06/26/2019   PFIZER Comirnaty(Gray Top)Covid-19 Tri-Sucrose Vaccine 11/16/2020   PFIZER(Purple  Top)SARS-COV-2 Vaccination 03/18/2020, 11/16/2020   Pneumococcal Conjugate-13 02/06/2014   Pneumococcal Polysaccharide-23 03/27/1998, 04/14/2007, 12/08/2007, 03/10/2016   Pneumococcal-Unspecified 02/25/2021   Td 04/27/2004   Td (Adult), 2 Lf Tetanus Toxid, Preservative Free 04/27/2004   Tdap 02/06/2014, 09/22/2016    TDAP status: Up to date  Flu Vaccine status: Up to date  Pneumococcal vaccine status: Up to date  Covid-19 vaccine status: Completed vaccines  Qualifies for Shingles Vaccine? Yes   Zostavax completed Yes   Shingrix Completed?: No.    Education has been provided regarding the importance of this vaccine. Patient has been advised to call insurance company to determine out of pocket expense if they have not yet received this vaccine. Advised may also receive vaccine at local pharmacy or Health Dept. Verbalized acceptance and understanding.  Screening Tests Health Maintenance  Topic Date Due   Zoster Vaccines- Shingrix (1 of 2) Never done   COVID-19 Vaccine (6 - 2024-25 season) 12/27/2022    INFLUENZA VACCINE  11/26/2023   Medicare Annual Wellness (AWV)  12/08/2024   DTaP/Tdap/Td (5 - Td or Tdap) 09/23/2026   Pneumococcal Vaccine: 50+ Years  Completed   Hepatitis C Screening  Completed   HPV VACCINES  Aged Out   Meningococcal B Vaccine  Aged Out   Colonoscopy  Discontinued    Health Maintenance  Health Maintenance Due  Topic Date Due   Zoster Vaccines- Shingrix (1 of 2) Never done   COVID-19 Vaccine (6 - 2024-25 season) 12/27/2022   INFLUENZA VACCINE  11/26/2023    Colorectal cancer screening: Type of screening: Colonoscopy. Completed 05/12/2022. Repeat every 10 years  Lung Cancer Screening: (Low Dose CT Chest recommended if Age 11-80 years, 20 pack-year currently smoking OR have quit w/in 15years.) does not qualify.   Lung Cancer Screening Referral: n/a  Additional Screening:  Hepatitis C Screening: does qualify; Completed 09/17/2022  Vision Screening: Recommended annual ophthalmology exams for early detection of glaucoma and other disorders of the eye. Is the patient up to date with their annual eye exam?  Yes  Who is the provider or what is the name of the office in which the patient attends annual eye exams? Dr Cleotilde If pt is not established with a provider, would they like to be referred to a provider to establish care? N/a.   Dental Screening: Recommended annual dental exams for proper oral hygiene   Community Resource Referral / Chronic Care Management: CRR required this visit?  No   CCM required this visit?  No     Plan:     I have personally reviewed and noted the following in the patient's chart:   Medical and social history Use of alcohol, tobacco or illicit drugs  Current medications and supplements including opioid prescriptions. Patient is not currently taking opioid prescriptions. Functional ability and status Nutritional status Physical activity Advanced directives List of other physicians Hospitalizations # 0, surgeries # 1, and ER  # 1 visits in previous 12 months Vitals Screenings to include cognitive, depression, and falls Referrals and appointments  In addition, I have reviewed and discussed with patient certain preventive protocols, quality metrics, and best practice recommendations. A written personalized care plan for preventive services as well as general preventive health recommendations were provided to patient.     Bonny Jon Mayor, CMA   12/09/2023   After Visit Summary: (In Person-Printed) AVS printed and given to the patient  Nurse Notes:   [name] is a 73 y.o. male patient of Alvia Bring, DO  who had a [employer] Visit today. [name] is Retired and lives with their spouse. He has 2 children. He reports that he is socially active and does interact with friends/family regularly. He is moderately physically active and enjoys wood working, Engineer, water.

## 2023-12-15 DIAGNOSIS — B351 Tinea unguium: Secondary | ICD-10-CM | POA: Diagnosis not present

## 2023-12-28 ENCOUNTER — Encounter: Payer: Self-pay | Admitting: Sports Medicine

## 2024-01-07 ENCOUNTER — Other Ambulatory Visit: Payer: Self-pay | Admitting: Pulmonary Disease

## 2024-01-07 DIAGNOSIS — R638 Other symptoms and signs concerning food and fluid intake: Secondary | ICD-10-CM | POA: Diagnosis not present

## 2024-01-07 DIAGNOSIS — R404 Transient alteration of awareness: Secondary | ICD-10-CM | POA: Diagnosis not present

## 2024-01-07 DIAGNOSIS — R5381 Other malaise: Secondary | ICD-10-CM | POA: Diagnosis not present

## 2024-01-07 DIAGNOSIS — R918 Other nonspecific abnormal finding of lung field: Secondary | ICD-10-CM | POA: Diagnosis not present

## 2024-01-07 DIAGNOSIS — T679XXA Effect of heat and light, unspecified, initial encounter: Secondary | ICD-10-CM | POA: Diagnosis not present

## 2024-01-07 DIAGNOSIS — R55 Syncope and collapse: Secondary | ICD-10-CM | POA: Diagnosis not present

## 2024-01-11 ENCOUNTER — Other Ambulatory Visit: Payer: Self-pay | Admitting: Pulmonary Disease

## 2024-01-11 ENCOUNTER — Other Ambulatory Visit (HOSPITAL_COMMUNITY): Payer: Self-pay

## 2024-01-11 MED ORDER — BUDESONIDE-FORMOTEROL FUMARATE 160-4.5 MCG/ACT IN AERO: 2.0000 | INHALATION_SPRAY | Freq: Two times a day (BID) | RESPIRATORY_TRACT | 6 refills | Status: DC

## 2024-01-12 ENCOUNTER — Other Ambulatory Visit: Payer: Self-pay | Admitting: Pulmonary Disease

## 2024-01-12 ENCOUNTER — Encounter: Payer: Self-pay | Admitting: Pulmonary Disease

## 2024-01-12 NOTE — Telephone Encounter (Signed)
 Hello Dr. Neda,  can we change Symbicort  to Breyna  10.3gm?  Please give the dose, sig and number of refills.  Thank you.

## 2024-01-26 ENCOUNTER — Encounter: Payer: Self-pay | Admitting: Family Medicine

## 2024-01-26 ENCOUNTER — Ambulatory Visit (INDEPENDENT_AMBULATORY_CARE_PROVIDER_SITE_OTHER): Admitting: Family Medicine

## 2024-01-26 VITALS — BP 124/72 | HR 60 | Ht 70.0 in | Wt 215.0 lb

## 2024-01-26 DIAGNOSIS — R062 Wheezing: Secondary | ICD-10-CM

## 2024-01-26 DIAGNOSIS — Z23 Encounter for immunization: Secondary | ICD-10-CM

## 2024-01-26 DIAGNOSIS — R55 Syncope and collapse: Secondary | ICD-10-CM | POA: Diagnosis not present

## 2024-01-26 DIAGNOSIS — Z87442 Personal history of urinary calculi: Secondary | ICD-10-CM | POA: Diagnosis not present

## 2024-01-26 DIAGNOSIS — J4540 Moderate persistent asthma, uncomplicated: Secondary | ICD-10-CM

## 2024-01-26 NOTE — Assessment & Plan Note (Signed)
 Likely related to hypotension from over exposure to sun and dehydration.  Encouraged to stay well-hydrated.  Can incorporate some electrolyte containing drinks in as well.  Red flags reviewed.  Update labs today.

## 2024-01-26 NOTE — Progress Notes (Signed)
 KHAMERON GRUENWALD - 73 y.o. male MRN [id number]  Date of birth: February 21, 1951  Subjective Chief Complaint  Patient presents with   Hospitalization Follow-up    HPI ZAHEER WAGEMAN is a 73 y.o. male here today for follow up of recent ED visit.    He was at the beach recently and had near syncopal episode.  He was at the beach recently and had been laying on the beach for a prolonged period in the sun.  He had also had a few beers that day.  When he got up he felt really wobbly and faint.  He tried moving over to the shade but did not really have a whole lot of improvement.  He has carried off the beach by lifeguards and EMS was contacted.  EMS noted that he was hypotensive with systolic in the 80s.  He was evaluated at the hospital and rehydrated with fluids.  His ED records are unavailable for review however since he had fairly normal labs, negative cardiac enzymes as well as normal chest x-ray.  He felt better after rehydration.  He has been working on staying hydrated since discharge.  He denies chest pain or dyspnea.  ROS:  A comprehensive ROS was completed and negative except as noted per HPI  Allergies  Allergen Reactions   Sesame Oil Itching and Swelling   Tobramycin Itching and Swelling    Used in an eye oint-reaction    Past Medical History:  Diagnosis Date   Allergy    Anxiety    Arthritis    Asthma    At risk for sleep apnea    STOP-BANG = 4  SENT TO PCP 05-24-2013   Cataract 2023   Eczema    Edema of foot    Finger wound, simple, open    left middle index finger - stitches and dressing occured on 09/22/2016- followed by Dr Daws at [facility]    H/O hiatal hernia    History of kidney stones    HOH (hard of hearing)    Hyperlipidemia    Influenza A    Influenza B    symptoms started 05-16-2013/  dx 05-17-2013 by pcp   Left ankle injury    twisted left ankle    Left ureteral calculus    Pneumonia    hx of    Rash    right leg- calf -    Wears dentures    bottom     Past Surgical History:  Procedure Laterality Date   ANTERIOR CERVICAL DECOMP/DISCECTOMY FUSION  01/27/2007   C6  --- C7   COLONOSCOPY  08/25/2016   TA   CYSTOSCOPY WITH URETEROSCOPY Left 05/26/2013   Procedure: CYSTOSCOPY WITH URETEROSCOPY, URETHERAL DILATATION,LEFT RETROGRADE, LASER LITHOTRIPSY, STENT PLACMENT, LEFT URETEROSCOPY;  Surgeon: Arlena LILLETTE Gal, MD;  Location: [facility];  Service: Urology;  Laterality: Left;   FINGER FRACTURE SURGERY  AS CHILD   GANGLION CYST EXCISION Left 2023   HOLMIUM LASER APPLICATION Left 05/26/2013   Procedure: HOLMIUM LASER APPLICATION;  Surgeon: Arlena LILLETTE Gal, MD;  Location: [facility];  Service: Urology;  Laterality: Left;   IR RADIOLOGIST EVAL & MGMT  03/12/2023   JOINT REPLACEMENT  2922   LIPOMA EXCISION  05/17/2012   Procedure: EXCISION LIPOMA;  Surgeon: Camellia CHRISTELLA Blush, MD,FACS;  Location: [facility];  Service: General;  Laterality: Left;  incisional biopsy of left shoulder soft tissue mass   NASAL FRACTURE SURGERY  AS CHILD  ORIF ULNAR FRACTURE Right AS CHILD   ROTATOR CUFF REPAIR Right    right shoulder June2018 by [facility] Tanda A Gioffre   TOTAL KNEE ARTHROPLASTY Right 12/05/2018   Procedure: LEFT ARTHROSCOPY, CHONDROPLASTY;  Surgeon: Melodi Lerner, MD;  Location: [facility];  Service: Orthopedics;  Laterality: Right;    TOTAL KNEE REVISION Right 12/11/2020   Procedure: Right knee polyethylene revision;  Surgeon: Melodi Lerner, MD;  Location: [facility];  Service: Orthopedics;  Laterality: Right;    Social History   Socioeconomic History   Marital status: Married    Spouse name: Melvern Ramone   Number of children: 2   Years of education: 13   Highest education level: Some college, no degree  Occupational History   Occupation: Nurse, learning disability   Occupation: Retired  Tobacco Use   Smoking status: Never    Passive exposure: Never   Smokeless  tobacco: Never  Vaping Use   Vaping status: Never Used  Substance and Sexual Activity   Alcohol use: Yes    Alcohol/week: 21.0 standard drinks of alcohol    Types: 21 Cans of beer per week    Comment: beer- 0-4 beers day sometimes   Drug use: Never   Sexual activity: Not Currently    Partners: Female  Other Topics Concern   Not on file  Social History Narrative   Lives with his wife. He has two children. He enjoys wood working, Engineer, water.   Social Drivers of Corporate investment banker Strain: Low Risk  (12/01/2022)   Overall Financial Resource Strain (CARDIA)    Difficulty of Paying Living Expenses: Not hard at all  Food Insecurity: No Food Insecurity (12/09/2023)   Hunger Vital Sign    Worried About Running Out of Food in the Last Year: Never true    Ran Out of Food in the Last Year: Never true  Transportation Needs: Unknown (12/09/2023)   PRAPARE - Administrator, Civil Service (Medical): No    Lack of Transportation (Non-Medical): Not on file  Physical Activity: Sufficiently Active (12/09/2023)   Exercise Vital Sign    Days of Exercise per Week: 7 days    Minutes of Exercise per Session: 60 min  Stress: No Stress Concern Present (12/09/2023)   [employer] of Occupational Health - Occupational Stress Questionnaire    Feeling of Stress: Not at all  Social Connections: Socially Integrated (12/09/2023)   Social Connection and Isolation Panel    Frequency of Communication with Friends and Family: More than three times a week    Frequency of Social Gatherings with Friends and Family: More than three times a week    Attends Religious Services: More than 4 times per year    Active Member of [employer] or Organizations: Yes    Attends Engineer, structural: More than 4 times per year    Marital Status: Married    Family History  Problem Relation Age of Onset   Arthritis Other    Hypertension Other    Osteoporosis Other    Heart disease Other    Lung  disease Other    Prostate cancer Father    Hearing loss Father    Heart disease Sister    Asthma Mother    Colon cancer Neg Hx    Colon polyps Neg Hx    Esophageal cancer Neg Hx    Kidney disease Neg Hx    Rectal cancer Neg Hx    Stomach cancer  Neg Hx     Health Maintenance  Topic Date Due   Zoster Vaccines- Shingrix (1 of 2) Never done   COVID-19 Vaccine (7 - 2024-25 season) 07/26/2024   Medicare Annual Wellness (AWV)  12/08/2024   DTaP/Tdap/Td (5 - Td or Tdap) 09/23/2026   Pneumococcal Vaccine: 50+ Years  Completed   Influenza Vaccine  Completed   Hepatitis C Screening  Completed   HPV VACCINES  Aged Out   Meningococcal B Vaccine  Aged Out   Colonoscopy  Discontinued     ----------------------------------------------------------------------------------------------------------------------------------------------------------------------------------------------------------------- Physical Exam BP 124/72 (BP Location: Left Arm, Patient Position: Sitting, Cuff Size: Normal)   Pulse 60   Ht 5' 10 (1.778 m)   Wt 215 lb (97.5 kg)   SpO2 97%   BMI 30.85 kg/m   Physical Exam Constitutional:      Appearance: Normal appearance.  Eyes:     General: No scleral icterus. Cardiovascular:     Rate and Rhythm: Normal rate and regular rhythm.  Pulmonary:     Effort: Pulmonary effort is normal.     Breath sounds: Normal breath sounds.  Neurological:     Mental Status: He is alert.  Psychiatric:        Mood and Affect: Mood normal.        Behavior: Behavior normal.     ------------------------------------------------------------------------------------------------------------------------------------------------------------------------------------------------------------------- Assessment and Plan  Near syncope Likely related to hypotension from over exposure to sun and dehydration.  Encouraged to stay well-hydrated.  Can incorporate some electrolyte containing drinks in as  well.  Red flags reviewed.  Update labs today.   No orders of the defined types were placed in this encounter.   No follow-ups on file.

## 2024-01-27 LAB — CBC WITH DIFFERENTIAL/PLATELET
Basophils Absolute: 0.1 x10E3/uL (ref 0.0–0.2)
Basos: 1 %
EOS (ABSOLUTE): 0.4 x10E3/uL (ref 0.0–0.4)
Eos: 6 %
Hematocrit: 47.5 % (ref 37.5–51.0)
Hemoglobin: 16 g/dL (ref 13.0–17.7)
Immature Grans (Abs): 0 x10E3/uL (ref 0.0–0.1)
Immature Granulocytes: 0 %
Lymphocytes Absolute: 1.6 x10E3/uL (ref 0.7–3.1)
Lymphs: 22 %
MCH: 32.5 pg (ref 26.6–33.0)
MCHC: 33.7 g/dL (ref 31.5–35.7)
MCV: 96 fL (ref 79–97)
Monocytes Absolute: 0.8 x10E3/uL (ref 0.1–0.9)
Monocytes: 10 %
Neutrophils Absolute: 4.3 x10E3/uL (ref 1.4–7.0)
Neutrophils: 61 %
Platelets: 211 x10E3/uL (ref 150–450)
RBC: 4.93 x10E6/uL (ref 4.14–5.80)
RDW: 12.8 % (ref 11.6–15.4)
WBC: 7.2 x10E3/uL (ref 3.4–10.8)

## 2024-01-27 LAB — CMP14+EGFR
ALT: 18 IU/L (ref 0–44)
AST: 18 IU/L (ref 0–40)
Albumin: 4.3 g/dL (ref 3.8–4.8)
Alkaline Phosphatase: 70 IU/L (ref 47–123)
BUN/Creatinine Ratio: 12 (ref 10–24)
BUN: 14 mg/dL (ref 8–27)
Bilirubin Total: 0.8 mg/dL (ref 0.0–1.2)
CO2: 20 mmol/L (ref 20–29)
Calcium: 9.4 mg/dL (ref 8.6–10.2)
Chloride: 104 mmol/L (ref 96–106)
Creatinine, Ser: 1.14 mg/dL (ref 0.76–1.27)
Globulin, Total: 2.2 g/dL (ref 1.5–4.5)
Glucose: 87 mg/dL (ref 70–99)
Potassium: 4.6 mmol/L (ref 3.5–5.2)
Sodium: 140 mmol/L (ref 134–144)
Total Protein: 6.5 g/dL (ref 6.0–8.5)
eGFR: 67 mL/min/1.73 (ref >59)

## 2024-02-11 ENCOUNTER — Ambulatory Visit: Payer: Self-pay | Admitting: Family Medicine

## 2024-04-13 ENCOUNTER — Ambulatory Visit: Admitting: Family Medicine

## 2024-04-13 ENCOUNTER — Encounter: Payer: Self-pay | Admitting: Family Medicine

## 2024-04-13 VITALS — BP 125/72 | HR 91 | Temp 98.8°F | Ht 70.0 in | Wt 219.0 lb

## 2024-04-13 DIAGNOSIS — J101 Influenza due to other identified influenza virus with other respiratory manifestations: Secondary | ICD-10-CM | POA: Diagnosis not present

## 2024-04-13 DIAGNOSIS — R6889 Other general symptoms and signs: Secondary | ICD-10-CM

## 2024-04-13 LAB — POC COVID19/FLU A&B COMBO
Covid Antigen, POC: NEGATIVE
Influenza A Antigen, POC: POSITIVE — AB
Influenza B Antigen, POC: NEGATIVE

## 2024-04-13 MED ORDER — HYDROCODONE BIT-HOMATROP MBR 5-1.5 MG/5ML PO SOLN: 5.0000 mL | Freq: Three times a day (TID) | ORAL | 0 refills | Status: AC | PRN

## 2024-04-13 NOTE — Patient Instructions (Signed)

## 2024-04-13 NOTE — Assessment & Plan Note (Signed)
 Point-of-care testing positive influenza A today.  Unfortunately he is out of the window for Tamiflu  to be very effective for him.  Recommend continued supportive care.  He is wheezing a little bit on exam so we will add a burst of steroids especially given his history of asthma.  Hycodan cough syrup as needed.  Red flags reviewed.  Contact clinic if having worsening symptoms.

## 2024-04-13 NOTE — Progress Notes (Signed)
 MARQ REBELLO - 73 y.o. male MRN [id number]  Date of birth: [birth year]  Subjective Chief Complaint  Patient presents with   Sinusitis    HPI [name] is a 73 y.o. male here today with complaint of cough, congestion, chills, body aches and fatigue.  Unsure if he has had fever.  Notices some wheezing at times with mild dyspnea. Symptoms started about 4 days ago.  He has tried robitussin and dayquil.   ROS:  A comprehensive ROS was completed and negative except as noted per HPI  Allergies[1]  Past Medical History:  Diagnosis Date   Allergy    Anxiety    Arthritis    Asthma    At risk for sleep apnea    STOP-BANG = 4  SENT TO PCP 05-24-2013   Cataract 2023   Eczema    Edema of foot    Finger wound, simple, open    left middle index finger - stitches and dressing occured on 09/22/2016- followed by Dr Daws at [facility]    H/O hiatal hernia    History of kidney stones    HOH (hard of hearing)    Hyperlipidemia    Influenza A    Influenza B    symptoms started 05-16-2013/  dx 05-17-2013 by pcp   Left ankle injury    twisted left ankle    Left ureteral calculus    Pneumonia    hx of    Rash    right leg- calf -    Wears dentures    bottom    Past Surgical History:  Procedure Laterality Date   ANTERIOR CERVICAL DECOMP/DISCECTOMY FUSION  01/27/2007   C6  --- C7   COLONOSCOPY  08/25/2016   TA   CYSTOSCOPY WITH URETEROSCOPY Left 05/26/2013   Procedure: CYSTOSCOPY WITH URETEROSCOPY, URETHERAL DILATATION,LEFT RETROGRADE, LASER LITHOTRIPSY, STENT PLACMENT, LEFT URETEROSCOPY;  Surgeon: Arlena LILLETTE Gal, MD;  Location: [facility];  Service: Urology;  Laterality: Left;   FINGER FRACTURE SURGERY  AS CHILD   GANGLION CYST EXCISION Left 2023   HOLMIUM LASER APPLICATION Left 05/26/2013   Procedure: HOLMIUM LASER APPLICATION;  Surgeon: Arlena LILLETTE Gal, MD;  Location: [facility];  Service: Urology;  Laterality: Left;   IR RADIOLOGIST  EVAL & MGMT  03/12/2023   JOINT REPLACEMENT  2922   LIPOMA EXCISION  05/17/2012   Procedure: EXCISION LIPOMA;  Surgeon: Camellia CHRISTELLA Blush, MD,FACS;  Location: [facility];  Service: General;  Laterality: Left;  incisional biopsy of left shoulder soft tissue mass   NASAL FRACTURE SURGERY  AS CHILD   ORIF ULNAR FRACTURE Right AS CHILD   ROTATOR CUFF REPAIR Right    right shoulder June2018 by [facility] Tanda A Gioffre   TOTAL KNEE ARTHROPLASTY Right 12/05/2018   Procedure: LEFT ARTHROSCOPY, CHONDROPLASTY;  Surgeon: Melodi Lerner, MD;  Location: [facility];  Service: Orthopedics;  Laterality: Right;    TOTAL KNEE REVISION Right 12/11/2020   Procedure: Right knee polyethylene revision;  Surgeon: Melodi Lerner, MD;  Location: [facility];  Service: Orthopedics;  Laterality: Right;    Social History   Socioeconomic History   Marital status: Married    Spouse name: Jakevious Hollister   Number of children: 2   Years of education: 13   Highest education level: Some college, no degree  Occupational History   Occupation: Nurse, Learning Disability   Occupation: Retired  Tobacco Use   Smoking status: Never  Passive exposure: Never   Smokeless tobacco: Never  Vaping Use   Vaping status: Never Used  Substance and Sexual Activity   Alcohol use: Yes    Alcohol/week: 21.0 standard drinks of alcohol    Types: 21 Cans of beer per week    Comment: beer- 0-4 beers day sometimes   Drug use: Never   Sexual activity: Not Currently    Partners: Female  Other Topics Concern   Not on file  Social History Narrative   Lives with his wife. He has two children. He enjoys wood working, engineer, water.   Social Drivers of Health   Tobacco Use: Low Risk (04/13/2024)   Patient History    Smoking Tobacco Use: Never    Smokeless Tobacco Use: Never    Passive Exposure: Never  Financial Resource Strain: Low Risk (12/01/2022)   Overall Financial Resource Strain (CARDIA)    Difficulty of  Paying Living Expenses: Not hard at all  Food Insecurity: No Food Insecurity (12/09/2023)   Epic    Worried About Programme Researcher, Broadcasting/film/video in the Last Year: Never true    Ran Out of Food in the Last Year: Never true  Transportation Needs: Unknown (12/09/2023)   Epic    Lack of Transportation (Medical): No    Lack of Transportation (Non-Medical): Not on file  Physical Activity: Sufficiently Active (12/09/2023)   Exercise Vital Sign    Days of Exercise per Week: 7 days    Minutes of Exercise per Session: 60 min  Stress: No Stress Concern Present (12/09/2023)   [employer] of Occupational Health - Occupational Stress Questionnaire    Feeling of Stress: Not at all  Social Connections: Socially Integrated (12/09/2023)   Social Connection and Isolation Panel    Frequency of Communication with Friends and Family: More than three times a week    Frequency of Social Gatherings with Friends and Family: More than three times a week    Attends Religious Services: More than 4 times per year    Active Member of Clubs or Organizations: Yes    Attends Banker Meetings: More than 4 times per year    Marital Status: Married  Depression (PHQ2-9): Low Risk (12/09/2023)   Depression (PHQ2-9)    PHQ-2 Score: 0  Alcohol Screen: Low Risk (12/09/2023)   Alcohol Screen    Last Alcohol Screening Score (AUDIT): 3  Housing: Low Risk (12/09/2023)   Epic    Unable to Pay for Housing in the Last Year: No    Number of Times Moved in the Last Year: 0    Homeless in the Last Year: No  Utilities: Not At Risk (12/09/2023)   Epic    Threatened with loss of utilities: No  Health Literacy: Adequate Health Literacy (12/09/2023)   B1300 Health Literacy    Frequency of need for help with medical instructions: Never    Family History  Problem Relation Age of Onset   Arthritis Other    Hypertension Other    Osteoporosis Other    Heart disease Other    Lung disease Other    Prostate cancer Father    Hearing  loss Father    Heart disease Sister    Asthma Mother    Colon cancer Neg Hx    Colon polyps Neg Hx    Esophageal cancer Neg Hx    Kidney disease Neg Hx    Rectal cancer Neg Hx    Stomach cancer Neg Hx  Health Maintenance  Topic Date Due   Zoster Vaccines- Shingrix (1 of 2) 07/12/2024 (Originally 04/25/1999)   COVID-19 Vaccine (6 - 2025-26 season) 07/26/2024   Medicare Annual Wellness (AWV)  12/08/2024   DTaP/Tdap/Td (5 - Td or Tdap) 09/23/2026   Pneumococcal Vaccine: 50+ Years  Completed   Influenza Vaccine  Completed   Hepatitis C Screening  Completed   Meningococcal B Vaccine  Aged Out   Colonoscopy  Discontinued     ----------------------------------------------------------------------------------------------------------------------------------------------------------------------------------------------------------------- Physical Exam BP 125/72   Pulse 91   Temp 98.8 F (37.1 C) (Oral)   Ht 5' 10 (1.778 m)   Wt 219 lb (99.3 kg)   SpO2 96%   BMI 31.42 kg/m   Physical Exam Constitutional:      Appearance: Normal appearance.  HENT:     Head: Normocephalic and atraumatic.  Eyes:     General: No scleral icterus. Cardiovascular:     Rate and Rhythm: Normal rate and regular rhythm.  Pulmonary:     Effort: Pulmonary effort is normal.     Breath sounds: Normal breath sounds.  Musculoskeletal:     Cervical back: Neck supple.  Neurological:     General: No focal deficit present.     Mental Status: He is alert.  Psychiatric:        Mood and Affect: Mood normal.        Behavior: Behavior normal.     ------------------------------------------------------------------------------------------------------------------------------------------------------------------------------------------------------------------- Assessment and Plan  Influenza A Point-of-care testing positive influenza A today.  Unfortunately he is out of the window for Tamiflu  to be very  effective for him.  Recommend continued supportive care.  He is wheezing a little bit on exam so we will add a burst of steroids especially given his history of asthma.  Hycodan cough syrup as needed.  Red flags reviewed.  Contact clinic if having worsening symptoms.   Meds ordered this encounter  Medications   predniSONE  (DELTASONE ) 20 MG tablet    Sig: Take 1 tablet (20 mg total) by mouth 2 (two) times daily with a meal for 5 days.    Dispense:  10 tablet    Refill:  0   HYDROcodone  bit-homatropine (HYCODAN) 5-1.5 MG/5ML syrup    Sig: Take 5 mLs by mouth every 8 (eight) hours as needed for cough.    Dispense:  120 mL    Refill:  0    No follow-ups on file.          [1]  Allergies Allergen Reactions   Sesame Oil Itching and Swelling   Tobramycin Itching and Swelling    Used in an eye oint-reaction

## 2024-04-16 NOTE — ED Provider Notes (Signed)
 " [facility]  ED Provider Note  [name] 73 y.o. male DOB: [birth year] MRN: [id number] History   Chief Complaint  Patient presents with   Flu Like Symptoms    Was dx with Flu on the 18. Presents due to worsening cough and SHOB.    The patient been brought to [facility] ED for reevaluation of shortness of breath and cough times approximate 4 days.  The patient been seen and evaluated on December 18 diagnosed with positive flu.  He was not placed on any medications.  He has had subjective fevers he reports primarily nonproductive cough no reports of abdominal pain nausea or vomiting no reports of difficulty with bowel or bladder function.   History provided by:  Patient and relative Flu Like Symptoms Presenting symptoms: cough   Presenting symptoms: no nausea and no vomiting        No past medical history on file.  No past surgical history on file.  Social History   Substance and Sexual Activity  Alcohol Use Yes   Comment: occ   Tobacco Use History[1] E-Cigarettes   Vaping Use     Start Date     Cartridges/Day     Quit Date     Social History   Substance and Sexual Activity  Drug Use No         Allergies[2]  Discharge Medication List as of 04/16/2024  9:33 AM     CONTINUE these medications which have NOT CHANGED   Details  albuterol  sulfate HFA (PROVENTIL ,VENTOLIN ,PROAIR ) 108 (90 Base) MCG/ACT inhaler Inhale two puffs into the lungs every 6 (six) hours as needed for Wheezing., Starting Tue 08/06/2020, Normal    COCONUT OIL PO Take by mouth., Historical Med    Cyanocobalamin  (VITAMIN B-12) 5000 MCG TBDP Take by mouth., Historical Med    fluconazole  (DIFLUCAN ) 150 mg tablet TAKE 1 TABLET BY MOUTH ONCE, Historical Med    fluticasone  propionate (FLONASE ) 50 mcg/actuation nasal spray by Nasal route., Historical Med    hydrocodone -chlorpheniramine ER (TUSSIONEX) 10-8 mg/5 mL SUER 12 hr suspension Take 5 mLs by mouth.,  Starting Mon 10/05/2016, Historical Med    ketoconazole  (NIZORAL ) 2 % cream APPLY TO AFFECTED AREA EVERY DAY, Historical Med    Multiple Vitamin (MULTIVITAMIN) capsule Take by mouth., Historical Med    NaCl 0.9% solution Inject 500 mLs into the vein., Starting Thu 09/03/2016, Historical Med    oxyCODONE -acetaminophen  (PERCOCET,ENDOCET) 5-325 mg per tablet Take one tablet by mouth every 4 (four) hours as needed for Pain., Starting Tue 09/22/2016, Print    predniSONE  (DELTASONE ) 20 mg tablet Take 2 tabs po daily x 5 days, Normal    SUPREP BOWEL PREP  KIT 17.5-3.13-1.6 GM/180ML TAKE 1 KIT BY MOUTH ONCE, Historical Med    traMADol  (ULTRAM ) 50 mg tablet TAKE 1 TABLET BY MOUTH EVERY 8 HOURS AS NEEDED FOR PAIN, Historical Med        Primary Survey  Primary Survey  Review of Systems   Review of Systems  Respiratory:  Positive for cough.   Gastrointestinal:  Negative for nausea and vomiting.  Neurological:  Positive for weakness.  All other systems reviewed and are negative.   Physical Exam   ED Triage Vitals [04/16/24 0212]  BP 109/64  Heart Rate 83  Resp 17  SpO2 93 %  Temp (!) 102.3 F (39.1 C)    Physical Exam  Nursing note and vitals reviewed. Constitutional: He appears well-developed and well-nourished. He appears older than stated  age. He does not appear distressed and does not appear ill.  Eyes: Conjunctivae are normal.  Neck: No JVD.  Cardiovascular: Normal rate and regular rhythm.  Pulmonary/Chest: Respiratory effort normal and breath sounds normal.  Abdominal: Soft. There is no abdominal tenderness.  Lymphadenopathy:    No cervical adenopathy.  Neurological: He is alert and oriented to person, place, and time.  Skin: Skin is dry.     ED Course   Lab results:   CBC AND DIFFERENTIAL - Abnormal      Result Value   WBC 12.0 (*)    RBC 4.43 (*)    HGB 14.6     HCT 40.5     MCV 91.4     MCH 33.0 (*)    MCHC 36.0     Plt Ct 184     RDW SD 42.6     MPV  10.5     NRBC% 0.0     Absolute NRBC Count 0.00     NEUTROPHIL % 86.2     LYMPHOCYTE % 4.2     MONOCYTE % 9.0     Eosinophil % 0.1     BASOPHIL % 0.2     IG% 0.3     ABSOLUTE NEUTROPHIL COUNT 10.35 (*)    ABSOLUTE LYMPHOCYTE COUNT 0.51 (*)    Absolute Monocyte Count 1.08 (*)    Absolute Eosinophil Count 0.01     Absolute Basophil Count 0.02     Absolute Immature Granulocyte Count 0.04 (*)   COMPREHENSIVE METABOLIC PANEL - Abnormal   Na 131 (*)    Potassium 3.8     Cl 96 (*)    CO2 23     AGAP 12     Glucose 130 (*)    BUN 20     Creatinine 1.16     Ca 8.6     ALK PHOS 58     T Bili 0.7     Total Protein 6.8     Alb 4.2     GLOBULIN 2.6     ALBUMIN/GLOBULIN RATIO 1.6     BUN/CREAT RATIO 17.2     ALT 23     AST 27     eGFR 66     Comment: Normal GFR (glomerular filtration rate) > 60 mL/min/1.73 meters squared, < 60 may include impaired kidney function. Calculation based on the Chronic Kidney Disease Epidemiology Collaboration (CK-EPI)equation refit without adjustment for race.  URINALYSIS ONLY - NO SYMPTOMS - Normal   Urine Color Yellow     Urine Clarity Clear     Urine Specific Gravity 1.005     Urine pH 6.0     Urine Protein - Dipstick Negative     Urine Glucose Negative     Urine Ketones Negative     Urine Bilirubin Negative     Urine Blood Negative     Urine Nitrite Negative     Urine Urobilinogen <2     Urine Leukocyte Esterase Negative     UA Microscopic No Micro      Imaging:   XR CHEST AP PORTABLE   Narrative:    EXAM: XR CHEST AP PORTABLE  INDICATION: Cough  COMPARISON: 04/19/2023  FINDINGS:  There is unchanged calcified pleural plaque in the left hemithorax There is no acute infiltrate, pleural effusion or pneumothorax.     Impression:    IMPRESSION: No acute abnormality.   Electronically Signed by: Sonny Livings, MD on 04/16/2024 2:33 AM  ECG: ECG Results   None                                                                         Pre-Sedation Procedures    Medical Decision Making Routine laboratory studies were obtained and were unremarkable urinalysis unremarkable chest x-ray clear.  The patient is given IV fluids.  He did have minor fever which was resolved with Tylenol  and ibuprofen.  At this point time he does not require any further workup and will be discharged home he was ambulatory in the emergency department and in no distress.  Recommended continue with Tylenol  and ibuprofen as needed for fevers and aches drink plenty fluids rest and use over-the-counter cough medications.  Otherwise they are to follow-up for reassessment with your primary care provider in 2 days.  The patient and spouse were agreeable to plan and discharged.  Amount and/or Complexity of Data Reviewed Labs: ordered.  Risk OTC drugs. Prescription drug management.          Provider Communication  Discharge Medication List as of 04/16/2024  9:33 AM      Discharge Medication List as of 04/16/2024  9:33 AM      Discharge Medication List as of 04/16/2024  9:33 AM      Clinical Impression Final diagnoses:  Influenza A    ED Disposition     ED Disposition  Discharge   Condition  Stable   Comment  --                 Follow-up Information     Velma FORBES Ku, DO.   Specialty: Family Medicine Contact information: [address] [place] [address] [phone]                  Electronically signed by:       [1] Social History Tobacco Use  Smoking Status Never  Smokeless Tobacco Never  [2] Allergies Allergen Reactions   Sesame Oil Itching, Swelling and Hives    And sesame seeds   Tobramycin Hives   Paul Frederick McGuire, MD 04/16/24 1526  "

## 2024-04-18 ENCOUNTER — Encounter: Payer: Self-pay | Admitting: Sports Medicine

## 2024-04-18 ENCOUNTER — Ambulatory Visit

## 2024-04-18 ENCOUNTER — Ambulatory Visit: Payer: Self-pay

## 2024-04-18 ENCOUNTER — Ambulatory Visit (INDEPENDENT_AMBULATORY_CARE_PROVIDER_SITE_OTHER): Admitting: Sports Medicine

## 2024-04-18 VITALS — BP 101/62 | HR 70 | Temp 98.0°F | Wt 215.0 lb

## 2024-04-18 DIAGNOSIS — R42 Dizziness and giddiness: Secondary | ICD-10-CM

## 2024-04-18 DIAGNOSIS — J0100 Acute maxillary sinusitis, unspecified: Secondary | ICD-10-CM | POA: Diagnosis not present

## 2024-04-18 DIAGNOSIS — R051 Acute cough: Secondary | ICD-10-CM

## 2024-04-18 DIAGNOSIS — R5383 Other fatigue: Secondary | ICD-10-CM | POA: Diagnosis not present

## 2024-04-18 DIAGNOSIS — R059 Cough, unspecified: Secondary | ICD-10-CM | POA: Diagnosis not present

## 2024-04-18 LAB — BASIC METABOLIC PANEL WITH GFR
BUN: 17 mg/dL (ref 6–23)
CO2: 27 meq/L (ref 19–32)
Calcium: 8.1 mg/dL — ABNORMAL LOW (ref 8.4–10.5)
Chloride: 102 meq/L (ref 96–112)
Creatinine, Ser: 1.02 mg/dL (ref 0.40–1.50)
GFR: 72.16 mL/min
Glucose, Bld: 93 mg/dL (ref 70–99)
Potassium: 4.1 meq/L (ref 3.5–5.1)
Sodium: 136 meq/L (ref 135–145)

## 2024-04-18 LAB — CBC WITH DIFFERENTIAL/PLATELET
Basophils Absolute: 0 K/uL (ref 0.0–0.1)
Basophils Relative: 0.3 % (ref 0.0–3.0)
Eosinophils Absolute: 0 K/uL (ref 0.0–0.7)
Eosinophils Relative: 0.4 % (ref 0.0–5.0)
HCT: 43.1 % (ref 39.0–52.0)
Hemoglobin: 14.7 g/dL (ref 13.0–17.0)
Lymphocytes Relative: 8.5 % — ABNORMAL LOW (ref 12.0–46.0)
Lymphs Abs: 0.9 K/uL (ref 0.7–4.0)
MCHC: 34.1 g/dL (ref 30.0–36.0)
MCV: 95.4 fl (ref 78.0–100.0)
Monocytes Absolute: 0.8 K/uL (ref 0.1–1.0)
Monocytes Relative: 6.8 % (ref 3.0–12.0)
Neutro Abs: 9.2 K/uL — ABNORMAL HIGH (ref 1.4–7.7)
Neutrophils Relative %: 84 % — ABNORMAL HIGH (ref 43.0–77.0)
Platelets: 175 K/uL (ref 150.0–400.0)
RBC: 4.52 Mil/uL (ref 4.22–5.81)
RDW: 13.8 % (ref 11.5–15.5)
WBC: 11 K/uL — ABNORMAL HIGH (ref 4.0–10.5)

## 2024-04-18 LAB — TSH: TSH: 2.31 u[IU]/mL (ref 0.35–5.50)

## 2024-04-18 MED ORDER — AMOXICILLIN-POT CLAVULANATE 500-125 MG PO TABS: 1.0000 | ORAL_TABLET | Freq: Three times a day (TID) | ORAL | 0 refills | Status: AC

## 2024-04-18 MED ORDER — BENZONATATE 200 MG PO CAPS: 200.0000 mg | ORAL_CAPSULE | Freq: Two times a day (BID) | ORAL | 0 refills | Status: DC | PRN

## 2024-04-18 NOTE — Progress Notes (Signed)
 "  Careteam: Patient Care Team: Alvia Bring, DO as PCP - General (Family Medicine) Cleotilde Elspeth CROME, OD as Consulting Physician (Optometry) Melodi Lerner, MD as Consulting Physician (Orthopedic Surgery) Neda Jennet LABOR, MD as Consulting Physician (Pulmonary Disease)  Allergies[1]  Chief Complaint  Patient presents with   Influenza    Pt tested positive for flu on 12/18 and is still sick. He is still having fatigue and is very jittery.    Discussed the use of AI scribe software for clinical note transcription with the patient, who gave verbal consent to proceed.  History of Present Illness  [name] is a 73 year old male with asthma who presents with persistent dizziness and cough following a flu infection. He is accompanied by [name], his caregiver.  He has been experiencing persistent cough following a flu infection that began on December 18th. He visited urgent care a few days after the onset of symptoms. Despite treatment, he continues to experience dizziness, a rattling cough with yellow phlegm, and shortness of breath upon exertion. He feels lightheaded, especially when standing or walking, and has been consuming a lot of fluids, including water and ginger ale.  He has a history of asthma and is on maintenance Advair twice daily, with a rescue inhaler used once or twice daily since becoming ill. He has been using Mucinex , Tylenol , and ibuprofen for symptom management, and hydrocodone  for cough. He has also been taking over-the-counter sinus medication and prednisone , which helped dry out his nasal passages.  No fever in the past few days, with the last recorded fever being on Sunday morning. He reports a raw throat from coughing, occasional nausea, and a decreased appetite since becoming ill. No pain with urination, diarrhea, or ear pain, but notes frequent urination without pain. He also reports sinus pressure without significant pain and no runny nose currently     Review of Systems:  Review of Systems  Constitutional:  Positive for malaise/fatigue. Negative for chills and fever.  HENT:  Negative for congestion, ear pain, sinus pain and sore throat.   Respiratory:  Positive for cough, sputum production, shortness of breath and wheezing.   Cardiovascular:  Negative for chest pain and palpitations.  Gastrointestinal:  Negative for abdominal pain, blood in stool, nausea and vomiting.  Genitourinary:  Negative for dysuria.  Neurological:  Positive for dizziness.   Negative unless indicated in HPI.   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   NOISE-INDUCED HEARING LOSS 12/17/2008   Dyshidrotic eczema 12/08/2007   NEPHROLITHIASIS, HX OF 11/10/2007   Allergic rhinitis 06/24/2007   Past Medical History:  Diagnosis Date   Allergy    Anxiety    Arthritis    Asthma    At risk for sleep apnea    STOP-BANG = 4  SENT TO PCP 05-24-2013   Cataract 2023   Eczema    Edema of foot    Finger wound, simple, open  left middle index finger - stitches and dressing occured on 09/22/2016- followed by Dr Daws at [facility]    H/O hiatal hernia    History of kidney stones    HOH (hard of hearing)    Hyperlipidemia    Influenza A    Influenza B    symptoms started 05-16-2013/  dx 05-17-2013 by pcp   Left ankle injury    twisted left ankle    Left ureteral calculus    Pneumonia    hx of     Rash    right leg- calf -    Wears dentures    bottom   Past Surgical History:  Procedure Laterality Date   ANTERIOR CERVICAL DECOMP/DISCECTOMY FUSION  01/27/2007   C6  --- C7   COLONOSCOPY  08/25/2016   TA   CYSTOSCOPY WITH URETEROSCOPY Left 05/26/2013   Procedure: CYSTOSCOPY WITH URETEROSCOPY, URETHERAL DILATATION,LEFT RETROGRADE, LASER LITHOTRIPSY, STENT PLACMENT, LEFT URETEROSCOPY;  Surgeon: Arlena LILLETTE Gal, MD;  Location: [facility];  Service: Urology;  Laterality: Left;   FINGER FRACTURE SURGERY  AS CHILD   GANGLION CYST EXCISION Left 2023   HOLMIUM LASER APPLICATION Left 05/26/2013   Procedure: HOLMIUM LASER APPLICATION;  Surgeon: Arlena LILLETTE Gal, MD;  Location: [facility];  Service: Urology;  Laterality: Left;   IR RADIOLOGIST EVAL & MGMT  03/12/2023   JOINT REPLACEMENT  2922   LIPOMA EXCISION  05/17/2012   Procedure: EXCISION LIPOMA;  Surgeon: Camellia CHRISTELLA Blush, MD,FACS;  Location: [facility];  Service: General;  Laterality: Left;  incisional biopsy of left shoulder soft tissue mass   NASAL FRACTURE SURGERY  AS CHILD   ORIF ULNAR FRACTURE Right AS CHILD   ROTATOR CUFF REPAIR Right    right shoulder June2018 by [facility] Tanda A Gioffre   TOTAL KNEE ARTHROPLASTY Right 12/05/2018   Procedure: LEFT ARTHROSCOPY, CHONDROPLASTY;  Surgeon: Melodi Lerner, MD;  Location: [facility];  Service: Orthopedics;  Laterality: Right;    TOTAL KNEE REVISION Right 12/11/2020   Procedure: Right knee polyethylene revision;  Surgeon: Melodi Lerner, MD;  Location: [facility];  Service: Orthopedics;  Laterality: Right;   Social History[2] Family History  Problem Relation Age of Onset   Arthritis Other    Hypertension Other    Osteoporosis Other    Heart disease Other    Lung disease Other    Prostate cancer Father    Hearing loss Father    Heart disease Sister    Asthma Mother    Colon cancer Neg Hx    Colon polyps Neg  Hx    Esophageal cancer Neg Hx    Kidney disease Neg Hx    Rectal cancer Neg Hx    Stomach cancer Neg Hx    Allergies[3]  Medications: Patient's Medications  New Prescriptions   AMOXICILLIN -CLAVULANATE (AUGMENTIN ) 500-125 MG TABLET    Take 1 tablet by mouth 3 (three) times daily.  Previous Medications   ALBUTEROL  (VENTOLIN  HFA) 108 (90 BASE) MCG/ACT INHALER    Inhale 2 puffs into the lungs every 6 (six) hours as needed for wheezing or shortness of breath.   BUDESONIDE -FORMOTEROL  (BREYNA ) 160-4.5 MCG/ACT INHALER    Inhale 2 puffs into the lungs every 12 (twelve) hours.   CYANOCOBALAMIN  (VITAMIN B-12) 1000 MCG/15ML LIQD    Take 1,000 mcg by mouth daily.   HYDROCODONE  BIT-HOMATROPINE (HYCODAN) 5-1.5 MG/5ML SYRUP    Take 5 mLs by mouth every 8 (eight) hours as needed  for cough.   PREDNISONE  (DELTASONE ) 20 MG TABLET    Take 1 tablet (20 mg total) by mouth 2 (two) times daily with a meal for 5 days.  Modified Medications   No medications on file  Discontinued Medications   No medications on file    Physical Exam: Vitals:   04/18/24 0926  BP: 101/62  Pulse: 70  Temp: 98 F (36.7 C)  TempSrc: Oral  SpO2: 97%  Weight: 215 lb (97.5 kg)   Body mass index is 30.85 kg/m. BP Readings from Last 3 Encounters:  04/18/24 101/62  04/13/24 125/72  01/26/24 124/72   Wt Readings from Last 3 Encounters:  04/18/24 215 lb (97.5 kg)  04/13/24 219 lb (99.3 kg)  01/26/24 215 lb (97.5 kg)    Physical Exam Constitutional:      Appearance: Normal appearance.  HENT:     Head: Normocephalic and atraumatic.     Right Ear: Tympanic membrane normal.     Left Ear: Tympanic membrane normal.     Mouth/Throat:     Pharynx: No posterior oropharyngeal erythema.     Comments: Bil maxillary sinusitis Cardiovascular:     Rate and Rhythm: Normal rate and regular rhythm.     Pulses: Normal pulses.     Heart sounds: Normal heart sounds.  Pulmonary:     Effort: No respiratory distress.     Breath  sounds: No stridor. No wheezing or rales.  Abdominal:     General: Bowel sounds are normal. There is no distension.     Palpations: Abdomen is soft.     Tenderness: There is no abdominal tenderness. There is no guarding.  Musculoskeletal:        General: No swelling.  Neurological:     Mental Status: He is alert. Mental status is at baseline.     Sensory: No sensory deficit.     Motor: No weakness.     Labs reviewed: Basic Metabolic Panel: Recent Labs    01/26/24 1004  NA 140  K 4.6  CL 104  CO2 20  GLUCOSE 87  BUN 14  CREATININE 1.14  CALCIUM 9.4   Liver Function Tests: Recent Labs    01/26/24 1004  AST 18  ALT 18  ALKPHOS 70  BILITOT 0.8  PROT 6.5  ALBUMIN 4.3   No results for input(s): LIPASE, AMYLASE in the last 8760 hours. No results for input(s): AMMONIA in the last 8760 hours. CBC: Recent Labs    01/26/24 1004  WBC 7.2  NEUTROABS 4.3  HGB 16.0  HCT 47.5  MCV 96  PLT 211   Lipid Panel: No results for input(s): CHOL, HDL, LDLCALC, TRIG, CHOLHDL, LDLDIRECT in the last 8760 hours. TSH: No results for input(s): TSH in the last 8760 hours. A1C: Lab Results  Component Value Date   HGBA1C 5.5 06/20/2021    Assessment & Plan Other fatigue Pt reports tired Poor appetite Bp on the lower side  Will check labs Orders:   CBC w/Diff   Basic Metabolic Panel (BMET)   TSH  Acute cough No wheezing, but used albuterol  this morning Productive cough  Afebrile Will check labs Will get x ray chest  Orders:   DG Chest 2 View; Future  Acute non-recurrent maxillary sinusitis Maxillary sinus tenderness Will send augmentin   Use claritin for runny nose   Orders:   DG Chest 2 View; Future  Dizziness Pt c/o feeling tired and dizzy  Will check ortho stasis neg Will check bmp Instructed  to stop hydrocodone  cough syrup           [1]  Allergies Allergen Reactions   Sesame Oil Itching and Swelling   Tobramycin Itching and  Swelling    Used in an eye oint-reaction  [2]  Social History Tobacco Use   Smoking status: Never    Passive exposure: Never   Smokeless tobacco: Never  Vaping Use   Vaping status: Never Used  Substance Use Topics   Alcohol use: Yes    Alcohol/week: 21.0 standard drinks of alcohol    Types: 21 Cans of beer per week    Comment: beer- 0-4 beers day sometimes   Drug use: Never  [3]  Allergies Allergen Reactions   Sesame Oil Itching and Swelling   Tobramycin Itching and Swelling    Used in an eye oint-reaction   "

## 2024-04-18 NOTE — Assessment & Plan Note (Addendum)
 No wheezing, but used albuterol  this morning Productive cough  Afebrile Will check labs Will get x ray chest  Orders:   DG Chest 2 View; Future

## 2024-04-18 NOTE — Telephone Encounter (Signed)
 Patient shows scheduled with outside provider [facility] today 04/18/2024

## 2024-04-18 NOTE — Telephone Encounter (Signed)
 FYI Only or Action Required?: Action required by provider: request for appointment.  Patient was last seen in primary care on 04/13/2024 by Alvia Bring, DO.  Called Nurse Triage reporting Fatigue.  Symptoms began several weeks ago.  Interventions attempted: Rest, hydration, or home remedies.  Symptoms are: unchanged.Pt. Has been seen for flu. Still has weakness, which he is concerned about.Eating and drinking well. Still has cough, night sweats.  Triage Disposition: See HCP Within 4 Hours (Or PCP Triage)  Patient/caregiver understands and will follow disposition?: Yes      Copied from CRM [id number]. Topic: Clinical - Red Word Triage >> Apr 18, 2024  8:31 AM Anairis L wrote: Red Word that prompted transfer to Nurse Triage: ER visit 04/16/2024, Can't walk, weak, low grade fever, horrible cough. Feeling off. Reason for Disposition  [1] MODERATE weakness (e.g., interferes with work, school, normal activities) AND [2] cause unknown  (Exceptions: Weakness from acute minor illness or poor fluid intake; weakness is chronic and not worse.)  Answer Assessment - Initial Assessment Questions 1. DESCRIPTION: Describe how you are feeling.     weak 2. SEVERITY: How bad is it?  Can you stand and walk?     moderate 3. ONSET: When did these symptoms begin? (e.g., hours, days, weeks, months)     weeks 4. CAUSE: What do you think is causing the weakness or fatigue? (e.g., not drinking enough fluids, medical problem, trouble sleeping)     Dehydration, in ED 04/16/24 5. NEW MEDICINES:  Have you started on any new medicines recently? (e.g., opioid pain medicines, benzodiazepines, muscle relaxants, antidepressants, antihistamines, neuroleptics, beta blockers)     yes 6. OTHER SYMPTOMS: Do you have any other symptoms? (e.g., chest pain, fever, cough, SOB, vomiting, diarrhea, bleeding, other areas of pain)     no 7. PREGNANCY: Is there any chance you are pregnant? When was your last  menstrual period?     N/a  Protocols used: Weakness (Generalized) and Fatigue-A-AH

## 2024-04-19 ENCOUNTER — Ambulatory Visit: Payer: Self-pay | Admitting: Sports Medicine

## 2024-05-06 ENCOUNTER — Other Ambulatory Visit: Payer: Self-pay | Admitting: Pulmonary Disease

## 2024-12-12 ENCOUNTER — Ambulatory Visit
